# Patient Record
Sex: Female | Born: 1960 | Race: White | Hispanic: No | Smoking: Current every day smoker
Health system: Southern US, Community
[De-identification: ages and names within clinical notes are randomized; demographics above are authoritative.]

## PROBLEM LIST (undated history)

## (undated) DIAGNOSIS — I639 Cerebral infarction, unspecified: Secondary | ICD-10-CM

## (undated) DIAGNOSIS — E785 Hyperlipidemia, unspecified: Secondary | ICD-10-CM

## (undated) DIAGNOSIS — E119 Type 2 diabetes mellitus without complications: Secondary | ICD-10-CM

---

## 2010-04-25 ENCOUNTER — Encounter: Payer: Self-pay | Admitting: Family Medicine

## 2016-11-14 ENCOUNTER — Encounter (HOSPITAL_COMMUNITY): Payer: Self-pay | Admitting: Nurse Practitioner

## 2016-11-14 ENCOUNTER — Emergency Department (HOSPITAL_COMMUNITY): Payer: Self-pay

## 2016-11-14 ENCOUNTER — Other Ambulatory Visit: Payer: Self-pay

## 2016-11-14 ENCOUNTER — Emergency Department (HOSPITAL_COMMUNITY)
Admission: EM | Admit: 2016-11-14 | Discharge: 2016-11-14 | Discharge status: Against Medical Advice | Payer: Self-pay | Attending: Emergency Medicine | Admitting: Emergency Medicine

## 2016-11-14 DIAGNOSIS — R4781 Slurred speech: Secondary | ICD-10-CM

## 2016-11-14 DIAGNOSIS — I639 Cerebral infarction, unspecified: Secondary | ICD-10-CM | POA: Insufficient documentation

## 2016-11-14 DIAGNOSIS — F17210 Nicotine dependence, cigarettes, uncomplicated: Secondary | ICD-10-CM | POA: Insufficient documentation

## 2016-11-14 DIAGNOSIS — Z79899 Other long term (current) drug therapy: Secondary | ICD-10-CM | POA: Insufficient documentation

## 2016-11-14 DIAGNOSIS — I7774 Dissection of vertebral artery: Secondary | ICD-10-CM | POA: Insufficient documentation

## 2016-11-14 HISTORY — DX: Cerebral infarction, unspecified: I63.9

## 2016-11-14 LAB — PROTIME-INR
INR: 1.03
Prothrombin Time: 13.5 seconds (ref 11.4–15.2)

## 2016-11-14 LAB — COMPREHENSIVE METABOLIC PANEL
ALT: 16 U/L (ref 14–54)
AST: 18 U/L (ref 15–41)
Albumin: 4 g/dL (ref 3.5–5.0)
Alkaline Phosphatase: 79 U/L (ref 38–126)
Anion gap: 10 (ref 5–15)
BILIRUBIN TOTAL: 0.6 mg/dL (ref 0.3–1.2)
BUN: 13 mg/dL (ref 6–20)
CO2: 24 mmol/L (ref 22–32)
CREATININE: 0.63 mg/dL (ref 0.44–1.00)
Calcium: 9.1 mg/dL (ref 8.9–10.3)
Chloride: 106 mmol/L (ref 101–111)
GFR calc Af Amer: >60 mL/min (ref >60)
GLUCOSE: 115 mg/dL — AB (ref 65–99)
Potassium: 3.5 mmol/L (ref 3.5–5.1)
Sodium: 140 mmol/L (ref 135–145)
TOTAL PROTEIN: 7.6 g/dL (ref 6.5–8.1)

## 2016-11-14 LAB — DIFFERENTIAL
BASOS ABS: 0.1 10*3/uL (ref 0.0–0.1)
Basophils Relative: 0 %
Eosinophils Absolute: 0.1 10*3/uL (ref 0.0–0.7)
Eosinophils Relative: 0 %
LYMPHS ABS: 5 10*3/uL — AB (ref 0.7–4.0)
LYMPHS PCT: 28 %
MONOS PCT: 7 %
Monocytes Absolute: 1.2 10*3/uL — ABNORMAL HIGH (ref 0.1–1.0)
NEUTROS ABS: 11.7 10*3/uL — AB (ref 1.7–7.7)
Neutrophils Relative %: 65 %

## 2016-11-14 LAB — I-STAT CHEM 8, ED
BUN: 14 mg/dL (ref 6–20)
CREATININE: 0.6 mg/dL (ref 0.44–1.00)
Calcium, Ion: 1.02 mmol/L — ABNORMAL LOW (ref 1.15–1.40)
Chloride: 106 mmol/L (ref 101–111)
GLUCOSE: 115 mg/dL — AB (ref 65–99)
HCT: 44 % (ref 36.0–46.0)
HEMOGLOBIN: 15 g/dL (ref 12.0–15.0)
POTASSIUM: 3.6 mmol/L (ref 3.5–5.1)
Sodium: 142 mmol/L (ref 135–145)
TCO2: 25 mmol/L (ref 0–100)

## 2016-11-14 LAB — CBC
HEMATOCRIT: 43.6 % (ref 36.0–46.0)
HEMOGLOBIN: 15 g/dL (ref 12.0–15.0)
MCH: 30.4 pg (ref 26.0–34.0)
MCHC: 34.4 g/dL (ref 30.0–36.0)
MCV: 88.4 fL (ref 78.0–100.0)
Platelets: 242 10*3/uL (ref 150–400)
RBC: 4.93 MIL/uL (ref 3.87–5.11)
RDW: 13 % (ref 11.5–15.5)
WBC: 18 10*3/uL — ABNORMAL HIGH (ref 4.0–10.5)

## 2016-11-14 LAB — I-STAT TROPONIN, ED: TROPONIN I, POC: 0 ng/mL (ref 0.00–0.08)

## 2016-11-14 LAB — APTT: aPTT: 31 seconds (ref 24–36)

## 2016-11-14 MED ORDER — ENOXAPARIN SODIUM 150 MG/ML ~~LOC~~ SOLN: 1.0000 mg/kg | Freq: Two times a day (BID) | SUBCUTANEOUS | 0 refills | Status: DC

## 2016-11-14 MED ORDER — WARFARIN SODIUM 5 MG PO TABS: 5.0000 mg | ORAL_TABLET | Freq: Once | ORAL | 0 refills | Status: DC

## 2016-11-14 MED ORDER — IOPAMIDOL (ISOVUE-370) INJECTION 76%
INTRAVENOUS | Status: AC
  Administered 2016-11-14: 50 mL
  Filled 2016-11-14: qty 50

## 2016-11-14 MED ORDER — ENOXAPARIN SODIUM 100 MG/ML ~~LOC~~ SOLN
1.0000 mg/kg | Freq: Once | SUBCUTANEOUS | Status: AC
  Administered 2016-11-14: 85 mg via SUBCUTANEOUS
  Filled 2016-11-14: qty 1

## 2016-11-14 NOTE — ED Notes (Signed)
Dr. Criss AlvineGoldston ( EDP ) explained tests results , plan of care and admission plan to pt. and family . Pt. refused admission and requested to be discharged home during encounter.

## 2016-11-14 NOTE — ED Provider Notes (Signed)
[facility]-EMERGENCY DEPT Provider Note   CSN: [id number] Arrival date & time: 11/14/16  1107     History   Chief Complaint Chief Complaint  Patient presents with   Stroke Symptoms    HPI Alexandra Ward is a 56 y.o. female.  HPI Patient with history of tobacco use who presents with symptoms concerning for stroke. Patient was admitted to [facility] rocking him on Friday evening after being diagnosed with a stroke. She reports headache starting on Tuesday, and developed slurred speech on Friday at approximately 6 PM. She's had some intermittent tingling of the right side of her mouth. She recalls having a CT and an MRI that showed her stroke. However she became frustrated the following day that no further tests are being done, and she left AGAINST MEDICAL ADVICE. Her friends were concerned about her symptoms, and convinced her to come back to the emergency department this evening. She has had some intermittent slurred speech and right lip tingling, but denies any new symptoms. Past Medical History:  Diagnosis Date   Stroke [facility])     There are no active problems to display for this patient.   History reviewed. No pertinent surgical history.  OB History    No data available       Home Medications    Prior to Admission medications   Medication Sig Start Date End Date Taking? Authorizing Provider  FISH OIL-KRILL OIL PO Take 1 capsule by mouth daily.   Yes [provider]  ibuprofen (ADVIL,MOTRIN) 200 MG tablet Take 200 mg by mouth every 6 (six) hours as needed for moderate pain.   Yes [provider]  Multiple Vitamin (MULTIVITAMIN) tablet Take 1 tablet by mouth daily.   Yes [provider]  enoxaparin (LOVENOX) 150 MG/ML injection Inject 0.57 mLs (85 mg total) into the skin every 12 (twelve) hours. 11/14/16 11/21/16  Wynelle Cleveland, MD  warfarin (COUMADIN) 5 MG tablet Take 1 tablet (5 mg total) by mouth one time only at 6 PM. 11/14/16 11/21/16  Wynelle Cleveland, MD     Family History History reviewed. No pertinent family history.  Social History Social History  Substance Use Topics   Smoking status: Current Every Day Smoker    Types: Cigarettes   Smokeless tobacco: Never Used   Alcohol use No     Allergies   Hydrocodone; Other; and Tyloxapol   Review of Systems Review of Systems  Constitutional: Negative for chills and fever.  HENT: Negative for ear pain and sore throat.   Eyes: Negative for pain and visual disturbance.  Respiratory: Negative for cough and shortness of breath.   Cardiovascular: Negative for chest pain and palpitations.  Gastrointestinal: Negative for abdominal pain and vomiting.  Genitourinary: Negative for dysuria and hematuria.  Musculoskeletal: Negative for arthralgias and back pain.  Skin: Negative for color change and rash.  Neurological: Positive for speech difficulty, numbness and headaches. Negative for seizures and syncope.  All other systems reviewed and are negative.    Physical Exam Updated Vital Signs BP 108/61    Pulse 76    Temp 98.2 F (36.8 C)    Resp (!) 23    Ht 5\' 4"  (1.626 m)    Wt 86.2 kg (190 lb)    SpO2 95%    BMI 32.61 kg/m   Physical Exam  Constitutional: She is oriented to person, place, and time. She appears well-developed and well-nourished. No distress.  HENT:  Head: Normocephalic and atraumatic.  Eyes: Conjunctivae are normal.  Neck:  Neck supple.  Cardiovascular: Normal rate and regular rhythm.   No murmur heard. Pulmonary/Chest: Effort normal and breath sounds normal. No respiratory distress.  Abdominal: Soft. There is no tenderness.  Musculoskeletal: She exhibits no edema.  Neurological: She is alert and oriented to person, place, and time. She has normal strength. She displays a negative Romberg sign. Coordination and gait normal. GCS eye subscore is 4. GCS verbal subscore is 5. GCS motor subscore is 6.  ? Facial droop on right compared to left; patient reports history of  slanted smile and does not think there is a change  Skin: Skin is warm and dry.  Psychiatric: She has a normal mood and affect.  Nursing note and vitals reviewed.    ED Treatments / Results  Labs (all labs ordered are listed, but only abnormal results are displayed) Labs Reviewed  CBC - Abnormal; Notable for the following:       Result Value   WBC 18.0 (*)    All other components within normal limits  DIFFERENTIAL - Abnormal; Notable for the following:    Neutro Abs 11.7 (*)    Lymphs Abs 5.0 (*)    Monocytes Absolute 1.2 (*)    All other components within normal limits  COMPREHENSIVE METABOLIC PANEL - Abnormal; Notable for the following:    Glucose, Bld 115 (*)    All other components within normal limits  I-STAT CHEM 8, ED - Abnormal; Notable for the following:    Glucose, Bld 115 (*)    Calcium, Ion 1.02 (*)    All other components within normal limits  PROTIME-INR  APTT  I-STAT TROPONIN, ED    EKG  EKG Interpretation  Date/Time:  Monday November 14 2016 11:21:53 EDT Ventricular Rate:  81 PR Interval:  140 QRS Duration: 98 QT Interval:  404 QTC Calculation: 469 R Axis:   82 Text Interpretation:  Normal sinus rhythm nonspecific T wave changes No old tracing to compare Confirmed by Pricilla Loveless [phone]) on 11/14/2016 3:23:56 PM       Radiology Ct Angio Head W Or Wo Contrast  Result Date: 11/14/2016 CLINICAL DATA:  Recent stroke.  Right facial droop and lip numbness. EXAM: CT ANGIOGRAPHY HEAD AND NECK TECHNIQUE: Multidetector CT imaging of the head and neck was performed using the standard protocol during bolus administration of intravenous contrast. Multiplanar CT image reconstructions and MIPs were obtained to evaluate the vascular anatomy. Carotid stenosis measurements (when applicable) are obtained utilizing NASCET criteria, using the distal internal carotid diameter as the denominator. CONTRAST:  50 mL Isovue 370 COMPARISON:  None. FINDINGS: CT HEAD FINDINGS  Brain: There is focal hypoattenuation within the posterior left middle cerebral artery territory extending to the parietal cortex. No hemorrhage. No midline shift or other mass effect. There is nearby sulcal effacement. No hydrocephalus. No age advanced atrophy. Vascular: No hyperdense vessel or unexpected calcification. Skull: Normal visualized skull base, calvarium and extracranial soft tissues. Sinuses/Orbits: No sinus fluid levels or advanced mucosal thickening. No mastoid effusion. Normal orbits. CTA NECK FINDINGS Aortic arch: There is no aneurysm or dissection of the visualized ascending aorta or aortic arch. There is a normal variant aortic arch branching pattern with the brachiocephalic and left common carotid arteries sharing a common origin. The visualized proximal subclavian arteries are normal. Mild atherosclerotic calcification. Right carotid system: The right common carotid origin is widely patent. There is no common carotid or internal carotid artery dissection or aneurysm. Mild atherosclerotic calcification at the carotid bifurcation without hemodynamically significant  stenosis. Left carotid system: The left common carotid origin is widely patent. There is no common carotid or internal carotid artery dissection or aneurysm. Mild atherosclerotic calcification at the carotid bifurcation without hemodynamically significant stenosis. Vertebral arteries: The vertebral system is right dominant. The left vertebral artery is diminutive along its entire course and is non-opacified in its V4 segment proximal to the basilar confluence. The left transverse foramina are of normal caliber (arguing against congenital variation). The right vertebral artery is normal along its entire course. Skeleton: There is no bony spinal canal stenosis. No lytic or blastic lesions. Other neck: The nasopharynx is clear. The oropharynx and hypopharynx are normal. The epiglottis is normal. The supraglottic larynx, glottis and  subglottic larynx are normal. No retropharyngeal collection. The parapharyngeal spaces are preserved. The parotid and submandibular glands are normal. No sialolithiasis or salivary ductal dilatation. The thyroid gland is normal. There is no cervical lymphadenopathy. Upper chest: No pneumothorax or pleural effusion. No nodules or masses. Review of the MIP images confirms the above findings CTA HEAD FINDINGS Anterior circulation: --Intracranial internal carotid arteries: Approximately 50% narrowing of the distal cavernous segment of the right internal carotid artery. Minimal calcification the proximal left clinoid segment. Otherwise normal. --Anterior cerebral arteries: Normal. --Middle cerebral arteries: Moderate stenosis of the proximal left M2 segment superior division. Normal right MCA. --Posterior communicating arteries: Present bilaterally. Posterior circulation: --Posterior cerebral arteries: Bilateral fetal origins. Diminutive bilateral P1 segments. --Superior cerebellar arteries: Normal. --Basilar artery: Normal. --Anterior inferior cerebellar arteries: Normal. --Posterior inferior cerebellar arteries: Both posterior inferior cerebellar arteries arise from the perspective anterior inferior cerebellar arteries. Venous sinuses: As permitted by contrast timing, patent. Anatomic variants: Bilateral fetal origin posterior cerebral arteries. Bilateral shared AICA-PICA origins. Delayed phase: No parenchymal contrast enhancement. Review of the MIP images confirms the above findings IMPRESSION: 1. Focal hypoattenuation within the left parietal lobe, in the posterior left middle cerebral artery territory vs left MCA/PCA watershed, consistent with subacute infarct (5-10 days). No hemorrhage or mass effect. 2. Diffuse narrowing and attenuated enhancement of the left vertebral artery, suggesting acute vertebral artery dissection. The distal V4 segment is occluded. Of note, the the left PICA arises from a common origin  with the left AICA from the basilar artery and remains patent. 3. 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 Approximately 50% stenosis of the distal cavernous segment of the right internal carotid artery. Critical Value/emergent results were called by telephone at the time of interpretation on 11/14/2016 at 8:32 pm to Dr. Wynelle ClevelandKATHRYN Kaizlee Carlino , who verbally acknowledged these results. Electronically Signed   By: Deatra RobinsonKevin  Herman M.D.   On: 11/14/2016 20:37   Ct Angio Neck W And/or Wo Contrast  Result Date: 11/14/2016 CLINICAL DATA:  Recent stroke.  Right facial droop and lip numbness. EXAM: CT ANGIOGRAPHY HEAD AND NECK TECHNIQUE: Multidetector CT imaging of the head and neck was performed using the standard protocol during bolus administration of intravenous contrast. Multiplanar CT image reconstructions and MIPs were obtained to evaluate the vascular anatomy. Carotid stenosis measurements (when applicable) are obtained utilizing NASCET criteria, using the distal internal carotid diameter as the denominator. CONTRAST:  50 mL Isovue 370 COMPARISON:  None. FINDINGS: CT HEAD FINDINGS Brain: There is focal hypoattenuation within the posterior left middle cerebral artery territory extending to the parietal cortex. No hemorrhage. No midline shift or other mass effect. There is nearby sulcal effacement. No hydrocephalus. No age advanced atrophy. Vascular: No hyperdense vessel or unexpected calcification. Skull: Normal visualized skull  base, calvarium and extracranial soft tissues. Sinuses/Orbits: No sinus fluid levels or advanced mucosal thickening. No mastoid effusion. Normal orbits. CTA NECK FINDINGS Aortic arch: There is no aneurysm or dissection of the visualized ascending aorta or aortic arch. There is a normal variant aortic arch branching pattern with the brachiocephalic and left common carotid arteries sharing a common origin. The visualized proximal subclavian arteries are normal. Mild  atherosclerotic calcification. Right carotid system: The right common carotid origin is widely patent. There is no common carotid or internal carotid artery dissection or aneurysm. Mild atherosclerotic calcification at the carotid bifurcation without hemodynamically significant stenosis. Left carotid system: The left common carotid origin is widely patent. There is no common carotid or internal carotid artery dissection or aneurysm. Mild atherosclerotic calcification at the carotid bifurcation without hemodynamically significant stenosis. Vertebral arteries: The vertebral system is right dominant. The left vertebral artery is diminutive along its entire course and is non-opacified in its V4 segment proximal to the basilar confluence. The left transverse foramina are of normal caliber (arguing against congenital variation). The right vertebral artery is normal along its entire course. Skeleton: There is no bony spinal canal stenosis. No lytic or blastic lesions. Other neck: The nasopharynx is clear. The oropharynx and hypopharynx are normal. The epiglottis is normal. The supraglottic larynx, glottis and subglottic larynx are normal. No retropharyngeal collection. The parapharyngeal spaces are preserved. The parotid and submandibular glands are normal. No sialolithiasis or salivary ductal dilatation. The thyroid gland is normal. There is no cervical lymphadenopathy. Upper chest: No pneumothorax or pleural effusion. No nodules or masses. Review of the MIP images confirms the above findings CTA HEAD FINDINGS Anterior circulation: --Intracranial internal carotid arteries: Approximately 50% narrowing of the distal cavernous segment of the right internal carotid artery. Minimal calcification the proximal left clinoid segment. Otherwise normal. --Anterior cerebral arteries: Normal. --Middle cerebral arteries: Moderate stenosis of the proximal left M2 segment superior division. Normal right MCA. --Posterior communicating  arteries: Present bilaterally. Posterior circulation: --Posterior cerebral arteries: Bilateral fetal origins. Diminutive bilateral P1 segments. --Superior cerebellar arteries: Normal. --Basilar artery: Normal. --Anterior inferior cerebellar arteries: Normal. --Posterior inferior cerebellar arteries: Both posterior inferior cerebellar arteries arise from the perspective anterior inferior cerebellar arteries. Venous sinuses: As permitted by contrast timing, patent. Anatomic variants: Bilateral fetal origin posterior cerebral arteries. Bilateral shared AICA-PICA origins. Delayed phase: No parenchymal contrast enhancement. Review of the MIP images confirms the above findings IMPRESSION: 1. Focal hypoattenuation within the left parietal lobe, in the posterior left middle cerebral artery territory vs left MCA/PCA watershed, consistent with subacute infarct (5-10 days). No hemorrhage or mass effect. 2. Diffuse narrowing and attenuated enhancement of the left vertebral artery, suggesting acute vertebral artery dissection. The distal V4 segment is occluded. Of note, the the left PICA arises from a common origin with the left AICA from the basilar artery and remains patent. 3. 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 Approximately 50% stenosis of the distal cavernous segment of the right internal carotid artery. Critical Value/emergent results were called by telephone at the time of interpretation on 11/14/2016 at 8:32 pm to Dr. Wynelle Cleveland , who verbally acknowledged these results. Electronically Signed   By: Deatra Robinson M.D.   On: 11/14/2016 20:37    Procedures Procedures (including critical care time)  Medications Ordered in ED Medications  iopamidol (ISOVUE-370) 76 % injection (50 mLs  Contrast Given 11/14/16 1923)  enoxaparin (LOVENOX) injection 85 mg (85 mg Subcutaneous Given 11/14/16 2123)  Initial Impression / Assessment and Plan / ED Course  I have reviewed the  triage vital signs and the nursing notes.  Pertinent labs & imaging results that were available during my care of the patient were reviewed by me and considered in my medical decision making (see chart for details).    Patient is a 56 year old female who presents with recent stroke. Patient arrived hemodynamically stable, in no acute distress. Exam as above.  Medical records from [place] received, CT head significant for left recent nonhemorrhagic watershed infarct at the junction of the left posterior parietal and occipital lobes, likely representing stigmata of a hypotensive cerebral infarction. Per discharge summary, patient was admitted from the emergency Department, however left AMA prior to admission. It does not appear that MRI or any ultrasounds were performed.  Discussed with neurology, recommending Additional imaging with CTA of the head and neck, MRI, and admission for additional stroke workup. Patient very anxious on exam, from my initial interview expressed that she did not want to be admitted to the hospital. She had to be coaxed into getting IV for her head imaging. When discussed the results of her left vertebral artery dissection and acute stroke, patient became very upset, requesting to leave AMA. Myself, my attending, and the neurologist on-call spent prolonged time discussing these results with the patient, the risk of leaving without the rest of her workup being completed, including risk of worsening neurological symptoms and even death. Patient was able to verbalize understanding of this risk. She did agree to wait for Lovenox training. She received prescription for 1 week of Lovenox bridge and Coumadin for her vertebral artery dissection. I discussed strict return precautions, follow-up with her primary care doctor this week, and reminded her she could always come back to the emergency room to be admitted if she changed her mind.  Patient signed out AGAINST MEDICAL  ADVICE.  Patient and plan of care discussed with Attending physician, Dr. Criss Alvine.   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

## 2016-11-14 NOTE — Consult Note (Addendum)
Neurology Consultation Reason for Consult: Stroke Referring Physician: Marcina Millard  CC: Dysarthria   HPI: Alexandra Ward is a 56 y.o. female overwieght female, smoker with who likely has HTN comes to the ER after having developed slurred speech, facial droop on Friday. Went to OSH and left AMA as she was frustrated no further tests were being done. Apparently CT head at OSH showed subacute stroke.  She comes back as symptoms not improved. However patient is now refusing tests/admission.   LKW: 11/11/16 tpa given?: no, outside window Premorbid modified rankin scale: Baseline 0, now 1    ROS: A 14 point ROS was performed and is negative except as noted in the HPI.  Past Medical History:  Diagnosis Date   Stroke [facility])      History reviewed. No pertinent family history.   Social History:  reports that she has been smoking Cigarettes.  She has never used smokeless tobacco. She reports that she does not drink alcohol or use drugs.   Exam: Current vital signs: BP 108/61    Pulse 76    Temp 98.2 F (36.8 C)    Resp (!) 23    Ht 5\' 4"  (1.626 m)    Wt 86.2 kg (190 lb)    SpO2 95%    BMI 32.61 kg/m  Vital signs in last 24 hours: Temp:  [98.2 F (36.8 C)-98.4 F (36.9 C)] 98.2 F (36.8 C) (08/13 2007) Pulse Rate:  [64-88] 76 (08/13 2015) Resp:  [12-23] 23 (08/13 2015) BP: (97-165)/(58-85) 108/61 (08/13 2015) SpO2:  [92 %-99 %] 95 % (08/13 2015) Weight:  [86.2 kg (190 lb)] 86.2 kg (190 lb) (08/13 1117)   Physical Exam  Constitutional: Appears well-developed and well-nourished.  Psych: Affect appropriate to situation Eyes: No scleral injection HENT: No OP obstrucion Head: Normocephalic.  Cardiovascular: Normal rate and regular rhythm.  Respiratory: Effort normal and breath sounds normal to anterior ascultation GI: Soft.  No distension. There is no tenderness.  Skin: WDI  Neuro: Mental Status: Patient is awake, alert, oriented to person, place, month, year, and  situation. Patient is able to give a clear and coherent history No signs of aphasia or neglect, however patient is dysarthric  Cranial Nerves: II: Visual Fields are full. Pupils are equal, round, and reactive to light.   III,IV, VI: EOMI without ptosis or diploplia.  V: Facial sensation is symmetric to temperature VII: R NF flattening VIII: hearing is intact to voice X: Uvula elevates symmetrically XI: Shoulder shrug is symmetric. XII: tongue is midline without atrophy or fasciculations.  Motor: Tone is normal. Bulk is normal. 5/5 strength was present in all four extremities.  Sensory: Sensation is symmetric to light touch and temperature in the arms and legs. Deep Tendon Reflexes: 2+ and symmetric in the biceps and patellae.  Plantars: Toes are downgoing bilaterally.  Cerebellar: FNF and HKS are intact bilaterally  ASSESSMENT AND PLAN  Subacute Ischemic Stroke   58 y F overwieght female, smoker with who likely has HTN comes to the ER after having developed slurred speech, facial droop on Friday. Went to OSH and left AMA.  Recommend admission for stroke workup. However patient is now refusing tests/admission. Spent over 20 min counseling on need to diagnose mechanism of stroke for targeted stroke prevention therapy. Suspect underlying family issues.   I will sign out care to Dr Otelia Limes.    ##################################################################  This is addendum from yesterday's note.  I was very sad to see that patient left AMA after obtaining  CTA which was showing possible left vertebral dissection and left M2 stenosis. Patient is at a very high risk for recurrent stroke. Hopefully she comes to her senses and returns to hospital.  My colleague started patient on warfarin and lovenox for vertebral dissection, if she is taking these medications, she absolutely needs to follow up with PCP. We will be very happy to take care of her if she returns.

## 2016-11-14 NOTE — ED Notes (Signed)
Patient refused discharge vital signs to be taken at discharge .

## 2016-11-14 NOTE — ED Triage Notes (Signed)
Pt presents with c/o stroke symptoms. [facility] for a [facility]. She left against medical advice on saturday because she felt that she was not receiving any care for her stroke. She decided to return to the hospital for care today because she continues to have slurred speech and numbness in her lips. She feels that her symptoms are improving but slowly and would like more help in managing her symptoms. She denies any weakness, pain, confusion.

## 2016-11-14 NOTE — ED Provider Notes (Signed)
I saw and evaluated the patient, reviewed the resident's note and I agree with the findings and plan.   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 She is overall well appearing. However she is refusing to be admitted. She does understand risks of not being admitted with close monitoring and treatment such as death, permanent neurologic disability. She seems to understand the severity of these tests but refuses to come in. She also refused to explain why she wants to leave. However she is able to get up and walk and is aiming toward. She appears awake, alert, not intoxicated, and capable of medical decision-making. I discussed that she can return at any time to be reevaluated and admitted. Discussed with neurology, Dr. Otelia LimesLindzen, who originally recommended heparin and warfarin and now Lovenox and warfarin since she is leaving. I discussed side effects of these medicines, especially bleeding. She has a PCP and I discussed the strict importance of taking both medicines and following up with her PCP in the next 1-2 days. Discussed that if this is not probably monitored a can cause severe bleeding or poor effect. She understands this. Family at the bedside understands this. Multiple times she was offered admission and further treatment and she declined. Discussed return precautions.   Pricilla LovelessGoldston, Iyahna Obriant, MD 11/15/16 Marlyne Beards0002

## 2016-11-14 NOTE — ED Notes (Signed)
Pt states she will alolow me to place IV,

## 2016-11-14 NOTE — ED Notes (Signed)
Pt refuses IV. Dr. Criss AlvineGoldston aware.

## 2016-11-14 NOTE — ED Notes (Signed)
Patient transported to CT scan .

## 2016-11-14 NOTE — ED Notes (Signed)
Neuro at bedsdie

## 2016-11-14 NOTE — ED Notes (Signed)
IV held after discujss with Dr. Criss AlvineGoldston.

## 2016-11-14 NOTE — Discharge Instructions (Signed)
Your work up was significant for left vertebral artery dissection and acute stroke. Please follow up with PCM as instructed for follow up on your coumadin and INR dosing. You have only been given one week of treatment because of the importance of close follow up. Please return to ED with any new or worsening symptoms.   Understand by leaving AGAINST MEDICAL ADVICE, you accept the risk of worsening symptoms or even death.

## 2016-11-14 NOTE — ED Notes (Signed)
Pt. signed AMA form , waiting for her Lovenox from pharmacy .

## 2016-11-23 ENCOUNTER — Inpatient Hospital Stay (HOSPITAL_COMMUNITY)
Admission: EM | Admit: 2016-11-23 | Discharge: 2016-11-24 | DRG: 064 | Discharge status: Against Medical Advice | Payer: Self-pay | Attending: Internal Medicine | Admitting: Internal Medicine

## 2016-11-23 ENCOUNTER — Encounter (HOSPITAL_COMMUNITY): Payer: Self-pay | Admitting: *Deleted

## 2016-11-23 ENCOUNTER — Emergency Department (HOSPITAL_COMMUNITY): Payer: Self-pay

## 2016-11-23 DIAGNOSIS — R739 Hyperglycemia, unspecified: Secondary | ICD-10-CM | POA: Diagnosis present

## 2016-11-23 DIAGNOSIS — I639 Cerebral infarction, unspecified: Secondary | ICD-10-CM

## 2016-11-23 DIAGNOSIS — F17210 Nicotine dependence, cigarettes, uncomplicated: Secondary | ICD-10-CM | POA: Diagnosis present

## 2016-11-23 DIAGNOSIS — R4701 Aphasia: Secondary | ICD-10-CM | POA: Diagnosis present

## 2016-11-23 DIAGNOSIS — Z720 Tobacco use: Secondary | ICD-10-CM | POA: Diagnosis present

## 2016-11-23 DIAGNOSIS — I7774 Dissection of vertebral artery: Secondary | ICD-10-CM

## 2016-11-23 DIAGNOSIS — Z8249 Family history of ischemic heart disease and other diseases of the circulatory system: Secondary | ICD-10-CM

## 2016-11-23 DIAGNOSIS — I63512 Cerebral infarction due to unspecified occlusion or stenosis of left middle cerebral artery: Secondary | ICD-10-CM

## 2016-11-23 DIAGNOSIS — R29700 NIHSS score 0: Secondary | ICD-10-CM | POA: Diagnosis present

## 2016-11-23 DIAGNOSIS — I6521 Occlusion and stenosis of right carotid artery: Secondary | ICD-10-CM | POA: Diagnosis present

## 2016-11-23 LAB — CBC WITH DIFFERENTIAL/PLATELET
BASOS PCT: 0 %
Basophils Absolute: 0.1 10*3/uL (ref 0.0–0.1)
EOS ABS: 0.1 10*3/uL (ref 0.0–0.7)
EOS PCT: 1 %
HCT: 40 % (ref 36.0–46.0)
HEMOGLOBIN: 13.6 g/dL (ref 12.0–15.0)
LYMPHS ABS: 4.1 10*3/uL — AB (ref 0.7–4.0)
Lymphocytes Relative: 28 %
MCH: 30 pg (ref 26.0–34.0)
MCHC: 34 g/dL (ref 30.0–36.0)
MCV: 88.3 fL (ref 78.0–100.0)
MONO ABS: 1.1 10*3/uL — AB (ref 0.1–1.0)
MONOS PCT: 8 %
NEUTROS PCT: 63 %
Neutro Abs: 9.3 10*3/uL — ABNORMAL HIGH (ref 1.7–7.7)
Platelets: 252 10*3/uL (ref 150–400)
RBC: 4.53 MIL/uL (ref 3.87–5.11)
RDW: 13.2 % (ref 11.5–15.5)
WBC: 14.7 10*3/uL — ABNORMAL HIGH (ref 4.0–10.5)

## 2016-11-23 LAB — BASIC METABOLIC PANEL
Anion gap: 6 (ref 5–15)
BUN: 11 mg/dL (ref 6–20)
CALCIUM: 9 mg/dL (ref 8.9–10.3)
CHLORIDE: 107 mmol/L (ref 101–111)
CO2: 26 mmol/L (ref 22–32)
CREATININE: 0.56 mg/dL (ref 0.44–1.00)
GFR calc non Af Amer: >60 mL/min (ref >60)
GLUCOSE: 164 mg/dL — AB (ref 65–99)
Potassium: 3.5 mmol/L (ref 3.5–5.1)
Sodium: 139 mmol/L (ref 135–145)

## 2016-11-23 LAB — PROTIME-INR
INR: 1.05
PROTHROMBIN TIME: 13.7 s (ref 11.4–15.2)

## 2016-11-23 LAB — TSH: TSH: 3.437 u[IU]/mL (ref 0.350–4.500)

## 2016-11-23 MED ORDER — SENNOSIDES-DOCUSATE SODIUM 8.6-50 MG PO TABS
1.0000 | ORAL_TABLET | Freq: Every evening | ORAL | Status: DC | PRN
  Filled 2016-11-23: qty 1

## 2016-11-23 MED ORDER — ACETAMINOPHEN 650 MG RE SUPP: 650.0000 mg | RECTAL | Status: DC | PRN

## 2016-11-23 MED ORDER — SODIUM CHLORIDE 0.9 % IV SOLN
INTRAVENOUS | Status: DC
  Administered 2016-11-23: 50 mL/h via INTRAVENOUS

## 2016-11-23 MED ORDER — ACETAMINOPHEN 325 MG PO TABS: 650.0000 mg | ORAL_TABLET | ORAL | Status: DC | PRN

## 2016-11-23 MED ORDER — GADOBENATE DIMEGLUMINE 529 MG/ML IV SOLN
18.0000 mL | Freq: Once | INTRAVENOUS | Status: AC | PRN
  Administered 2016-11-23: 18 mL via INTRAVENOUS

## 2016-11-23 MED ORDER — ACETAMINOPHEN 160 MG/5ML PO SOLN: 650.0000 mg | ORAL | Status: DC | PRN

## 2016-11-23 MED ORDER — HEPARIN (PORCINE) IN NACL 100-0.45 UNIT/ML-% IJ SOLN
900.0000 [IU]/h | INTRAMUSCULAR | Status: DC
  Administered 2016-11-23: 900 [IU]/h via INTRAVENOUS
  Filled 2016-11-23: qty 250

## 2016-11-23 MED ORDER — STROKE: EARLY STAGES OF RECOVERY BOOK
Freq: Once | Status: AC
  Administered 2016-11-23: 19:00:00
  Filled 2016-11-23: qty 1

## 2016-11-23 MED ORDER — NICOTINE 21 MG/24HR TD PT24: 21.0000 mg | MEDICATED_PATCH | Freq: Every day | TRANSDERMAL | Status: DC

## 2016-11-23 NOTE — ED Notes (Signed)
Patient transported to MRI on stretcher.

## 2016-11-23 NOTE — ED Notes (Signed)
Attempted to call report.

## 2016-11-23 NOTE — Consult Note (Signed)
Neurology Consultation  Reason for Consult: Vertebral artery dissection Referring Physician: Dr. Judd Lien  CC: Slurred speech, facial droop, confusion  History is obtained from: Patient, chart  HPI: Alexandra Ward is a 56 y.o. female who has a past medical history of a recent stroke in the left parietal lobe, and CT angiogram done at that point suggestive of left vertebral artery dissection, who was seen here at [facility] last week and left the hospital without getting a complete workup done, on Lovenox as a bridge to Coumadin for her dissection, who now really presents to the emergency room with complaints of slurred speech, facial droop and some confusion. She reports that she had symptoms of slurred speech and some facial droop that started sometime around Friday, 11/11/2016. She went to an outside hospital a week after the symptom onset, where a CT scan of the head was done and she was told that she possibly has a stroke and she needs workup. She left from that hospital with some frustration of nothing being done at the time. She comes to [facility] emergency room last week, and was seen by Dr. Laurence Slate, who got a CTA of the head and neck done which revealed a left vertebral artery dissection and as well as moderate left M2 superior division stenosis, and 50% stenosis of the distal cavernous segment of the right ICA. He recommended that she stay for a completed stroke workup as well as start anticoagulation in the hospital. She decided not to stay in the hospital. She was discharged from the ER with Lovenox as bridge to Coumadin. She was asked to come back after 7 days for an INR check, which she never made 2. Today she comes in because she has been feeling worse in terms of her slurred speech and also has been feeling a little confused at times.   LKW: 11/11/2016 tpa given?: no, outside time window Premorbid modified Rankin scale (mRS): 0  ROS: A 14 point ROS was performed and is negative except as  noted in the HPI.    Past Medical History:  Diagnosis Date   Stroke [facility])      No family history on file.    Social History:   reports that she has been smoking Cigarettes.  She has never used smokeless tobacco. She reports that she does not drink alcohol or use drugs. Denies alcohol use. Denies illicit drug use. Smokes 1 pack of cigarette a day.  Medications  Current Facility-Administered Medications:    heparin ADULT infusion 100 units/mL (25000 units/274mL sodium chloride 0.45%), 900 Units/hr, Intravenous, Continuous, Rumbarger, Faye Ramsay, RPH  Current Outpatient Prescriptions:    enoxaparin (LOVENOX) 150 MG/ML injection, Inject 0.57 mLs (85 mg total) into the skin every 12 (twelve) hours. (Patient not taking: Reported on 11/23/2016), Disp: 8 mL, Rfl: 0   warfarin (COUMADIN) 5 MG tablet, Take 1 tablet (5 mg total) by mouth one time only at 6 PM. (Patient not taking: Reported on 11/23/2016), Disp: 7 tablet, Rfl: 0  Exam: Current vital signs: BP 131/71 (BP Location: Right Arm)    Pulse 66    Temp 98.5 F (36.9 C) (Oral)    Resp 20    Ht 5\' 4"  (1.626 m)    Wt 86.2 kg (190 lb 0.6 oz)    SpO2 95%    BMI 32.62 kg/m  Vital signs in last 24 hours: Temp:  [98.5 F (36.9 C)] 98.5 F (36.9 C) (08/22 1056) Pulse Rate:  [66-80] 66 (08/22 1323) Resp:  [  18-20] 20 (08/22 1323) BP: (117-131)/(71) 131/71 (08/22 1323) SpO2:  [95 %] 95 % (08/22 1323) Weight:  [86.2 kg (190 lb 0.6 oz)] 86.2 kg (190 lb 0.6 oz) (08/22 1300)  GENERAL: Awake, alert in NAD HEENT: - Normocephalic and atraumatic, dry mm, no LN++, no Thyromegally LUNGS - Clear to auscultation bilaterally with no wheezes CV - S1S2 RRR, no m/r/g, equal pulses bilaterally. ABDOMEN - Soft, nontender, nondistended with normoactive BS Ext: warm, well perfused, intact peripheral pulses, no edema  NEURO:  Mental Status: AA&Ox3  Language: speech is clear.  Naming, repetition, fluency, and comprehension intact. Cranial Nerves:  PERRL__3_mm/brisk. EOMI, visual fields full, no facial asymmetry, facial sensation intact, hearing intact, tongue/uvula/soft palate midline, normal sternocleidomastoid and trapezius muscle strength. No evidence of tongue atrophy or fibrillations Motor: 5/5 all over Tone: is normal and bulk is normal Sensation- Intact to light touch bilaterally Coordination: FTN intact bilaterally, no ataxia in BLE. Gait- deferred  NIHSS  0   Labs I have reviewed labs in epic and the results pertinent to this consultation are:   CBC    Component Value Date/Time   WBC 18.0 (H) 11/14/2016 1121   RBC 4.93 11/14/2016 1121   HGB 15.0 11/14/2016 1206   HCT 44.0 11/14/2016 1206   PLT 242 11/14/2016 1121   MCV 88.4 11/14/2016 1121   MCH 30.4 11/14/2016 1121   MCHC 34.4 11/14/2016 1121   RDW 13.0 11/14/2016 1121   LYMPHSABS 5.0 (H) 11/14/2016 1121   MONOABS 1.2 (H) 11/14/2016 1121   EOSABS 0.1 11/14/2016 1121   BASOSABS 0.1 11/14/2016 1121    CMP     Component Value Date/Time   NA 142 11/14/2016 1206   K 3.6 11/14/2016 1206   CL 106 11/14/2016 1206   CO2 24 11/14/2016 1121   GLUCOSE 115 (H) 11/14/2016 1206   BUN 14 11/14/2016 1206   CREATININE 0.60 11/14/2016 1206   CALCIUM 9.1 11/14/2016 1121   PROT 7.6 11/14/2016 1121   ALBUMIN 4.0 11/14/2016 1121   AST 18 11/14/2016 1121   ALT 16 11/14/2016 1121   ALKPHOS 79 11/14/2016 1121   BILITOT 0.6 11/14/2016 1121   GFRNONAA >60 11/14/2016 1121   GFRAA >60 11/14/2016 1121    Lipid Panel  No results found for: CHOL, TRIG, HDL, CHOLHDL, VLDL, LDLCALC, LDLDIRECT   Imaging I have reviewed the images obtained:  CT-scan of the brain - Performed on 11/14/2016 shows hypodensity in the left parietal lobe CTA of the head and neck shows diffuse narrowing and decreased enhancement of the left vertebral artery, suggestive of acute dissection. This was before segment is occluded. Also seen is moderate stenosis of the superior division of the left MCA M2  branch. Moderate stenosis 50% of distal cavernous segment of the right internal carotid artery is well.  MRI examination of the brain-pending  Assessment:  56 year old woman with a past medical history of a recent stroke 2 weeks ago in the left parietal lobe and a left vertebral artery dissection seen on CTA, who signed out AGAINST MEDICAL ADVICE last week, returns with similar symptoms of dysarthria and facial droop, that have now essentially resolved. The new symptoms that brought her in more of most concern was some confusion. On my exam, her NIH stroke scale is 0. Next line her CTA is suggestive of left vertebral artery dissection. Her CT shows a established left parietal stroke. She was recommended to go on anti-coag ration with warfarin, with Lovenox as bridge. She took the lowinjections  and the warfarin for 7 days and then discontinued it. Currently she has normal PT and INR   Impression: Acute ischemic stroke Left vertebral artery dissection  Recommendations: -Recommend anti-coagulation with a heparin drip, no bolus PTT 55-70 because of fluctuating symptoms. -Discharge on Lovenox with Coumadin bridge. -Echo -telemetric -MRA head and neck -He would've an A1c and fasting lipid panel -Frequent neuro checks -MRI brain without contrast.  Stroke team will follow with you in the morning.  Milon Dikes, MD [facility] [phone]  If 7pm to 7am, please call on call as listed on AMION.

## 2016-11-23 NOTE — Progress Notes (Signed)
ANTICOAGULATION CONSULT NOTE - Initial Consult  Pharmacy Consult for heparin Indication: stroke  Allergies  Allergen Reactions   Hydrocodone Nausea And Vomiting   Other Other (See Comments)    States all pain medications make her very ill   Tyloxapol Nausea And Vomiting    Patient Measurements: Height: 5\' 4"  (162.6 cm) Weight: 190 lb 0.6 oz (86.2 kg) IBW/kg (Calculated) : 54.7 Heparin Dosing Weight: 73.7kg  Vital Signs: Temp: 98.5 F (36.9 C) (08/22 1056) Temp Source: Oral (08/22 1056) BP: 131/71 (08/22 1323) Pulse Rate: 66 (08/22 1323)  Labs: No results for input(s): HGB, HCT, PLT, APTT, LABPROT, INR, HEPARINUNFRC, HEPRLOWMOCWT, CREATININE, CKTOTAL, CKMB, TROPONINI in the last 72 hours.  Estimated Creatinine Clearance: 83.4 mL/min (by C-G formula based on SCr of 0.6 mg/dL).   Medical History: Past Medical History:  Diagnosis Date   Stroke [facility])     Medications:  Infusions:   heparin      Assessment: 69 yof presented to the ED with facial droop and slurred speech. She was recently diagnosed with L vertebral artery dissection with L sided parietl occipital stroke. Baseline CBC is pending but recently WNL. She was prescribed coumadin and warfarin the time of her stroke diagnosis but has not taken.   Goal of Therapy:  Heparin level 0.3-0.5 units/ml Monitor platelets by anticoagulation protocol: Yes   Plan:  Heparin gtt 900 units/hr - NO BOLUS Check an 8 hr heparin level Daily heparin level and CBC  Alexandra Ward, Alexandra Ward 11/23/2016,1:30 PM

## 2016-11-23 NOTE — Progress Notes (Signed)
Pt on telemetry monitoring. No noted distress.

## 2016-11-23 NOTE — H&P (Addendum)
History and physical                                                      Alexandra Ward  BTY:[id number]  DOB: 10-19-1960  DOA: 11/23/2016  PCP: Patient, No Pcp Per      Requesting physician: Dr Judd Lien  Reason for consultation: Acute stroke   History of Present llness   Alexandra Ward is an 56 y.o. female who was admitted to [facility] about 2 weeks back (11/11/2016) for slurred speech and some confusion along with mild unsteady gait. She had a head CT done there which was negative. She was not happy with the care provided there so she decided to leave the hospital the next day. She then came to [facility] ED 3 days later since she had persistent slurred speech and some confusion.  CTA of the head and neck was done showing  a left vertebral artery dissection and as well as moderate left M2 superior division stenosis, and 50% stenosis of the distal cavernous segment of the right ICA. She was recommended to stay in the hospital to complete stroke workup and start anticoagulation but signed out AMA. She was discharged on Lovenox bridging with Coumadin and instructed to return to the ED for an INR check but she never returned. Patient came to the ED today that she has persistent slurred speech and feeling somewhat confused occasionally. She denies any headache, blurred vision, dizziness, nausea, vomiting, weakness or numbness of her limbs or gait instability. No prior symptoms. No new medications. (Except for Lovenox and Coumadin).  In the ED vitals were stable. Blood work showed mild leukocytosis, normal chemistry and mildly elevated blood glucose.  Neuro hospitalist was consulted and recommended MRI of the brain, MRA head and neck. She was started on IV heparin and plan to be admitted to complete stroke workup.   Review Of Systems     As outlined in history of present  illness. 12 point review of systems otherwise negative.  No known past medical history  No known past surgical history   Social History   Social History  Substance Use Topics   Smoking status: Current Every Day Smoker    Types: Cigarettes   Smokeless tobacco: Never Used   Alcohol use No     Family History   Reports family history of heart disease on mother's side.  Medications   Prior to Admission medications   Medication Sig Start Date End Date Taking? Authorizing Provider  enoxaparin (LOVENOX) 150 MG/ML injection Inject 0.57 mLs (85 mg total) into the skin every 12 (twelve) hours. Patient not taking: Reported on 11/23/2016 11/14/16 11/21/16  Marcina Millard,  Samara Deist, MD  warfarin (COUMADIN) 5 MG tablet Take 1 tablet (5 mg total) by mouth one time only at 6 PM. Patient not taking: Reported on 11/23/2016 11/14/16 11/21/16  Wynelle Cleveland, MD    Anti-infectives    None      Scheduled Meds: Continuous Infusions:  heparin 900 Units/hr (11/23/16 1411)   PRN Meds:.  Allergies  Allergen Reactions   Hydrocodone Nausea And Vomiting   Other Other (See Comments)    States all pain medications make her very ill   Tyloxapol Nausea And Vomiting    Objective   Physical Exam  Vitals  Blood pressure (!) 151/68, pulse 79, temperature 98.5 F (36.9 C), temperature source Oral, resp. rate 16, height 5\' 4"  (1.626 m), weight 86.2 kg (190 lb 0.6 oz), SpO2 95 %.   General: Middle aged female lying in bed, appears irritable HEENT: No pallor, moist mucosa, supple neck,  Chest: Clear to auscultation bilaterally   CVS: Normal S1 and S2, no murmurs GI: Soft, nondistended, nontender Musculoskeletal :Warm, no edema CNS: Alert and oriented, normal strength and tone in all extremities, cranial nerves 2-12 intact, no facial droop or slurred speech  Data   CBC  Recent Labs Lab 11/23/16 1148  WBC 14.7*  HGB 13.6  HCT 40.0  PLT 252  MCV 88.3  MCH 30.0  MCHC 34.0  RDW 13.2   LYMPHSABS 4.1*  MONOABS 1.1*  EOSABS 0.1  BASOSABS 0.1   ------------------------------------------------------------------------------------------------------------------  Chemistries   Recent Labs Lab 11/23/16 1148  NA 139  K 3.5  CL 107  CO2 26  GLUCOSE 164*  BUN 11  CREATININE 0.56  CALCIUM 9.0   ------------------------------------------------------------------------------------------------------------------ estimated creatinine clearance is 83.4 mL/min (by C-G formula based on SCr of 0.56 mg/dL). ------------------------------------------------------------------------------------------------------------------ No results for input(s): TSH, T4TOTAL, T3FREE, THYROIDAB in the last 72 hours.  Invalid input(s): FREET3   Coagulation profile  Recent Labs Lab 11/23/16 1148  INR 1.05   ------------------------------------------------------------------------------------------------------------------- No results for input(s): DDIMER in the last 72 hours. -------------------------------------------------------------------------------------------------------------------  Cardiac Enzymes No results for input(s): CKMB, TROPONINI, MYOGLOBIN in the last 168 hours.  Invalid input(s): CK ------------------------------------------------------------------------------------------------------------------ Invalid input(s): POCBNP   ---------------------------------------------------------------------------------------------------------------  Urinalysis No results found for: COLORURINE, APPEARANCEUR, LABSPEC, PHURINE, GLUCOSEU, HGBUR, BILIRUBINUR, KETONESUR, PROTEINUR, UROBILINOGEN, NITRITE, LEUKOCYTESUR   Imaging    No results found.  EKG: Pending  Assessment & Paln   Acute/subacute ischemic stroke Also findings of left vertebral artery dissection on recent CT Admit to telemetry. Follow MRI brain/MRA head and neck. Started on IV heparin. 2-D echo, PT/OT eval, SLP  eval. Check lipid panel, hemoglobin A 1C. Add Lipitor 80 mg daily. Check TSH. Allow permissive blood pressure. Continue neuro checks.  Stroke team to evaluate in the morning  Tobacco abuse Counseled on cessation. Order nicotine patch.  Patient had signed out from the hospital twice in the past 2 weeks and threatened to sign out AMA again this afternoon. Finally decided to stay back. She is at high risk for leaving AMA.   Family Communication: friend at bedside  Consults: Neurology   Eddie North M.D on 11/23/2016 at 5:46 PM  Between 7am to 7pm - Pager - [phone]. After 7pm go to [email or web address] - password [email or web address]  [facility]  - Office  [phone]   Thank you for the consult, we will follow the patient with you in the Hospital.

## 2016-11-23 NOTE — ED Triage Notes (Signed)
Pt states she had a diagnosed stroke on 8/9 at [facility] and was seen here as well for slurred speech and facial, droop on 8/14 and left AMA per pt. Pt arrives here today for same symptoms of slurred speech and facial droop.

## 2016-11-23 NOTE — Progress Notes (Signed)
Patient refused to have dinamap blood pressure cuff for vital signs. Ilean Skill LPN

## 2016-11-23 NOTE — ED Provider Notes (Signed)
[facility]-EMERGENCY DEPT Provider Note   CSN: [id number] Arrival date & time: 11/23/16  1014     History   Chief Complaint Chief Complaint  Patient presents with   Aphasia    HPI Ariela Mochizuki is a 56 y.o. female.  Patient is a 28 rolled female who presents for evaluation of dizziness and difficulty speaking. She was seen here one week ago with similar symptoms and was found to have had a stroke. There was also the possibility of a vertebral artery dissection. She was to be admitted, however left AGAINST MEDICAL ADVICE. She returns today desiring completion of her workup. Her symptoms persist, however to somewhat a lesser degree. She still feels dizzy and feels as though she is having difficulty speaking. She denies any weakness of her arm or leg. She denies any visual disturbances.   The history is provided by the patient.    Past Medical History:  Diagnosis Date   Stroke [facility])     Patient Active Problem List   Diagnosis Date Noted   Acute ischemic stroke (HCC) 11/23/2016    No past surgical history on file.  OB History    No data available       Home Medications    Prior to Admission medications   Medication Sig Start Date End Date Taking? Authorizing Provider  enoxaparin (LOVENOX) 150 MG/ML injection Inject 0.57 mLs (85 mg total) into the skin every 12 (twelve) hours. Patient not taking: Reported on 11/23/2016 11/14/16 11/21/16  Wynelle Cleveland, MD  warfarin (COUMADIN) 5 MG tablet Take 1 tablet (5 mg total) by mouth one time only at 6 PM. Patient not taking: Reported on 11/23/2016 11/14/16 11/21/16  Wynelle Cleveland, MD    Family History No family history on file.  Social History Social History  Substance Use Topics   Smoking status: Current Every Day Smoker    Types: Cigarettes   Smokeless tobacco: Never Used   Alcohol use No     Allergies   Hydrocodone; Other; and Tyloxapol   Review of Systems Review of Systems  All other systems reviewed and are  negative.    Physical Exam Updated Vital Signs BP (!) 151/68    Pulse 79    Temp 98.5 F (36.9 C) (Oral)    Resp 18    Ht 5\' 4"  (1.626 m)    Wt 86.2 kg (190 lb 0.6 oz)    SpO2 95%    BMI 32.62 kg/m   Physical Exam  Constitutional: She is oriented to person, place, and time. She appears well-developed and well-nourished. No distress.  HENT:  Head: Normocephalic and atraumatic.  Eyes: Pupils are equal, round, and reactive to light. EOM are normal.  Neck: Normal range of motion. Neck supple.  Cardiovascular: Normal rate and regular rhythm.  Exam reveals no gallop and no friction rub.   No murmur heard. Pulmonary/Chest: Effort normal and breath sounds normal. No respiratory distress. She has no wheezes.  Abdominal: Soft. Bowel sounds are normal. She exhibits no distension. There is no tenderness.  Musculoskeletal: Normal range of motion.  Neurological: She is alert and oriented to person, place, and time. No cranial nerve deficit. She exhibits normal muscle tone. Coordination normal.  Skin: Skin is warm and dry. She is not diaphoretic.  Nursing note and vitals reviewed.    ED Treatments / Results  Labs (all labs ordered are listed, but only abnormal results are displayed) Labs Reviewed  BASIC METABOLIC PANEL - Abnormal; Notable for the following:  Result Value   Glucose, Bld 164 (*)    All other components within normal limits  CBC WITH DIFFERENTIAL/PLATELET - Abnormal; Notable for the following:    WBC 14.7 (*)    Neutro Abs 9.3 (*)    Lymphs Abs 4.1 (*)    Monocytes Absolute 1.1 (*)    All other components within normal limits  PROTIME-INR  HEPARIN LEVEL (UNFRACTIONATED)  HEPARIN LEVEL (UNFRACTIONATED)  CBC    EKG  EKG Interpretation None       Radiology No results found.  Procedures Procedures (including critical care time)  Medications Ordered in ED Medications  heparin ADULT infusion 100 units/mL (25000 units/280mL sodium chloride 0.45%) (900  Units/hr Intravenous New Bag/Given 11/23/16 1411)  gadobenate dimeglumine (MULTIHANCE) injection 18 mL (18 mLs Intravenous Contrast Given 11/23/16 1627)     Initial Impression / Assessment and Plan / ED Course  I have reviewed the triage vital signs and the nursing notes.  Pertinent labs & imaging results that were available during my care of the patient were reviewed by me and considered in my medical decision making (see chart for details).  Patient's care was initially discussed with Dr. Laurence Slate who is recommending an MRI of the brain and MRA MRI of the neck. This will be obtained and the patient will be admitted to the hospitalist service.  Final Clinical Impressions(s) / ED Diagnoses   Final diagnoses:  None    New Prescriptions New Prescriptions   No medications on file     Geoffery Lyons, MD 11/23/16 [phone]

## 2016-11-23 NOTE — Progress Notes (Signed)
Patient refused 2200 vital signs and assessment. She said she would not allow IV team to start IV I informed that she has heparin gtt and that needs to be restarted she said that she is not allowing nothing else to be done to her and that she should have not come and she may go home. I apologized that the needle hurt and that I will can call the IV team to start IV  She said No. Ilean Skill LPN

## 2016-11-23 NOTE — ED Notes (Signed)
Pt refusing to keep BP cuff on at this time. Pt being verbally aggressive towards RN stating " that BP cuff is just squeezing too tight and why can't you just come in here and take my BP. You haven't done anything for [place] and you can just unplug this and we can get on out of here; don't you say another word." Pt requesting another nurse. Pt prompted that we haven't gotten any BP's on her since MRI and that they will be monitoring her vitals upstairs. Pt aware that she will receive another RN once upstairs.

## 2016-11-23 NOTE — ED Notes (Signed)
Admitting provider let this RN know that pt will be signing out AMA. Pt now requesting to stay, admitting provider to be notified.

## 2016-11-23 NOTE — Progress Notes (Signed)
Pt expressed frustration about not being able to smoke a cigarette. Pt stated that she wants to go home tomorrow even if they will not allow her to go. Pt educated.

## 2016-11-23 NOTE — ED Notes (Addendum)
Jessica(RN) instructed this NT to place pt on heart monitor, pt placed on heart monitor. This NT tried putting a new pulse ox and b/p cuff on pt and pt's visitor began to get verbally aggressive, started calling Jessica(RN) foul names pt visitor stated "only reason for the b/p cuff being automated is because Jessica(RN) doesn't want to come into the pt's room." Jessica(RN) notified.

## 2016-11-23 NOTE — ED Notes (Signed)
Neurology at bedside for admission consult.

## 2016-11-23 NOTE — Progress Notes (Signed)
Pt and husband arrived to unit very upset with the care they said pt received prior to arriving to unit. Pt husband making statements about the staff in ED. Pt stated immediately that she was hungry and was told that unit had her meal tray waiting for her. She was informed that this nurse would find her something to eat. Pt oriented to room. Safety measures in place. Call bell within reach. Neuro intact. Pt denies pain or discomfort. Will continue to monitor.

## 2016-11-23 NOTE — Progress Notes (Signed)
Patient refused to have chest xray. Ilean Skill LPN

## 2016-11-23 NOTE — Progress Notes (Addendum)
Patient refused labs and cardiac monitoring. [facility] notified that patient is refusing labs, IV site Heparin gtt and chest xray.Patient was educated about the labs are needed for blood work and to check if heparin is at the level it is needed to be, and that the chest xray is to look at the lungs and heart she stated that she did not know the transporter and because he did not know reason for the xray. I explained that he is a transporter to get her to x-ray. She stated she didn't need that test and wanted to be left alone Pharmacy notified that Heparin is on hold. Ilean Skill LPN

## 2016-11-24 LAB — HIV ANTIBODY (ROUTINE TESTING W REFLEX): HIV SCREEN 4TH GENERATION: NONREACTIVE

## 2016-11-24 NOTE — Discharge Summary (Addendum)
Physician Discharge Summary  Kista Robb JWJ:[id number] DOB: 10-Jan-1961 DOA: 11/23/2016  PCP: Patient, No Pcp Per  Admit date: 11/23/2016 Discharge date: 11/24/2016  Admitted From: Home Disposition:  Home  Patient signed out AGAINST MEDICAL ADVICE few hours after being admitted to the hospital.  Discharge Condition: Guarded CODE STATUS: Full code Diet recommendation: Regular  History of present illness Please refer to admission H&P for details, in brief, 56 year old female with recently diagnosed stroke with left vertebral artery dissection who signed out AMA (first from [facility] hospital and then from [facility] ED in the past 2 weeks), active smoker who returned back to the ED with persistent slurred speech and some confusion. During her last ED visit she signed out AMA and was given prescription for Lovenox and Coumadin and instructed to return to the ED in 1 week for INR check which she didn't. Neuro hospitalist consulted and recommended admission for stroke workup. Patient reluctantly agreed to get admitted (threatened to leave AMA twice while in the ED this time again).   Discharge Diagnoses:  Principal Problem:   Acute ischemic stroke [facility]) MRI brain/MRA head showed 5 mm acute left parietal lobe infarct superimposed on subacute to chronic left frontoparietal/MCA territory infarct with petechial hemorrhage. MRA head shows severe stenosis of left M2 segment and left A2 segment with moderate stenosis of the right internal carotid artery. 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 Patient was started on IV heparin and stroke workup initiated. However in the floor patient refused labs, cardiac monitoring and get her vitals checked. Patient wanted to go out to smoke as well.  Around midnight patient notified the nursing staff that she wanted to go home. hospitalist APP was notified and before they could discuss with the patient and  explain her medical condition and the risk to her health of signing out AMA s. Patient left the hospital without signing AMA paperwork and without any prescriptions.  Patient had threatened to me to leave AMA when I examined her in the ED. I discussed in great length about her current symptoms and why she needed to stay in the hospital for her stroke workup and get the right treatment. Had also discussed the risk she had a health on signing out AMA.     Active Problems:   Tobacco abuse  Family communication: Significant other at bedside  Procedures: MRI brain  Consults: Neuro hospitalist    Discharge Instructions   Allergies as of 11/24/2016      Reactions   Hydrocodone Nausea And Vomiting   Other Other (See Comments)   States all pain medications make her very ill   Tyloxapol Nausea And Vomiting      Medication List    ASK your doctor about these medications   enoxaparin 150 MG/ML injection Commonly known as:  LOVENOX Inject 0.57 mLs (85 mg total) into the skin every 12 (twelve) hours.   warfarin 5 MG tablet Commonly known as:  COUMADIN Take 1 tablet (5 mg total) by mouth one time only at 6 PM.       Allergies  Allergen Reactions   Hydrocodone Nausea And Vomiting   Other Other (See Comments)    States all pain medications make her very ill   Tyloxapol Nausea And Vomiting      Procedures/Studies: Ct Angio Head W Or Wo Contrast  Result Date: 11/14/2016 CLINICAL DATA:  Recent stroke.  Right facial droop and lip numbness. EXAM: CT ANGIOGRAPHY HEAD AND NECK  TECHNIQUE: Multidetector CT imaging of the head and neck was performed using the standard protocol during bolus administration of intravenous contrast. Multiplanar CT image reconstructions and MIPs were obtained to evaluate the vascular anatomy. Carotid stenosis measurements (when applicable) are obtained utilizing NASCET criteria, using the distal internal carotid diameter as the denominator. CONTRAST:  50  mL Isovue 370 COMPARISON:  None. FINDINGS: CT HEAD FINDINGS Brain: There is focal hypoattenuation within the posterior left middle cerebral artery territory extending to the parietal cortex. No hemorrhage. No midline shift or other mass effect. There is nearby sulcal effacement. No hydrocephalus. No age advanced atrophy. Vascular: No hyperdense vessel or unexpected calcification. Skull: Normal visualized skull base, calvarium and extracranial soft tissues. Sinuses/Orbits: No sinus fluid levels or advanced mucosal thickening. No mastoid effusion. Normal orbits. CTA NECK FINDINGS Aortic arch: There is no aneurysm or dissection of the visualized ascending aorta or aortic arch. There is a normal variant aortic arch branching pattern with the brachiocephalic and left common carotid arteries sharing a common origin. The visualized proximal subclavian arteries are normal. Mild atherosclerotic calcification. Right carotid system: The right common carotid origin is widely patent. There is no common carotid or internal carotid artery dissection or aneurysm. Mild atherosclerotic calcification at the carotid bifurcation without hemodynamically significant stenosis. Left carotid system: The left common carotid origin is widely patent. There is no common carotid or internal carotid artery dissection or aneurysm. Mild atherosclerotic calcification at the carotid bifurcation without hemodynamically significant stenosis. Vertebral arteries: The vertebral system is right dominant. The left vertebral artery is diminutive along its entire course and is non-opacified in its V4 segment proximal to the basilar confluence. The left transverse foramina are of normal caliber (arguing against congenital variation). The right vertebral artery is normal along its entire course. Skeleton: There is no bony spinal canal stenosis. No lytic or blastic lesions. Other neck: The nasopharynx is clear. The oropharynx and hypopharynx are normal. The  epiglottis is normal. The supraglottic larynx, glottis and subglottic larynx are normal. No retropharyngeal collection. The parapharyngeal spaces are preserved. The parotid and submandibular glands are normal. No sialolithiasis or salivary ductal dilatation. The thyroid gland is normal. There is no cervical lymphadenopathy. Upper chest: No pneumothorax or pleural effusion. No nodules or masses. Review of the MIP images confirms the above findings CTA HEAD FINDINGS Anterior circulation: --Intracranial internal carotid arteries: Approximately 50% narrowing of the distal cavernous segment of the right internal carotid artery. Minimal calcification the proximal left clinoid segment. Otherwise normal. --Anterior cerebral arteries: Normal. --Middle cerebral arteries: Moderate stenosis of the proximal left M2 segment superior division. Normal right MCA. --Posterior communicating arteries: Present bilaterally. Posterior circulation: --Posterior cerebral arteries: Bilateral fetal origins. Diminutive bilateral P1 segments. --Superior cerebellar arteries: Normal. --Basilar artery: Normal. --Anterior inferior cerebellar arteries: Normal. --Posterior inferior cerebellar arteries: Both posterior inferior cerebellar arteries arise from the perspective anterior inferior cerebellar arteries. Venous sinuses: As permitted by contrast timing, patent. Anatomic variants: Bilateral fetal origin posterior cerebral arteries. Bilateral shared AICA-PICA origins. Delayed phase: No parenchymal contrast enhancement. Review of the MIP images confirms the above findings IMPRESSION: 1. Focal hypoattenuation within the left parietal lobe, in the posterior left middle cerebral artery territory vs left MCA/PCA watershed, consistent with subacute infarct (5-10 days). No hemorrhage or mass effect. 2. Diffuse narrowing and attenuated enhancement of the left vertebral artery, suggesting acute vertebral artery dissection. The distal V4 segment is occluded.  Of note, the the left PICA arises from a common origin with the left AICA from  the basilar artery and remains patent. 3. 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 Approximately 50% stenosis of the distal cavernous segment of the right internal carotid artery. Critical Value/emergent results were called by telephone at the time of interpretation on 11/14/2016 at 8:32 pm to Dr. Wynelle Cleveland , who verbally acknowledged these results. Electronically Signed   By: Deatra Robinson M.D.   On: 11/14/2016 20:37   Ct Angio Neck W And/or Wo Contrast  Result Date: 11/14/2016 CLINICAL DATA:  Recent stroke.  Right facial droop and lip numbness. EXAM: CT ANGIOGRAPHY HEAD AND NECK TECHNIQUE: Multidetector CT imaging of the head and neck was performed using the standard protocol during bolus administration of intravenous contrast. Multiplanar CT image reconstructions and MIPs were obtained to evaluate the vascular anatomy. Carotid stenosis measurements (when applicable) are obtained utilizing NASCET criteria, using the distal internal carotid diameter as the denominator. CONTRAST:  50 mL Isovue 370 COMPARISON:  None. FINDINGS: CT HEAD FINDINGS Brain: There is focal hypoattenuation within the posterior left middle cerebral artery territory extending to the parietal cortex. No hemorrhage. No midline shift or other mass effect. There is nearby sulcal effacement. No hydrocephalus. No age advanced atrophy. Vascular: No hyperdense vessel or unexpected calcification. Skull: Normal visualized skull base, calvarium and extracranial soft tissues. Sinuses/Orbits: No sinus fluid levels or advanced mucosal thickening. No mastoid effusion. Normal orbits. CTA NECK FINDINGS Aortic arch: There is no aneurysm or dissection of the visualized ascending aorta or aortic arch. There is a normal variant aortic arch branching pattern with the brachiocephalic and left common carotid arteries sharing a common origin. The  visualized proximal subclavian arteries are normal. Mild atherosclerotic calcification. Right carotid system: The right common carotid origin is widely patent. There is no common carotid or internal carotid artery dissection or aneurysm. Mild atherosclerotic calcification at the carotid bifurcation without hemodynamically significant stenosis. Left carotid system: The left common carotid origin is widely patent. There is no common carotid or internal carotid artery dissection or aneurysm. Mild atherosclerotic calcification at the carotid bifurcation without hemodynamically significant stenosis. Vertebral arteries: The vertebral system is right dominant. The left vertebral artery is diminutive along its entire course and is non-opacified in its V4 segment proximal to the basilar confluence. The left transverse foramina are of normal caliber (arguing against congenital variation). The right vertebral artery is normal along its entire course. Skeleton: There is no bony spinal canal stenosis. No lytic or blastic lesions. Other neck: The nasopharynx is clear. The oropharynx and hypopharynx are normal. The epiglottis is normal. The supraglottic larynx, glottis and subglottic larynx are normal. No retropharyngeal collection. The parapharyngeal spaces are preserved. The parotid and submandibular glands are normal. No sialolithiasis or salivary ductal dilatation. The thyroid gland is normal. There is no cervical lymphadenopathy. Upper chest: No pneumothorax or pleural effusion. No nodules or masses. Review of the MIP images confirms the above findings CTA HEAD FINDINGS Anterior circulation: --Intracranial internal carotid arteries: Approximately 50% narrowing of the distal cavernous segment of the right internal carotid artery. Minimal calcification the proximal left clinoid segment. Otherwise normal. --Anterior cerebral arteries: Normal. --Middle cerebral arteries: Moderate stenosis of the proximal left M2 segment superior  division. Normal right MCA. --Posterior communicating arteries: Present bilaterally. Posterior circulation: --Posterior cerebral arteries: Bilateral fetal origins. Diminutive bilateral P1 segments. --Superior cerebellar arteries: Normal. --Basilar artery: Normal. --Anterior inferior cerebellar arteries: Normal. --Posterior inferior cerebellar arteries: Both posterior inferior cerebellar arteries arise from the perspective anterior inferior cerebellar arteries. Venous sinuses:  As permitted by contrast timing, patent. Anatomic variants: Bilateral fetal origin posterior cerebral arteries. Bilateral shared AICA-PICA origins. Delayed phase: No parenchymal contrast enhancement. Review of the MIP images confirms the above findings IMPRESSION: 1. Focal hypoattenuation within the left parietal lobe, in the posterior left middle cerebral artery territory vs left MCA/PCA watershed, consistent with subacute infarct (5-10 days). No hemorrhage or mass effect. 2. Diffuse narrowing and attenuated enhancement of the left vertebral artery, suggesting acute vertebral artery dissection. The distal V4 segment is occluded. Of note, the the left PICA arises from a common origin with the left AICA from the basilar artery and remains patent. 3. 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 Approximately 50% stenosis of the distal cavernous segment of the right internal carotid artery. Critical Value/emergent results were called by telephone at the time of interpretation on 11/14/2016 at 8:32 pm to Dr. Wynelle Cleveland , who verbally acknowledged these results. Electronically Signed   By: Deatra Robinson M.D.   On: 11/14/2016 20:37   Mr Angiogram Head Wo Contrast  Result Date: 11/23/2016 CLINICAL DATA:  Acute onset headache. Recent diagnosis of subacute infarct and LEFT vertebral artery dissection. EXAM: MRI HEAD WITHOUT CONTRAST MRA HEAD WITHOUT CONTRAST MRA NECK WITHOUT AND WITH CONTRAST TECHNIQUE: Multiplanar,  multiecho pulse sequences of the brain and surrounding structures were obtained without intravenous contrast. Angiographic images of the Circle of Willis were obtained using MRA technique without intravenous contrast. Angiographic images of the neck were obtained using MRA technique without and with intravenous contrast. Carotid stenosis measurements (when applicable) are obtained utilizing NASCET criteria, using the distal internal carotid diameter as the denominator. CONTRAST:  18mL MULTIHANCE GADOBENATE DIMEGLUMINE 529 MG/ML IV SOLN COMPARISON:  CT HEAD and CT angiogram of the head and neck November 14, 2016 FINDINGS: MRI HEAD FINDINGS BRAIN: 5 mm focus reduced diffusion LEFT parietal cortex in areas of faint reduced diffusion and T2 shine through. Patchy LEFT frontal parietal susceptibility artifact associated expansile FLAIR T2 hyperintense signal. Regional mass effect and sulcal effacement without midline shift. Ventricles and sulci are overall normal for patient's age. No mass. No abnormal extra-axial fluid collections. VASCULAR: Normal major intracranial vascular flow voids present at skull base. SKULL AND UPPER CERVICAL SPINE: No abnormal sellar expansion. No suspicious calvarial bone marrow signal. Craniocervical junction maintained. SINUSES/ORBITS: The mastoid air-cells and included paranasal sinuses are well-aerated. The included ocular globes and orbital contents are non-suspicious. Susceptibility artifact about the anterior orbits most compatible with makeup artifact. OTHER: Patient is edentulous. MRA HEAD FINDINGS- moderately motion degraded examination. ANTERIOR CIRCULATION: Flow related enhancement of the included cervical, petrous, cavernous and supraclinoid internal carotid arteries. Mildly stenotic RIGHT cavernous internal carotid artery. 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 Patent anterior and middle cerebral arteries, including distal segments. Severe  stenosis distal LEFT A2 segment. Severe stenosis LEFT M2 segment, superior division. No large vessel occlusion. POSTERIOR CIRCULATION: No flow related enhancement LEFT vertebral artery. LEFT posterior-inferior cerebellar artery not well seen. Basilar artery is patent, with normal flow related enhancement of the main branch vessels. Patent posterior cerebral arteries. Robust bilateral posterior communicating artery is present. Robust bilateral posterior communicating artery's. Mild luminal irregularity bilateral posterior cerebral arteries. No large vessel occlusion, aneurysm. ANATOMIC VARIANTS: None. Source images and MIP images were reviewed. MRA NECK FINDINGS- mildly motion degraded examination ANTERIOR CIRCULATION: The common carotid arteries are widely patent bilaterally. The carotid bifurcations are patent bilaterally without hemodynamically significant stenosis by NASCET criteria. No flow limiting stenosis  or luminal irregularity. POSTERIOR CIRCULATION: Origin of vertebral artery's not well characterized due to motion. Bilateral thready LEFT vertebral artery contrast opacification throughout the neck with intermittent apparent occlusion and reconstitution. Occluded distal LEFT V4 segment. Source images and MIP images were reviewed. IMPRESSION: MRI HEAD: 1. 5 mm acute LEFT parietal lobe infarct superimposed on subacute to chronic LEFT frontoparietal/MCA territory infarct with petechial hemorrhage. MRA HEAD: 1. No emergent large vessel occlusion on this moderately motion degraded examination. 2. Severe stenosis LEFT M2 segment, LEFT A2 segments. Moderate stenosis RIGHT internal carotid artery. 3. Mild luminal irregularity compatible with atherosclerosis, possible contributory motion artifact. 4. 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 MRA NECK: 1. 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 Included intracranial segment. 2. No hemodynamically significant  stenosis of the carotid arteries. Electronically Signed   By: Awilda Metro M.D.   On: 11/23/2016 18:25   Mr Angiogram Neck W Or Wo Contrast  Result Date: 11/23/2016 CLINICAL DATA:  Acute onset headache. Recent diagnosis of subacute infarct and LEFT vertebral artery dissection. EXAM: MRI HEAD WITHOUT CONTRAST MRA HEAD WITHOUT CONTRAST MRA NECK WITHOUT AND WITH CONTRAST TECHNIQUE: Multiplanar, multiecho pulse sequences of the brain and surrounding structures were obtained without intravenous contrast. Angiographic images of the Circle of Willis were obtained using MRA technique without intravenous contrast. Angiographic images of the neck were obtained using MRA technique without and with intravenous contrast. Carotid stenosis measurements (when applicable) are obtained utilizing NASCET criteria, using the distal internal carotid diameter as the denominator. CONTRAST:  18mL MULTIHANCE GADOBENATE DIMEGLUMINE 529 MG/ML IV SOLN COMPARISON:  CT HEAD and CT angiogram of the head and neck November 14, 2016 FINDINGS: MRI HEAD FINDINGS BRAIN: 5 mm focus reduced diffusion LEFT parietal cortex in areas of faint reduced diffusion and T2 shine through. Patchy LEFT frontal parietal susceptibility artifact associated expansile FLAIR T2 hyperintense signal. Regional mass effect and sulcal effacement without midline shift. Ventricles and sulci are overall normal for patient's age. No mass. No abnormal extra-axial fluid collections. VASCULAR: Normal major intracranial vascular flow voids present at skull base. SKULL AND UPPER CERVICAL SPINE: No abnormal sellar expansion. No suspicious calvarial bone marrow signal. Craniocervical junction maintained. SINUSES/ORBITS: The mastoid air-cells and included paranasal sinuses are well-aerated. The included ocular globes and orbital contents are non-suspicious. Susceptibility artifact about the anterior orbits most compatible with makeup artifact. OTHER: Patient is edentulous. MRA HEAD  FINDINGS- moderately motion degraded examination. ANTERIOR CIRCULATION: Flow related enhancement of the included cervical, petrous, cavernous and supraclinoid internal carotid arteries. Mildly stenotic RIGHT cavernous internal carotid artery. 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 Patent anterior and middle cerebral arteries, including distal segments. Severe stenosis distal LEFT A2 segment. Severe stenosis LEFT M2 segment, superior division. No large vessel occlusion. POSTERIOR CIRCULATION: No flow related enhancement LEFT vertebral artery. LEFT posterior-inferior cerebellar artery not well seen. Basilar artery is patent, with normal flow related enhancement of the main branch vessels. Patent posterior cerebral arteries. Robust bilateral posterior communicating artery is present. Robust bilateral posterior communicating artery's. Mild luminal irregularity bilateral posterior cerebral arteries. No large vessel occlusion, aneurysm. ANATOMIC VARIANTS: None. Source images and MIP images were reviewed. MRA NECK FINDINGS- mildly motion degraded examination ANTERIOR CIRCULATION: The common carotid arteries are widely patent bilaterally. The carotid bifurcations are patent bilaterally without hemodynamically significant stenosis by NASCET criteria. No flow limiting stenosis or luminal irregularity. POSTERIOR CIRCULATION: Origin of vertebral artery's not well characterized due to motion. Bilateral thready LEFT vertebral artery contrast  opacification throughout the neck with intermittent apparent occlusion and reconstitution. Occluded distal LEFT V4 segment. Source images and MIP images were reviewed. IMPRESSION: MRI HEAD: 1. 5 mm acute LEFT parietal lobe infarct superimposed on subacute to chronic LEFT frontoparietal/MCA territory infarct with petechial hemorrhage. MRA HEAD: 1. No emergent large vessel occlusion on this moderately motion degraded examination. 2. Severe stenosis LEFT M2  segment, LEFT A2 segments. Moderate stenosis RIGHT internal carotid artery. 3. Mild luminal irregularity compatible with atherosclerosis, possible contributory motion artifact. 4. 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 MRA NECK: 1. 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 Included intracranial segment. 2. No hemodynamically significant stenosis of the carotid arteries. Electronically Signed   By: Awilda Metro M.D.   On: 11/23/2016 18:25   Mr Brain Wo Contrast  Result Date: 11/23/2016 CLINICAL DATA:  Acute onset headache. Recent diagnosis of subacute infarct and LEFT vertebral artery dissection. EXAM: MRI HEAD WITHOUT CONTRAST MRA HEAD WITHOUT CONTRAST MRA NECK WITHOUT AND WITH CONTRAST TECHNIQUE: Multiplanar, multiecho pulse sequences of the brain and surrounding structures were obtained without intravenous contrast. Angiographic images of the Circle of Willis were obtained using MRA technique without intravenous contrast. Angiographic images of the neck were obtained using MRA technique without and with intravenous contrast. Carotid stenosis measurements (when applicable) are obtained utilizing NASCET criteria, using the distal internal carotid diameter as the denominator. CONTRAST:  18mL MULTIHANCE GADOBENATE DIMEGLUMINE 529 MG/ML IV SOLN COMPARISON:  CT HEAD and CT angiogram of the head and neck November 14, 2016 FINDINGS: MRI HEAD FINDINGS BRAIN: 5 mm focus reduced diffusion LEFT parietal cortex in areas of faint reduced diffusion and T2 shine through. Patchy LEFT frontal parietal susceptibility artifact associated expansile FLAIR T2 hyperintense signal. Regional mass effect and sulcal effacement without midline shift. Ventricles and sulci are overall normal for patient's age. No mass. No abnormal extra-axial fluid collections. VASCULAR: Normal major intracranial vascular flow voids present at skull base. SKULL AND UPPER CERVICAL SPINE: No  abnormal sellar expansion. No suspicious calvarial bone marrow signal. Craniocervical junction maintained. SINUSES/ORBITS: The mastoid air-cells and included paranasal sinuses are well-aerated. The included ocular globes and orbital contents are non-suspicious. Susceptibility artifact about the anterior orbits most compatible with makeup artifact. OTHER: Patient is edentulous. MRA HEAD FINDINGS- moderately motion degraded examination. ANTERIOR CIRCULATION: Flow related enhancement of the included cervical, petrous, cavernous and supraclinoid internal carotid arteries. Mildly stenotic RIGHT cavernous internal carotid artery. 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 Patent anterior and middle cerebral arteries, including distal segments. Severe stenosis distal LEFT A2 segment. Severe stenosis LEFT M2 segment, superior division. No large vessel occlusion. POSTERIOR CIRCULATION: No flow related enhancement LEFT vertebral artery. LEFT posterior-inferior cerebellar artery not well seen. Basilar artery is patent, with normal flow related enhancement of the main branch vessels. Patent posterior cerebral arteries. Robust bilateral posterior communicating artery is present. Robust bilateral posterior communicating artery's. Mild luminal irregularity bilateral posterior cerebral arteries. No large vessel occlusion, aneurysm. ANATOMIC VARIANTS: None. Source images and MIP images were reviewed. MRA NECK FINDINGS- mildly motion degraded examination ANTERIOR CIRCULATION: The common carotid arteries are widely patent bilaterally. The carotid bifurcations are patent bilaterally without hemodynamically significant stenosis by NASCET criteria. No flow limiting stenosis or luminal irregularity. POSTERIOR CIRCULATION: Origin of vertebral artery's not well characterized due to motion. Bilateral thready LEFT vertebral artery contrast opacification throughout the neck with intermittent apparent occlusion  and reconstitution. Occluded distal LEFT V4 segment. Source images and MIP images were reviewed. IMPRESSION:  MRI HEAD: 1. 5 mm acute LEFT parietal lobe infarct superimposed on subacute to chronic LEFT frontoparietal/MCA territory infarct with petechial hemorrhage. MRA HEAD: 1. No emergent large vessel occlusion on this moderately motion degraded examination. 2. Severe stenosis LEFT M2 segment, LEFT A2 segments. Moderate stenosis RIGHT internal carotid artery. 3. Mild luminal irregularity compatible with atherosclerosis, possible contributory motion artifact. 4. 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 MRA NECK: 1. 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 Included intracranial segment. 2. No hemodynamically significant stenosis of the carotid arteries. Electronically Signed   By: Awilda Metro M.D.   On: 11/23/2016 18:25        Discharge Exam: Vitals:   11/23/16 1844 11/23/16 2009  BP: (!) 142/78 131/68  Pulse: 72 74  Resp: 18   Temp: 98.7 F (37.1 C) 98.2 F (36.8 C)  SpO2: 94% 94%   Vitals:   11/23/16 1405 11/23/16 1651 11/23/16 1844 11/23/16 2009  BP: (!) 149/89 (!) 151/68 (!) 142/78 131/68  Pulse: 75 79 72 74  Resp: 18 16 18    Temp:   98.7 F (37.1 C) 98.2 F (36.8 C)  TempSrc:   Oral Oral  SpO2:  95% 94% 94%  Weight:   94.7 kg (208 lb 12.8 oz)   Height:   5\' 4"  (1.626 m)      General: Middle aged female lying in bed, appears irritable HEENT: No pallor, moist mucosa, supple neck,  Chest: Clear to auscultation bilaterally   CVS: Normal S1 and S2, no murmurs GI: Soft, nondistended, nontender Musculoskeletal :Warm, no edema CNS: Alert and oriented, normal strength and tone in all extremities, cranial nerves 2-12 intact, no facial droop or slurred speech  The results of significant diagnostics from this hospitalization (including imaging, microbiology, ancillary and laboratory) are listed below for reference.      Microbiology: No results found for this or any previous visit (from the past 240 hour(s)).   Labs: BNP (last 3 results) No results for input(s): BNP in the last 8760 hours. Basic Metabolic Panel:  Recent Labs Lab 11/23/16 1148  NA 139  K 3.5  CL 107  CO2 26  GLUCOSE 164*  BUN 11  CREATININE 0.56  CALCIUM 9.0   Liver Function Tests: No results for input(s): AST, ALT, ALKPHOS, BILITOT, PROT, ALBUMIN in the last 168 hours. No results for input(s): LIPASE, AMYLASE in the last 168 hours. No results for input(s): AMMONIA in the last 168 hours. CBC:  Recent Labs Lab 11/23/16 1148  WBC 14.7*  NEUTROABS 9.3*  HGB 13.6  HCT 40.0  MCV 88.3  PLT 252   Cardiac Enzymes: No results for input(s): CKTOTAL, CKMB, CKMBINDEX, TROPONINI in the last 168 hours. BNP: Invalid input(s): POCBNP CBG: No results for input(s): GLUCAP in the last 168 hours. D-Dimer No results for input(s): DDIMER in the last 72 hours. Hgb A1c No results for input(s): HGBA1C in the last 72 hours. Lipid Profile No results for input(s): CHOL, HDL, LDLCALC, TRIG, CHOLHDL, LDLDIRECT in the last 72 hours. Thyroid function studies  Recent Labs  11/23/16 1839  TSH 3.437   Anemia work up No results for input(s): VITAMINB12, FOLATE, FERRITIN, TIBC, IRON, RETICCTPCT in the last 72 hours. Urinalysis No results found for: COLORURINE, APPEARANCEUR, LABSPEC, PHURINE, GLUCOSEU, HGBUR, BILIRUBINUR, KETONESUR, PROTEINUR, UROBILINOGEN, NITRITE, LEUKOCYTESUR Sepsis Labs Invalid input(s): PROCALCITONIN,  WBC,  LACTICIDVEN Microbiology No results found for this or any previous visit (from the past 240 hour(s)).   Time coordinating discharge: <  30 minutes  SIGNED:   Eddie North, MD  [facility] 11/24/2016, 4:51 PM Pager   If 7PM-7AM, please contact night-coverage [email or web address] Password [email or web address]

## 2016-11-24 NOTE — Progress Notes (Signed)
[facility] notified that patient left before signing he AMA papers she was instructed to wait to sign but she left anyway. Charge RN was also informed that patient left before signing AMA.Ilean Skill LPN

## 2020-09-21 ENCOUNTER — Inpatient Hospital Stay (HOSPITAL_COMMUNITY): Payer: Self-pay

## 2020-09-21 ENCOUNTER — Emergency Department (HOSPITAL_COMMUNITY): Admit: 2020-09-21 | Payer: Self-pay | Admitting: Cardiology

## 2020-09-21 ENCOUNTER — Other Ambulatory Visit (HOSPITAL_COMMUNITY): Payer: Self-pay

## 2020-09-21 ENCOUNTER — Encounter (HOSPITAL_COMMUNITY)
Admission: EM | Discharge status: Against Medical Advice | Payer: Self-pay | Source: Other Acute Inpatient Hospital | Attending: Cardiology

## 2020-09-21 ENCOUNTER — Other Ambulatory Visit: Payer: Self-pay

## 2020-09-21 ENCOUNTER — Inpatient Hospital Stay (HOSPITAL_COMMUNITY)
Admission: EM | Admit: 2020-09-21 | Discharge: 2020-09-22 | DRG: 247 | Discharge status: Against Medical Advice | Payer: Self-pay | Source: Other Acute Inpatient Hospital | Attending: Cardiology | Admitting: Cardiology

## 2020-09-21 ENCOUNTER — Encounter (HOSPITAL_COMMUNITY): Payer: Self-pay | Admitting: Cardiology

## 2020-09-21 DIAGNOSIS — I2109 ST elevation (STEMI) myocardial infarction involving other coronary artery of anterior wall: Principal | ICD-10-CM | POA: Diagnosis present

## 2020-09-21 DIAGNOSIS — E119 Type 2 diabetes mellitus without complications: Secondary | ICD-10-CM

## 2020-09-21 DIAGNOSIS — E7800 Pure hypercholesterolemia, unspecified: Secondary | ICD-10-CM | POA: Diagnosis present

## 2020-09-21 DIAGNOSIS — I2510 Atherosclerotic heart disease of native coronary artery without angina pectoris: Secondary | ICD-10-CM | POA: Diagnosis present

## 2020-09-21 DIAGNOSIS — Z20822 Contact with and (suspected) exposure to covid-19: Secondary | ICD-10-CM | POA: Diagnosis present

## 2020-09-21 DIAGNOSIS — I252 Old myocardial infarction: Secondary | ICD-10-CM

## 2020-09-21 DIAGNOSIS — E785 Hyperlipidemia, unspecified: Secondary | ICD-10-CM | POA: Diagnosis present

## 2020-09-21 DIAGNOSIS — R062 Wheezing: Secondary | ICD-10-CM | POA: Diagnosis present

## 2020-09-21 DIAGNOSIS — E118 Type 2 diabetes mellitus with unspecified complications: Secondary | ICD-10-CM

## 2020-09-21 DIAGNOSIS — I2119 ST elevation (STEMI) myocardial infarction involving other coronary artery of inferior wall: Secondary | ICD-10-CM | POA: Diagnosis present

## 2020-09-21 DIAGNOSIS — I25118 Atherosclerotic heart disease of native coronary artery with other forms of angina pectoris: Secondary | ICD-10-CM | POA: Diagnosis present

## 2020-09-21 DIAGNOSIS — Z6832 Body mass index (BMI) 32.0-32.9, adult: Secondary | ICD-10-CM

## 2020-09-21 DIAGNOSIS — R042 Hemoptysis: Secondary | ICD-10-CM | POA: Diagnosis present

## 2020-09-21 DIAGNOSIS — Z5329 Procedure and treatment not carried out because of patient's decision for other reasons: Secondary | ICD-10-CM | POA: Diagnosis not present

## 2020-09-21 DIAGNOSIS — Z8673 Personal history of transient ischemic attack (TIA), and cerebral infarction without residual deficits: Secondary | ICD-10-CM

## 2020-09-21 DIAGNOSIS — I213 ST elevation (STEMI) myocardial infarction of unspecified site: Secondary | ICD-10-CM | POA: Diagnosis present

## 2020-09-21 DIAGNOSIS — E669 Obesity, unspecified: Secondary | ICD-10-CM | POA: Diagnosis present

## 2020-09-21 DIAGNOSIS — F17210 Nicotine dependence, cigarettes, uncomplicated: Secondary | ICD-10-CM | POA: Diagnosis present

## 2020-09-21 DIAGNOSIS — I2102 ST elevation (STEMI) myocardial infarction involving left anterior descending coronary artery: Principal | ICD-10-CM | POA: Diagnosis present

## 2020-09-21 HISTORY — PX: CORONARY/GRAFT ACUTE MI REVASCULARIZATION: CATH118305

## 2020-09-21 HISTORY — PX: CORONARY THROMBECTOMY: CATH118304

## 2020-09-21 HISTORY — DX: Type 2 diabetes mellitus without complications: E11.9

## 2020-09-21 HISTORY — PX: LEFT HEART CATH AND CORONARY ANGIOGRAPHY: CATH118249

## 2020-09-21 HISTORY — DX: Hyperlipidemia, unspecified: E78.5

## 2020-09-21 LAB — ECHOCARDIOGRAM COMPLETE
Area-P 1/2: 3.72 cm2
Calc EF: 21.9 %
Height: 64 in
S' Lateral: 4.4 cm
Single Plane A2C EF: 29 %
Single Plane A4C EF: 15.6 %
Weight: 2991.2 oz

## 2020-09-21 LAB — COMPREHENSIVE METABOLIC PANEL
ALT: 70 U/L — ABNORMAL HIGH (ref 0–44)
AST: 58 U/L — ABNORMAL HIGH (ref 15–41)
Albumin: 3 g/dL — ABNORMAL LOW (ref 3.5–5.0)
Alkaline Phosphatase: 70 U/L (ref 38–126)
Anion gap: 9 (ref 5–15)
BUN: 10 mg/dL (ref 6–20)
CO2: 26 mmol/L (ref 22–32)
Calcium: 8.3 mg/dL — ABNORMAL LOW (ref 8.9–10.3)
Chloride: 101 mmol/L (ref 98–111)
Creatinine, Ser: 0.63 mg/dL (ref 0.44–1.00)
GFR, Estimated: >60 mL/min (ref >60)
Glucose, Bld: 220 mg/dL — ABNORMAL HIGH (ref 70–99)
Potassium: 2.8 mmol/L — ABNORMAL LOW (ref 3.5–5.1)
Sodium: 136 mmol/L (ref 135–145)
Total Bilirubin: 0.8 mg/dL (ref 0.3–1.2)
Total Protein: 6.6 g/dL (ref 6.5–8.1)

## 2020-09-21 LAB — CBC
HCT: 33.2 % — ABNORMAL LOW (ref 36.0–46.0)
Hemoglobin: 11.2 g/dL — ABNORMAL LOW (ref 12.0–15.0)
MCH: 29.7 pg (ref 26.0–34.0)
MCHC: 33.7 g/dL (ref 30.0–36.0)
MCV: 88.1 fL (ref 80.0–100.0)
Platelets: 292 10*3/uL (ref 150–400)
RBC: 3.77 MIL/uL — ABNORMAL LOW (ref 3.87–5.11)
RDW: 12.9 % (ref 11.5–15.5)
WBC: 17.6 10*3/uL — ABNORMAL HIGH (ref 4.0–10.5)
nRBC: 0 % (ref 0.0–0.2)

## 2020-09-21 LAB — PROTIME-INR
INR: 1.1 (ref 0.8–1.2)
Prothrombin Time: 14.6 seconds (ref 11.4–15.2)

## 2020-09-21 LAB — GLUCOSE, CAPILLARY
Glucose-Capillary: 197 mg/dL — ABNORMAL HIGH (ref 70–99)
Glucose-Capillary: 141 mg/dL — ABNORMAL HIGH (ref 70–99)

## 2020-09-21 LAB — MRSA NEXT GEN BY PCR, NASAL: MRSA by PCR Next Gen: NOT DETECTED

## 2020-09-21 LAB — SARS CORONAVIRUS 2 BY RT PCR (HOSPITAL ORDER, PERFORMED IN ~~LOC~~ HOSPITAL LAB): SARS Coronavirus 2: POSITIVE — AB

## 2020-09-21 LAB — HEMOGLOBIN A1C
Hgb A1c MFr Bld: 7.4 % — ABNORMAL HIGH (ref 4.8–5.6)
Mean Plasma Glucose: 166 mg/dL

## 2020-09-21 LAB — LIPID PANEL
Cholesterol: 146 mg/dL (ref 0–200)
HDL: 24 mg/dL — ABNORMAL LOW (ref >40)
LDL Cholesterol: 101 mg/dL — ABNORMAL HIGH (ref 0–99)
Total CHOL/HDL Ratio: 6.1 RATIO
Triglycerides: 104 mg/dL (ref <150)
VLDL: 21 mg/dL (ref 0–40)

## 2020-09-21 LAB — POCT I-STAT, CHEM 8
BUN: 11 mg/dL (ref 6–20)
Calcium, Ion: 1.14 mmol/L — ABNORMAL LOW (ref 1.15–1.40)
Chloride: 100 mmol/L (ref 98–111)
Creatinine, Ser: 0.5 mg/dL (ref 0.44–1.00)
Glucose, Bld: 221 mg/dL — ABNORMAL HIGH (ref 70–99)
HCT: 32 % — ABNORMAL LOW (ref 36.0–46.0)
Hemoglobin: 10.9 g/dL — ABNORMAL LOW (ref 12.0–15.0)
Potassium: 2.9 mmol/L — ABNORMAL LOW (ref 3.5–5.1)
Sodium: 140 mmol/L (ref 135–145)
TCO2: 26 mmol/L (ref 22–32)

## 2020-09-21 LAB — TROPONIN I (HIGH SENSITIVITY): Troponin I (High Sensitivity): 1976 ng/L (ref <18)

## 2020-09-21 LAB — MAGNESIUM: Magnesium: 1.9 mg/dL (ref 1.7–2.4)

## 2020-09-21 LAB — APTT: aPTT: 28 seconds (ref 24–36)

## 2020-09-21 LAB — POCT ACTIVATED CLOTTING TIME
Activated Clotting Time: 265 seconds
Activated Clotting Time: 265 seconds

## 2020-09-21 SURGERY — CORONARY/GRAFT ACUTE MI REVASCULARIZATION
Anesthesia: LOCAL

## 2020-09-21 MED ORDER — INSULIN ASPART 100 UNIT/ML IJ SOLN: 0.0000 [IU] | Freq: Every day | INTRAMUSCULAR | Status: DC

## 2020-09-21 MED ORDER — SODIUM CHLORIDE 0.9 % IV SOLN: 250.0000 mL | INTRAVENOUS | Status: DC | PRN

## 2020-09-21 MED ORDER — FENTANYL CITRATE (PF) 100 MCG/2ML IJ SOLN
INTRAMUSCULAR | Status: AC
  Filled 2020-09-21: qty 2

## 2020-09-21 MED ORDER — ATORVASTATIN CALCIUM 80 MG PO TABS
80.0000 mg | ORAL_TABLET | Freq: Every day | ORAL | Status: DC
  Administered 2020-09-21: 80 mg via ORAL
  Filled 2020-09-21: qty 1

## 2020-09-21 MED ORDER — SODIUM CHLORIDE 0.9% FLUSH: 3.0000 mL | INTRAVENOUS | Status: DC | PRN

## 2020-09-21 MED ORDER — TICAGRELOR 90 MG PO TABS
90.0000 mg | ORAL_TABLET | Freq: Two times a day (BID) | ORAL | Status: DC
  Administered 2020-09-21 – 2020-09-22 (×2): 90 mg via ORAL
  Filled 2020-09-21 (×2): qty 1

## 2020-09-21 MED ORDER — MIDAZOLAM HCL 2 MG/2ML IJ SOLN: INTRAMUSCULAR | Status: DC | PRN

## 2020-09-21 MED ORDER — SODIUM CHLORIDE 0.9 % WEIGHT BASED INFUSION: 1.0000 mL/kg/h | INTRAVENOUS | Status: AC

## 2020-09-21 MED ORDER — ENSURE ENLIVE PO LIQD: 237.0000 mL | Freq: Two times a day (BID) | ORAL | Status: DC

## 2020-09-21 MED ORDER — TICAGRELOR 90 MG PO TABS
ORAL_TABLET | ORAL | Status: DC | PRN
  Administered 2020-09-21: 180 mg via ORAL

## 2020-09-21 MED ORDER — HEPARIN (PORCINE) IN NACL 1000-0.9 UT/500ML-% IV SOLN
INTRAVENOUS | Status: AC
  Filled 2020-09-21: qty 1000

## 2020-09-21 MED ORDER — INSULIN ASPART 100 UNIT/ML IJ SOLN: 0.0000 [IU] | Freq: Three times a day (TID) | INTRAMUSCULAR | Status: DC

## 2020-09-21 MED ORDER — LIDOCAINE HCL (PF) 1 % IJ SOLN
INTRAMUSCULAR | Status: DC | PRN
  Administered 2020-09-21: 2 mL via INTRADERMAL

## 2020-09-21 MED ORDER — FENTANYL CITRATE (PF) 100 MCG/2ML IJ SOLN: INTRAMUSCULAR | Status: DC | PRN

## 2020-09-21 MED ORDER — ACETAMINOPHEN 325 MG PO TABS
650.0000 mg | ORAL_TABLET | ORAL | Status: DC | PRN
  Administered 2020-09-21: 650 mg via ORAL
  Filled 2020-09-21: qty 2

## 2020-09-21 MED ORDER — HEPARIN (PORCINE) IN NACL 1000-0.9 UT/500ML-% IV SOLN
INTRAVENOUS | Status: DC | PRN
  Administered 2020-09-21 (×2): 500 mL

## 2020-09-21 MED ORDER — TICAGRELOR 90 MG PO TABS
ORAL_TABLET | ORAL | Status: AC
  Filled 2020-09-21: qty 1

## 2020-09-21 MED ORDER — PERFLUTREN LIPID MICROSPHERE
1.0000 mL | INTRAVENOUS | Status: AC | PRN
  Administered 2020-09-21: 2 mL via INTRAVENOUS
  Filled 2020-09-21: qty 10

## 2020-09-21 MED ORDER — LIDOCAINE HCL (PF) 1 % IJ SOLN
INTRAMUSCULAR | Status: AC
  Filled 2020-09-21: qty 30

## 2020-09-21 MED ORDER — VERAPAMIL HCL 2.5 MG/ML IV SOLN
INTRAVENOUS | Status: DC | PRN
  Administered 2020-09-21: 07:00:00 10 mL via INTRA_ARTERIAL

## 2020-09-21 MED ORDER — ONDANSETRON HCL 4 MG/2ML IJ SOLN: 4.0000 mg | Freq: Four times a day (QID) | INTRAMUSCULAR | Status: DC | PRN

## 2020-09-21 MED ORDER — NITROGLYCERIN 1 MG/10 ML FOR IR/CATH LAB
INTRA_ARTERIAL | Status: DC | PRN
  Administered 2020-09-21 (×3): 200 ug via INTRACORONARY

## 2020-09-21 MED ORDER — LOSARTAN POTASSIUM 25 MG PO TABS
25.0000 mg | ORAL_TABLET | Freq: Every day | ORAL | Status: DC
  Administered 2020-09-21 – 2020-09-22 (×2): 25 mg via ORAL
  Filled 2020-09-21 (×2): qty 1

## 2020-09-21 MED ORDER — CHLORHEXIDINE GLUCONATE CLOTH 2 % EX PADS
6.0000 | MEDICATED_PAD | Freq: Every day | CUTANEOUS | Status: DC
  Administered 2020-09-21: 6 via TOPICAL

## 2020-09-21 MED ORDER — METOPROLOL SUCCINATE ER 25 MG PO TB24
25.0000 mg | ORAL_TABLET | Freq: Every day | ORAL | Status: DC
  Administered 2020-09-21 – 2020-09-22 (×2): 25 mg via ORAL
  Filled 2020-09-21 (×2): qty 1

## 2020-09-21 MED ORDER — HEPARIN SODIUM (PORCINE) 1000 UNIT/ML IJ SOLN
INTRAMUSCULAR | Status: AC
  Filled 2020-09-21: qty 1

## 2020-09-21 MED ORDER — VERAPAMIL HCL 2.5 MG/ML IV SOLN
INTRAVENOUS | Status: DC | PRN
  Administered 2020-09-21: 2 mg via INTRA_ARTERIAL

## 2020-09-21 MED ORDER — INSULIN ASPART 100 UNIT/ML IJ SOLN
0.0000 [IU] | Freq: Three times a day (TID) | INTRAMUSCULAR | Status: DC
  Administered 2020-09-21: 2 [IU] via SUBCUTANEOUS
  Administered 2020-09-21 – 2020-09-22 (×2): 3 [IU] via SUBCUTANEOUS

## 2020-09-21 MED ORDER — HEPARIN SODIUM (PORCINE) 1000 UNIT/ML IJ SOLN
INTRAMUSCULAR | Status: DC | PRN
  Administered 2020-09-21: 8000 [IU] via INTRAVENOUS
  Administered 2020-09-21 (×3): 3000 [IU] via INTRAVENOUS

## 2020-09-21 MED ORDER — IOHEXOL 350 MG/ML SOLN
INTRAVENOUS | Status: DC | PRN
  Administered 2020-09-21: 135 mL via INTRA_ARTERIAL

## 2020-09-21 MED ORDER — ASPIRIN 81 MG PO CHEW
81.0000 mg | CHEWABLE_TABLET | Freq: Every day | ORAL | Status: DC
  Administered 2020-09-22: 81 mg via ORAL
  Filled 2020-09-21: qty 1

## 2020-09-21 MED ORDER — SODIUM CHLORIDE 0.9% FLUSH
3.0000 mL | Freq: Two times a day (BID) | INTRAVENOUS | Status: DC
  Administered 2020-09-21: 3 mL via INTRAVENOUS

## 2020-09-21 MED ORDER — EMPAGLIFLOZIN 10 MG PO TABS
10.0000 mg | ORAL_TABLET | Freq: Every day | ORAL | Status: DC
  Administered 2020-09-21: 10 mg via ORAL
  Filled 2020-09-21: qty 1

## 2020-09-21 MED ORDER — POTASSIUM CHLORIDE CRYS ER 20 MEQ PO TBCR
40.0000 meq | EXTENDED_RELEASE_TABLET | ORAL | Status: AC
  Administered 2020-09-21 (×3): 40 meq via ORAL
  Filled 2020-09-21 (×3): qty 2

## 2020-09-21 MED ORDER — HEPARIN SODIUM (PORCINE) 5000 UNIT/ML IJ SOLN
5000.0000 [IU] | Freq: Three times a day (TID) | INTRAMUSCULAR | Status: DC
  Administered 2020-09-21 – 2020-09-22 (×2): 5000 [IU] via SUBCUTANEOUS
  Filled 2020-09-21 (×2): qty 1

## 2020-09-21 MED ORDER — MIDAZOLAM HCL 2 MG/2ML IJ SOLN
INTRAMUSCULAR | Status: AC
  Filled 2020-09-21: qty 2

## 2020-09-21 SURGICAL SUPPLY — 21 items
BALLN SAPPHIRE 2.0X12 (BALLOONS) ×2
BALLN SAPPHIRE 3.0X12 (BALLOONS) ×2
BALLOON SAPPHIRE 2.0X12 (BALLOONS) IMPLANT
BALLOON SAPPHIRE 3.0X12 (BALLOONS) IMPLANT
CATH EXTRAC PRONTO 5.5F 138CM (CATHETERS) ×1 IMPLANT
CATH OPTITORQUE TIG 4.0 5F (CATHETERS) ×1 IMPLANT
CATH VISTA GUIDE 6FR AL1 (CATHETERS) ×1 IMPLANT
CATH VISTA GUIDE 6FR JR4 (CATHETERS) ×1 IMPLANT
DEVICE RAD COMP TR BAND LRG (VASCULAR PRODUCTS) ×2 IMPLANT
GLIDESHEATH SLEND A-KIT 6F 22G (SHEATH) ×1 IMPLANT
GUIDEWIRE INQWIRE 1.5J.035X260 (WIRE) IMPLANT
INQWIRE 1.5J .035X260CM (WIRE) ×2
KIT ENCORE 26 ADVANTAGE (KITS) ×1 IMPLANT
KIT HEART LEFT (KITS) ×2 IMPLANT
PACK CARDIAC CATHETERIZATION (CUSTOM PROCEDURE TRAY) ×2 IMPLANT
SHEATH PROBE COVER 6X72 (BAG) ×1 IMPLANT
STENT SYNERGY XD 3.0X24 (Permanent Stent) IMPLANT
SYNERGY XD 3.0X24 (Permanent Stent) ×2 IMPLANT
TRANSDUCER W/STOPCOCK (MISCELLANEOUS) ×2 IMPLANT
TUBING CIL FLEX 10 FLL-RA (TUBING) ×2 IMPLANT
WIRE COUGAR XT STRL 190CM (WIRE) ×3 IMPLANT

## 2020-09-21 NOTE — H&P (Addendum)
CARDIOLOGY ADMIT NOTE   Patient ID: Alexandra Ward MRN: [id number] DOB/AGE: October 13, 1960 60 y.o.  Admit date: 09/21/2020 Primary Physician:  Kirstie Peri, MD  Patient ID: Alexandra Ward, female    DOB: 09-19-1960, 60 y.o.   MRN: [id number]  No chief complaint on file.  HPI:    Alexandra Ward  is a 60 y.o. Caucasian female patient with hyperlipidemia, diabetes mellitus, history of left vertebral artery dissection and stroke in 2018, with no residual neurologic deficits, tobacco use disorder transferred from [facility] with activation of STEMI.  Patient is a poor historian.  States that she had spoken to her PCP on Wednesday about chest pain.  However she was prescribed antibiotics and recommended a chest x-ray and also steroids.  Patient states that with steroids her chest pain got worse and hence did not use it, however the chest pain got worse and eventually presented to the emergency room early this morning.  EKG revealed ST elevation in the anterolateral leads and also inferior leads, STEMI was activated and transferred to the hospital here at [facility].  On route to EMS, there was no report of any chest pain.  Patient arrived with chest pain-free.  Still had ST elevations globally.  Has occasional cough but no fever or chills.  Past Medical History:  Diagnosis Date   Diabetes mellitus without complication (HCC)    Hyperlipidemia    Stroke [facility])    Vertebral artery dissection 2018   No past surgical history on file. Social History   Socioeconomic History   Marital status: Divorced    Spouse name: Not on file   Number of children: Not on file   Years of education: Not on file   Highest education level: Not on file  Occupational History   Not on file  Tobacco Use   Smoking status: Every Day    Pack years: 0.00    Types: Cigarettes   Smokeless tobacco: Never  Substance and Sexual Activity   Alcohol use: No   Drug use: No   Sexual activity: Not on file  Other  Topics Concern   Not on file  Social History Narrative   Not on file   Social Determinants of Health   Financial Resource Strain: Not on file  Food Insecurity: Not on file  Transportation Needs: Not on file  Physical Activity: Not on file  Stress: Not on file  Social Connections: Not on file  Intimate Partner Violence: Not on file   No family history on file.  ROS  Review of Systems  Cardiovascular:  Positive for chest pain. Negative for dyspnea on exertion and leg swelling.  Respiratory:  Positive for cough.   Gastrointestinal:  Negative for melena.  All other systems reviewed and are negative. Objective   Vitals with BMI 09/21/2020 09/21/2020 09/21/2020  Height - - -  Weight - - -  BMI - - -  Systolic 134 126 299  Diastolic 89 84 82  Pulse 79 77 77      Physical Exam Constitutional:      General: She is not in acute distress.    Appearance: She is obese.  HENT:     Mouth/Throat:     Mouth: Mucous membranes are moist.  Eyes:     Extraocular Movements: Extraocular movements intact.  Neck:     Vascular: No carotid bruit or JVD.  Cardiovascular:     Rate and Rhythm: Normal rate and regular rhythm.     Pulses: Intact distal  pulses.          Radial pulses are 2+ on the right side and 2+ on the left side.       Femoral pulses are 2+ on the right side and 2+ on the left side.      Dorsalis pedis pulses are 0 on the right side and 0 on the left side.       Posterior tibial pulses are 0 on the right side and 0 on the left side.     Heart sounds: Normal heart sounds. No murmur heard.   No gallop.  Pulmonary:     Effort: Pulmonary effort is normal.     Breath sounds: Normal breath sounds.  Abdominal:     General: Bowel sounds are normal.     Palpations: Abdomen is soft.  Musculoskeletal:     Cervical back: Normal range of motion.  Skin:    General: Skin is warm.     Capillary Refill: Capillary refill takes less than 2 seconds.  Neurological:     General: No focal  deficit present.     Mental Status: She is oriented to person, place, and time.  Psychiatric:        Mood and Affect: Mood normal.   Laboratory examination:   Recent Labs    09/21/20 0659  NA 136  K 2.8*  CL 101  CO2 26  GLUCOSE 220*  BUN 10  CREATININE 0.63  CALCIUM 8.3*  GFRNONAA >60   CrCl cannot be calculated (Unknown ideal weight.).  CMP Latest Ref Rng & Units 09/21/2020 11/23/2016 11/14/2016  Glucose 70 - 99 mg/dL 154(M) 086(P) 619(J)  BUN 6 - 20 mg/dL 10 11 14   Creatinine 0.44 - 1.00 mg/dL 0.93 2.67  Sodium 135 - 145 mmol/L 136 139 142  Potassium 3.5 - 5.1 mmol/L 2.8(L) 3.5 3.6  Chloride 98 - 111 mmol/L 101 107 106  CO2 22 - 32 mmol/L 26 26 -  Calcium 8.9 - 10.3 mg/dL 8.3(L) 9.0 -  Total Protein 6.5 - 8.1 g/dL 6.6 - -  Total Bilirubin 0.3 - 1.2 mg/dL 0.8 - -  Alkaline Phos 38 - 126 U/L 70 - -  AST 15 - 41 U/L 58(H) - -  ALT 0 - 44 U/L 70(H) - -   CBC Latest Ref Rng & Units 09/21/2020 11/23/2016 11/14/2016  WBC 4.0 - 10.5 K/uL 17.6(H) 14.7(H) -  Hemoglobin 12.0 - 15.0 g/dL 11.2(L) 13.6 15.0  Hematocrit 36.0 - 46.0 % 33.2(L) 40.0 44.0  Platelets 150 - 400 K/uL 292 252 -   Lipid Panel     Component Value Date/Time   CHOL 146 09/21/2020 0659   TRIG 104 09/21/2020 0659   HDL 24 (L) 09/21/2020 0659   CHOLHDL 6.1 09/21/2020 0659   VLDL 21 09/21/2020 0659   LDLCALC 101 (H) 09/21/2020 0659   HEMOGLOBIN A1C No results found for: HGBA1C, MPG TSH No results for input(s): TSH in the last 8760 hours. BNP (last 3 results) No results for input(s): BNP in the last 8760 hours.  Medications and allergies   Allergies  Allergen Reactions   Hydrocodone Nausea And Vomiting   Other Other (See Comments)    States all pain medications make her very ill   Tyloxapol Nausea And Vomiting     sodium chloride     sodium chloride     No current outpatient medications  Radiology:   Cardiac Studies:   EKG 09/21/2020: Normal sinus rhythm, normal axis, diffuse  anterolateral and inferior STEMI.  Low-voltage complexes.  Assessment   1.  Acute anterolateral and inferior STEMI. 2.  Hypercholesterolemia 3.  Diabetes mellitus type 2 controlled without complications 4.  Tobacco use disorder  Recommendations:   Patient is a very poor historian.  Although symptoms ongoing for the past 3 days, presented to the emergency room early this morning with chest pain, EKG revealing STEMI.  Presently chest pain-free however clearly has significant EKG abnormality, will proceed with emergent cardiac catheterization.  She has poor peripheral pulses, suspect she probably has PAD.  But remains asymptomatic.  No critical limb ischemia.  Will resume control of diabetes and discuss cessation of tobacco use and intensify her statin dose.  Further recommendations to follow.  Addendum: Discussed with her friend Minerva Areola, patient had called about hemoptysis and wheezing and was recommended antibiotics and also steroid therapy by her PCP, there was significant improvement in wheezing and also cough and resolution of hemoptysis.  However patient started having chest pain sometime Thursday and had chest pain all over the weekend finally presenting to the emergency room.   Yates Decamp, MD, [facility] 09/21/2020, 9:17 AM Office: [phone] Fax: [phone] Pager: [phone]

## 2020-09-21 NOTE — Plan of Care (Signed)
  Problem: Clinical Measurements: Goal: Cardiovascular complication will be avoided Outcome: Progressing   Problem: Activity: Goal: Risk for activity intolerance will decrease Outcome: Progressing   Problem: Nutrition: Goal: Adequate nutrition will be maintained Outcome: Progressing   Problem: Pain Managment: Goal: General experience of comfort will improve Outcome: Progressing   Problem: Safety: Goal: Ability to remain free from injury will improve Outcome: Progressing   Problem: Skin Integrity: Goal: Risk for impaired skin integrity will decrease Outcome: Progressing   

## 2020-09-21 NOTE — TOC Benefit Eligibility Note (Signed)
Patient Advocate Encounter  Insurance verification completed.    The patient is uninsured  Elwood Bazinet, CPhT Pharmacy Patient Advocate Specialist [facility] Team Direct Number: [phone]  Fax: [phone]        

## 2020-09-22 ENCOUNTER — Other Ambulatory Visit (HOSPITAL_COMMUNITY): Payer: Self-pay

## 2020-09-22 LAB — GLUCOSE, CAPILLARY
Glucose-Capillary: 152 mg/dL — ABNORMAL HIGH (ref 70–99)
Glucose-Capillary: 160 mg/dL — ABNORMAL HIGH (ref 70–99)
Glucose-Capillary: 199 mg/dL — ABNORMAL HIGH (ref 70–99)

## 2020-09-22 LAB — CBC
HCT: 33.7 % — ABNORMAL LOW (ref 36.0–46.0)
Hemoglobin: 11.1 g/dL — ABNORMAL LOW (ref 12.0–15.0)
MCH: 30.5 pg (ref 26.0–34.0)
MCHC: 32.9 g/dL (ref 30.0–36.0)
MCV: 92.6 fL (ref 80.0–100.0)
Platelets: 286 10*3/uL (ref 150–400)
RBC: 3.64 MIL/uL — ABNORMAL LOW (ref 3.87–5.11)
RDW: 12.9 % (ref 11.5–15.5)
WBC: 15.2 10*3/uL — ABNORMAL HIGH (ref 4.0–10.5)
nRBC: 0 % (ref 0.0–0.2)

## 2020-09-22 LAB — BASIC METABOLIC PANEL
Anion gap: 8 (ref 5–15)
BUN: 11 mg/dL (ref 6–20)
CO2: 26 mmol/L (ref 22–32)
Calcium: 8.9 mg/dL (ref 8.9–10.3)
Chloride: 105 mmol/L (ref 98–111)
Creatinine, Ser: 0.69 mg/dL (ref 0.44–1.00)
GFR, Estimated: >60 mL/min (ref >60)
Glucose, Bld: 154 mg/dL — ABNORMAL HIGH (ref 70–99)
Potassium: 3.8 mmol/L (ref 3.5–5.1)
Sodium: 139 mmol/L (ref 135–145)

## 2020-09-22 LAB — MAGNESIUM: Magnesium: 2.1 mg/dL (ref 1.7–2.4)

## 2020-09-22 LAB — TROPONIN I (HIGH SENSITIVITY): Troponin I (High Sensitivity): 4342 ng/L (ref <18)

## 2020-09-22 MED ORDER — METOPROLOL SUCCINATE ER 25 MG PO TB24
25.0000 mg | ORAL_TABLET | Freq: Every day | ORAL | 0 refills | Status: DC
  Filled 2020-09-22: qty 30, 30d supply, fill #0

## 2020-09-22 MED ORDER — ATORVASTATIN CALCIUM 80 MG PO TABS
80.0000 mg | ORAL_TABLET | Freq: Every day | ORAL | 0 refills | Status: DC
  Filled 2020-09-22: qty 30, 30d supply, fill #0

## 2020-09-22 MED ORDER — LOSARTAN POTASSIUM 25 MG PO TABS
25.0000 mg | ORAL_TABLET | Freq: Every day | ORAL | 0 refills | Status: DC
  Filled 2020-09-22: qty 30, 30d supply, fill #0

## 2020-09-22 MED ORDER — ASPIRIN 81 MG PO CHEW
81.0000 mg | CHEWABLE_TABLET | Freq: Every day | ORAL | 0 refills | Status: DC
  Filled 2020-09-22: qty 30, 30d supply, fill #0

## 2020-09-22 MED ORDER — TICAGRELOR 90 MG PO TABS
90.0000 mg | ORAL_TABLET | Freq: Two times a day (BID) | ORAL | 0 refills | Status: DC
  Filled 2020-09-22: qty 60, 30d supply, fill #0

## 2020-09-22 MED FILL — Fentanyl Citrate Preservative Free (PF) Inj 100 MCG/2ML: INTRAMUSCULAR | Qty: 2 | Status: AC

## 2020-09-22 MED FILL — Midazolam HCl Inj 2 MG/2ML (Base Equivalent): INTRAMUSCULAR | Qty: 2 | Status: AC

## 2020-09-22 NOTE — Progress Notes (Signed)
TOC received consult for medication assistance for patient. TOC provided medication assistance application and 30 day free trial card to patients RN.

## 2020-09-22 NOTE — Discharge Summary (Signed)
Physician Discharge Summary  Patient ID: Alexandra Ward MRN: [id number] DOB/AGE: 1960/04/16 60 y.o. Alexandra Peri, MD   Admit date: 09/21/2020 Discharge date: 09/22/2020  Primary Discharge Diagnosis 1.  Acute anterolateral and inferior STEMI. 2.  Hypercholesterolemia 3.  Diabetes mellitus type 2 controlled without complications 4.  Tobacco use disorder  Significant Diagnostic Studies:  Echocardiogram 09/21/2020: 1. Left ventricular ejection fraction, by estimation, is 30 to 35%. The  left ventricle has moderately decreased function. The left ventricle  demonstrates regional wall motion abnormalities (see scoring  diagram/findings for description). Left ventricular   diastolic parameters are consistent with Grade II diastolic dysfunction  (pseudonormalization). Elevated left ventricular end-diastolic pressure.  The E/e' is 22. There is akinesis of the left ventricular, mid-apical  anterior wall, apical segment,  anteroseptal wall, inferoseptal wall and anterolateral wall. There is  akinesis of the left ventricular, apical inferior wall and apical segment.   2. Right ventricular systolic function is normal. The right ventricular  size is normal.   3. The mitral valve is normal in structure. No evidence of mitral valve  regurgitation. No evidence of mitral stenosis.   4. The aortic valve is tricuspid. Aortic valve regurgitation is not  visualized. Mild aortic valve sclerosis is present, with no evidence of  aortic valve stenosis.   5. The inferior vena cava is dilated in size with <50% respiratory  variability, suggesting right atrial pressure of 15 mmHg.   Coronary arteriogram 09/21/2020: LV hemodynamics: 121/18, EDP 21 mmHg.  Ao 126/74, mean 97 mmHg.  No pressure gradient across the aortic valve. LV: EF 30 to 35% with mid to distal anterior, anteroapical, apical and inferior apical akinesis to dyskinesis. LM: Large-caliber vessel.  Smooth and normal. LAD: Large vessel, just  immediately after the first septal perforator, the LAD is flush occluded.  Small diagonal arises at this segment.  The LAD otherwise is a large wraparound LAD and supplies large part of the apical and inferoapical region.  There are collaterals to the right coronary artery. CX: Moderate to large vessel, appears smooth and normal. RCA: Severely diseased diffusely, mid segment appears subtotally to totally occluded with TIMI I flow.  Distal bed of the RCA could not be seen due to minimal flow.  There are ipsilateral and contralateral collaterals to the distal right.   Intervention: Successful stenting of the proximal and mid segment of the LAD with a long 3.0 x 24 mm Synergy XD DES following aspiration thrombectomy, stent deployed at a peak pressure of 18 atmospheric pressure, stenosis reduced from 100% to 0% with stepup and stepdown and TIMI 0 to TIMI-3 flow at the end of the procedure. I did attempt to cross the RCA however extremely difficult and appeared to behave like a CTO, lesion left alone.  Patient was completely asymptomatic throughout the procedure.  EKG 09/21/2020: Normal sinus rhythm, normal axis, diffuse anterolateral and inferior STEMI.  Low-voltage complexes.  Radiology Reviewed   Hospital Course: Alexandra Ward is a 60 y.o. female  patient with history of hyperlipidemia, diabetes, tobacco use disorder, and history of left vertebral artery dissection and stroke in 2018.  Patient presented from [facility] as a code STEMI on 09/21/2020.  Patient was emergently taken for cardiac catheterization.  Coronary angiography revealed occluded LAD as well as severely diffusely diseased RCA.  Patient underwent aspiration thrombectomy of LAD and subsequent stenting.  She was then started on dual antiplatelet therapy with aspirin and Brilinta, recommend minimum of 12 months.  Patient was initiated on guideline  directed medical therapy for CAD including aspirin, atorvastatin, Brilinta,  losartan, metoprolol succinate.  Upon examination this morning patient is frustrated that she is still in the hospital and is insisting on leaving AGAINST MEDICAL ADVICE.  Patient refused to provide much history to me, however she has had no recurrence of chest pain since cardiac catheterization.  Patient also refused to allow me to complete physical examination this morning.  However patient's vitals are stable and she is tolerating guideline directed therapy without issue.  Advised patient regarding the importance of medication compliance.  Also recommended patient stay in the hospital for further observation and management given high risk CAD.  However she verbalized understanding of this risk and still wishes to leave the hospital AGAINST MEDICAL ADVICE.  Recommendations on discharge:  Continue guideline directed medical therapy for CAD including aspirin losartan, metoprolol, and atorvastatin.  Recommend continuation of dual antiplatelet therapy with aspirin and Brilinta for minimum of 12 months if tolerated.  Patient wishes to follow-up with cardiologist Dr. Windell Moulding as it is closer to her home.   Discharge Exam: Vitals with BMI 09/22/2020 09/22/2020 09/22/2020  Height - - -  Weight - - 187 lbs 6 oz  BMI - - 32.15  Systolic 123 93 -  Diastolic 77 62 -  Pulse - - -     Physical Exam Patient refused full physical exam this morning:  Patient is resting comfortably and in no acute distress.  Labs:   Lab Results  Component Value Date   WBC 15.2 (H) 09/22/2020   HGB 11.1 (L) 09/22/2020   HCT 33.7 (L) 09/22/2020   MCV 92.6 09/22/2020   PLT 286 09/22/2020    Recent Labs  Lab 09/21/20 0659 09/22/20 0039  NA 140  136 139  K 2.9*  2.8* 3.8  CL 100  101 105  CO2 26 26  BUN 11  10 11   CREATININE 0.50  0.63 0.69  CALCIUM 8.3* 8.9  PROT 6.6  --   BILITOT 0.8  --   ALKPHOS 70  --   ALT 70*  --   AST 58*  --   GLUCOSE 221*  220* 154*    Lipid Panel     Component Value  Date/Time   CHOL 146 09/21/2020 0659   TRIG 104 09/21/2020 0659   HDL 24 (L) 09/21/2020 0659   CHOLHDL 6.1 09/21/2020 0659   VLDL 21 09/21/2020 0659   LDLCALC 101 (H) 09/21/2020 0659    BNP (last 3 results) No results for input(s): BNP in the last 8760 hours.  HEMOGLOBIN A1C Lab Results  Component Value Date   HGBA1C 7.4 (H) 09/21/2020   MPG 166 09/21/2020    Cardiac Panel  BNP No results found for: BNP  ProBNP No results found for: PROBNP   TSH No results for input(s): TSH in the last 8760 hours.  FOLLOW UP PLANS AND APPOINTMENTS Discharge Instructions     AMB Referral to Cardiac Rehabilitation - Phase II   Complete by: As directed    Diagnosis: STEMI   After initial evaluation and assessments completed: Virtual Based Care may be provided alone or in conjunction with Phase 2 Cardiac Rehab based on patient barriers.: Yes      Allergies as of 09/22/2020       Reactions   Hydrocodone Nausea And Vomiting   Other Nausea And Vomiting, Other (See Comments)   States all pain medications make her very ill- vomits   Tyloxapol Nausea And Vomiting  Prednisone Rash, Other (See Comments)   Chest pain and a short-lived rash on the neck        Medication List     STOP taking these medications    aspirin 325 MG tablet Replaced by: Aspirin Low Dose 81 MG chewable tablet   NICODERM CQ TD   pravastatin 10 MG tablet Commonly known as: PRAVACHOL       TAKE these medications    Aspirin Low Dose 81 MG chewable tablet Generic drug: aspirin Chew 1 tablet (81 mg total) by mouth daily. Start taking on: September 23, 2020 Replaces: aspirin 325 MG tablet   atorvastatin 80 MG tablet Commonly known as: LIPITOR Take 1 tablet (80 mg total) by mouth daily at 8 pm.   Brilinta 90 MG Tabs tablet Generic drug: ticagrelor Take 1 tablet (90 mg total) by mouth 2 (two) times daily.   levofloxacin 750 MG tablet Commonly known as: LEVAQUIN Take 750 mg by mouth daily.    losartan 25 MG tablet Commonly known as: COZAAR Take 1 tablet (25 mg total) by mouth daily. Start taking on: September 23, 2020   Lumify 0.025 % Soln Generic drug: Brimonidine Tartrate Place 1 drop into both eyes daily as needed (for irritation).   metFORMIN 500 MG tablet Commonly known as: GLUCOPHAGE Take 500-1,000 mg by mouth See admin instructions. Take 1,000 mg by mouth in the morning, 500 mg in the evening, and 500 mg at bedtime   metoprolol succinate 25 MG 24 hr tablet Commonly known as: TOPROL-XL Take 1 tablet (25 mg total) by mouth daily. Start taking on: September 23, 2020        Follow-up Information     Alexandra Peri, MD. Go on 10/02/2020.   Specialty: Internal Medicine Why: at 12:45pm for hospital follow up Contact information: [address] [place] [address] [phone]                Patient was seen in collaboration with Dr. Jacinto Halim. He also reviewed patient's chart and examined the patient. Dr. Jacinto Halim is in agreement of the plan.    Rayford Halsted, MD, [facility] 09/22/2020, 12:09 PM Office: [phone]

## 2020-09-22 NOTE — Plan of Care (Signed)

## 2020-09-22 NOTE — Progress Notes (Signed)
Mrs. Sowder has removed the monitor leads and refuses to allow them to be replaced.    She is also requesting that her peripheral IVs be removed.  She agreed to allow those to remain in place until her son arrives.  Actively collaborating with cardiac rehab, care team, [facility] and pharmacy to obtain Brilinta Rx as soon as possible.

## 2020-09-22 NOTE — Progress Notes (Signed)
EKG CRITICAL VALUE     12 lead EKG performed.  Critical value noted.  Nickola Major, RN notified.   Edmonia Caprio, CCT 09/22/2020 7:50 AM

## 2020-09-22 NOTE — Progress Notes (Addendum)
Pt eager to d/c this am. Declined ambulation, not on telemetry per her request. Discussed MI, stent, importance of Brilinta, restrictions. She was receptive. Gave her materials for smoking cessation, diet, and exercise, all of which she declined discussing in depth. Discussed CRPII, pt is not interested. Referral already placed for [facility] CRPII to meet requirements. Contacted TOC for assist with meds, who is aware that pt is eager for d/c.  [phone] Ethelda Chick CES, ACSM 9:47 AM 09/22/2020

## 2020-09-22 NOTE — Progress Notes (Signed)
Upon initial meet and attempted assessment of patient, she stated, "Ain't no need to be doing a bunch of stuff in here because I'm leaving today, as soon as my son gets here.  I don't have insurance and I'm not paying another $30,000 when I can do the same thing at home."  She would only allow me to assess vital signs, which were stable, and 12 lead EKG obtained with expected findings.  I contacted Dr. Jacinto Halim to request that he try and see the patient ASAP, and I contacted [facility] and left voicemail requesting them to expedite Brilinta prescription.  I administered 10:00 meds early, including Brilinta.  I attempted to explain the importance of maintaining compliance with this medication in particular, following DES placement, and that [facility] would assist her in obtaining 30 days of it at no cost to her.  The patient did not allow me to finish speaking, stating, "Those tactics don't work on me.  Don't tell me I might have another heart attack.  I'm 60 years old.  I know my rights.  I'm not staying here another day."  Dr. Jacinto Halim and Elvin So, PA aware of situation.

## 2020-09-22 NOTE — Progress Notes (Signed)
Contacted by Infection Prevention to clarify pt's isolation status.  Per pt, she initially tested positive for COVID on 08/31/20 at an outside facility.  For the current hospitalization, she was asymptomatic but still tested positive.  Admitting RN contacted IP, and were told pt no longer requires isolation precautions.

## 2020-09-22 NOTE — Progress Notes (Signed)
Peripheral IV lines removed per pt's request.  Medications expeditiously delivered by [facility] pharmacy personnel, their assistance much appreciated.  Paperwork for Brilinta assistance explained to pt and her son, with emphasis placed on timeliness of completion.  AMA paperwork explained to Mrs. Zinni and signed by her in the presence of her son and myself.  Witnessed by me.

## 2020-09-23 ENCOUNTER — Telehealth: Payer: Self-pay

## 2020-09-23 NOTE — Telephone Encounter (Signed)
Location of hospitalization: [facility] Reason for hospitalization: chest pains Date of discharge: 09/23/20 Date of first communication with patient: today Person contacting patient: Erby Pian, CMA Current symptoms: fatigue, soreness Do you understand why you were in the Hospital: Yes Questions regarding discharge instructions: None Where were you discharged to: Home Medications reviewed: Yes Allergies reviewed: Yes Dietary changes reviewed: Yes. Discussed low fat and low salt diet.  Referals reviewed: NA Activities of Daily Living: Able to with mild limitations Any transportation issues/concerns: None Any patient concerns: None Confirmed importance & date/time of Follow up appt: Yes Confirmed with patient if condition begins to worsen call. Pt was given the office number and encouraged to call back with questions or concerns: Yes

## 2020-09-28 ENCOUNTER — Inpatient Hospital Stay (HOSPITAL_COMMUNITY): Payer: Self-pay

## 2020-09-28 ENCOUNTER — Encounter (HOSPITAL_COMMUNITY): Payer: Self-pay | Admitting: Cardiology

## 2020-09-28 ENCOUNTER — Inpatient Hospital Stay (HOSPITAL_COMMUNITY)
Admission: EM | Admit: 2020-09-28 | Discharge: 2020-09-29 | DRG: 282 | Disposition: A | Discharge status: Home / Self Care | Payer: Self-pay | Attending: Cardiology | Admitting: Cardiology

## 2020-09-28 ENCOUNTER — Other Ambulatory Visit (HOSPITAL_COMMUNITY): Payer: Self-pay

## 2020-09-28 DIAGNOSIS — Z955 Presence of coronary angioplasty implant and graft: Secondary | ICD-10-CM

## 2020-09-28 DIAGNOSIS — Z79899 Other long term (current) drug therapy: Secondary | ICD-10-CM

## 2020-09-28 DIAGNOSIS — Z7984 Long term (current) use of oral hypoglycemic drugs: Secondary | ICD-10-CM

## 2020-09-28 DIAGNOSIS — I2109 ST elevation (STEMI) myocardial infarction involving other coronary artery of anterior wall: Secondary | ICD-10-CM | POA: Diagnosis present

## 2020-09-28 DIAGNOSIS — F17210 Nicotine dependence, cigarettes, uncomplicated: Secondary | ICD-10-CM | POA: Diagnosis present

## 2020-09-28 DIAGNOSIS — I2510 Atherosclerotic heart disease of native coronary artery without angina pectoris: Secondary | ICD-10-CM | POA: Diagnosis present

## 2020-09-28 DIAGNOSIS — Z7901 Long term (current) use of anticoagulants: Secondary | ICD-10-CM

## 2020-09-28 DIAGNOSIS — D72829 Elevated white blood cell count, unspecified: Secondary | ICD-10-CM

## 2020-09-28 DIAGNOSIS — Z28310 Unvaccinated for covid-19: Secondary | ICD-10-CM

## 2020-09-28 DIAGNOSIS — Z8673 Personal history of transient ischemic attack (TIA), and cerebral infarction without residual deficits: Secondary | ICD-10-CM

## 2020-09-28 DIAGNOSIS — Z7982 Long term (current) use of aspirin: Secondary | ICD-10-CM

## 2020-09-28 DIAGNOSIS — D72828 Other elevated white blood cell count: Secondary | ICD-10-CM | POA: Diagnosis present

## 2020-09-28 DIAGNOSIS — Z8616 Personal history of COVID-19: Secondary | ICD-10-CM

## 2020-09-28 DIAGNOSIS — E119 Type 2 diabetes mellitus without complications: Secondary | ICD-10-CM | POA: Diagnosis present

## 2020-09-28 DIAGNOSIS — Z7902 Long term (current) use of antithrombotics/antiplatelets: Secondary | ICD-10-CM

## 2020-09-28 DIAGNOSIS — I4891 Unspecified atrial fibrillation: Principal | ICD-10-CM

## 2020-09-28 DIAGNOSIS — E782 Mixed hyperlipidemia: Secondary | ICD-10-CM | POA: Diagnosis present

## 2020-09-28 LAB — HIV ANTIBODY (ROUTINE TESTING W REFLEX): HIV Screen 4th Generation wRfx: NONREACTIVE

## 2020-09-28 LAB — GLUCOSE, CAPILLARY
Glucose-Capillary: 186 mg/dL — ABNORMAL HIGH (ref 70–99)
Glucose-Capillary: 157 mg/dL — ABNORMAL HIGH (ref 70–99)
Glucose-Capillary: 172 mg/dL — ABNORMAL HIGH (ref 70–99)
Glucose-Capillary: 135 mg/dL — ABNORMAL HIGH (ref 70–99)

## 2020-09-28 LAB — BASIC METABOLIC PANEL
Anion gap: 7 (ref 5–15)
BUN: 10 mg/dL (ref 6–20)
CO2: 24 mmol/L (ref 22–32)
Calcium: 8.5 mg/dL — ABNORMAL LOW (ref 8.9–10.3)
Chloride: 109 mmol/L (ref 98–111)
Creatinine, Ser: 0.72 mg/dL (ref 0.44–1.00)
GFR, Estimated: >60 mL/min (ref >60)
Glucose, Bld: 177 mg/dL — ABNORMAL HIGH (ref 70–99)
Potassium: 3.6 mmol/L (ref 3.5–5.1)
Sodium: 140 mmol/L (ref 135–145)

## 2020-09-28 LAB — CBC
HCT: 36.7 % (ref 36.0–46.0)
Hemoglobin: 11.9 g/dL — ABNORMAL LOW (ref 12.0–15.0)
MCH: 29.1 pg (ref 26.0–34.0)
MCHC: 32.4 g/dL (ref 30.0–36.0)
MCV: 89.7 fL (ref 80.0–100.0)
Platelets: 401 10*3/uL — ABNORMAL HIGH (ref 150–400)
RBC: 4.09 MIL/uL (ref 3.87–5.11)
RDW: 13.1 % (ref 11.5–15.5)
WBC: 19.3 10*3/uL — ABNORMAL HIGH (ref 4.0–10.5)
nRBC: 0 % (ref 0.0–0.2)

## 2020-09-28 MED ORDER — APIXABAN 5 MG PO TABS
5.0000 mg | ORAL_TABLET | Freq: Two times a day (BID) | ORAL | Status: DC
  Administered 2020-09-28 (×2): 5 mg via ORAL
  Filled 2020-09-28 (×2): qty 1

## 2020-09-28 MED ORDER — AMIODARONE HCL IN DEXTROSE 360-4.14 MG/200ML-% IV SOLN
30.0000 mg/h | INTRAVENOUS | Status: AC
  Administered 2020-09-28: 30 mg/h via INTRAVENOUS
  Filled 2020-09-28 (×2): qty 200

## 2020-09-28 MED ORDER — CLOPIDOGREL BISULFATE 75 MG PO TABS: 75.0000 mg | ORAL_TABLET | Freq: Every day | ORAL | Status: DC

## 2020-09-28 MED ORDER — ACETAMINOPHEN 325 MG PO TABS: 650.0000 mg | ORAL_TABLET | ORAL | Status: DC | PRN

## 2020-09-28 MED ORDER — ASPIRIN 81 MG PO CHEW
81.0000 mg | CHEWABLE_TABLET | Freq: Every day | ORAL | Status: DC
  Administered 2020-09-28: 81 mg via ORAL
  Filled 2020-09-28: qty 1

## 2020-09-28 MED ORDER — AMIODARONE HCL IN DEXTROSE 360-4.14 MG/200ML-% IV SOLN
60.0000 mg/h | INTRAVENOUS | Status: AC
  Administered 2020-09-28 (×2): 60 mg/h via INTRAVENOUS
  Filled 2020-09-28: qty 200

## 2020-09-28 MED ORDER — HEPARIN BOLUS VIA INFUSION
4000.0000 [IU] | Freq: Once | INTRAVENOUS | Status: AC
  Administered 2020-09-28: 4000 [IU] via INTRAVENOUS
  Filled 2020-09-28: qty 4000

## 2020-09-28 MED ORDER — ATORVASTATIN CALCIUM 80 MG PO TABS
80.0000 mg | ORAL_TABLET | Freq: Every day | ORAL | Status: DC
  Administered 2020-09-28: 80 mg via ORAL
  Filled 2020-09-28: qty 1

## 2020-09-28 MED ORDER — AMIODARONE HCL 200 MG PO TABS
200.0000 mg | ORAL_TABLET | Freq: Two times a day (BID) | ORAL | 1 refills | Status: DC
  Filled 2020-09-28: qty 60, 30d supply, fill #0

## 2020-09-28 MED ORDER — ONDANSETRON HCL 4 MG/2ML IJ SOLN: 4.0000 mg | Freq: Four times a day (QID) | INTRAMUSCULAR | Status: DC | PRN

## 2020-09-28 MED ORDER — CLOPIDOGREL BISULFATE 75 MG PO TABS
75.0000 mg | ORAL_TABLET | Freq: Every day | ORAL | 1 refills | Status: DC
  Filled 2020-09-28: qty 30, 30d supply, fill #0

## 2020-09-28 MED ORDER — AMIODARONE HCL 200 MG PO TABS
200.0000 mg | ORAL_TABLET | Freq: Two times a day (BID) | ORAL | Status: DC
  Administered 2020-09-28: 200 mg via ORAL
  Filled 2020-09-28 (×2): qty 1

## 2020-09-28 MED ORDER — AMIODARONE LOAD VIA INFUSION
150.0000 mg | Freq: Once | INTRAVENOUS | Status: AC
  Administered 2020-09-28: 150 mg via INTRAVENOUS
  Filled 2020-09-28: qty 83.34

## 2020-09-28 MED ORDER — CLOPIDOGREL BISULFATE 300 MG PO TABS: 600.0000 mg | ORAL_TABLET | Freq: Once | ORAL | Status: DC

## 2020-09-28 MED ORDER — HEPARIN (PORCINE) 25000 UT/250ML-% IV SOLN
1050.0000 [IU]/h | INTRAVENOUS | Status: DC
  Administered 2020-09-28: 1050 [IU]/h via INTRAVENOUS
  Filled 2020-09-28: qty 250

## 2020-09-28 MED ORDER — INSULIN ASPART 100 UNIT/ML IJ SOLN
3.0000 [IU] | Freq: Three times a day (TID) | INTRAMUSCULAR | Status: DC
  Administered 2020-09-28 – 2020-09-29 (×3): 3 [IU] via SUBCUTANEOUS

## 2020-09-28 MED ORDER — APIXABAN 5 MG PO TABS
5.0000 mg | ORAL_TABLET | Freq: Two times a day (BID) | ORAL | 1 refills | Status: DC
  Filled 2020-09-28: qty 60, 30d supply, fill #0

## 2020-09-28 MED ORDER — INSULIN ASPART 100 UNIT/ML IJ SOLN: 0.0000 [IU] | Freq: Every day | INTRAMUSCULAR | Status: DC

## 2020-09-28 MED ORDER — INSULIN ASPART 100 UNIT/ML IJ SOLN
0.0000 [IU] | Freq: Three times a day (TID) | INTRAMUSCULAR | Status: DC
  Administered 2020-09-28 – 2020-09-29 (×3): 2 [IU] via SUBCUTANEOUS

## 2020-09-28 NOTE — ED Triage Notes (Signed)
Pt brought to ED via EMS from home with c/o CP, lightheadedness that began around 2:15AM. Pt reports chest pain was central with no radiation, rated 4/10, resolved PTA. Pt alert and oriented on arrival to ED, afib RVR rate 170-190s. Cardiac stent placed on 6/20, bruising to right arm from radial entry site. Pt reports she took 6 81mg  aspirin PTA.  EMS v/s: 114/40 66 pulse HR 160s-190s 97% on room air

## 2020-09-28 NOTE — Plan of Care (Signed)
  Problem: Coping: Goal: Level of anxiety will decrease Outcome: Not Progressing   

## 2020-09-28 NOTE — ED Provider Notes (Signed)
[facility] EMERGENCY DEPARTMENT Provider Note   CSN: [id number] Arrival date & time: 09/28/20  0505     History Chief Complaint  Patient presents with   Chest Pain    Alexandra Ward is a 60 y.o. female.  Patient is a 60 year old female with past medical history of coronary artery disease with STEMI 1 week ago.  Patient had a stent placed in her LAD, then left AGAINST MEDICAL ADVICE before her hospitalization was complete.  Patient presents today with complaints of palpitations.  This started approximately 2 AM and woke her from sleep.  EMS was called and patient was found to be in A. fib with RVR.  Due to the appearance of her EKG, code STEMI was initiated prehospital, but canceled upon arrival.  She denies to me she is experiencing any chest pain, but does feel short of breath and weak.  The history is provided by the patient.      Past Medical History:  Diagnosis Date   Diabetes mellitus without complication (HCC)    Hyperlipidemia    Stroke [facility])    Vertebral artery dissection 2018    Patient Active Problem List   Diagnosis Date Noted   A-fib [facility]) 09/28/2020   CAD (coronary artery disease), native coronary artery 09/21/2020   Hypercholesteremia 09/21/2020   Type 2 diabetes mellitus without complication, without long-term current use of insulin (HCC) 09/21/2020   Acute ST elevation myocardial infarction (STEMI) of anterolateral wall (HCC) 09/21/2020   Acute ischemic stroke (HCC) 11/23/2016   Tobacco abuse 11/23/2016    Past Surgical History:  Procedure Laterality Date   CORONARY THROMBECTOMY N/A 09/21/2020   Procedure: Coronary Thrombectomy;  Surgeon: Yates Decamp, MD;  Location: [facility] INVASIVE CV LAB;  Service: Cardiovascular;  Laterality: N/A;   CORONARY/GRAFT ACUTE MI REVASCULARIZATION N/A 09/21/2020   Procedure: Coronary/Graft Acute MI Revascularization;  Surgeon: Yates Decamp, MD;  Location: [facility] INVASIVE CV LAB;  Service: Cardiovascular;  Laterality: N/A;    LEFT HEART CATH AND CORONARY ANGIOGRAPHY N/A 09/21/2020   Procedure: LEFT HEART CATH AND CORONARY ANGIOGRAPHY;  Surgeon: Yates Decamp, MD;  Location: [facility] INVASIVE CV LAB;  Service: Cardiovascular;  Laterality: N/A;     OB History   No obstetric history on file.     No family history on file.  Social History   Tobacco Use   Smoking status: Every Day    Pack years: 0.00    Types: Cigarettes   Smokeless tobacco: Never  Substance Use Topics   Alcohol use: No   Drug use: No    Home Medications Prior to Admission medications   Medication Sig Start Date End Date Taking? Authorizing Provider  aspirin 81 MG chewable tablet Chew 1 tablet (81 mg total) by mouth daily. 09/23/20   Cantwell, Celeste C, PA-C  atorvastatin (LIPITOR) 80 MG tablet Take 1 tablet (80 mg total) by mouth daily at 8 pm. 09/22/20   Cantwell, Celeste C, PA-C  losartan (COZAAR) 25 MG tablet Take 1 tablet (25 mg total) by mouth daily. 09/23/20   Cantwell, Celeste C, PA-C  LUMIFY 0.025 % SOLN Place 1 drop into both eyes daily as needed (for irritation).    [provider]  metFORMIN (GLUCOPHAGE) 500 MG tablet Take 500-1,000 mg by mouth See admin instructions. Take 1,000 mg by mouth in the morning, 500 mg in the evening, and 500 mg at bedtime    [provider]  metoprolol succinate (TOPROL-XL) 25 MG 24 hr tablet Take 1 tablet (25  mg total) by mouth daily. 09/23/20   Cantwell, Celeste C, PA-C  ticagrelor (BRILINTA) 90 MG TABS tablet Take 1 tablet (90 mg total) by mouth 2 (two) times daily. 09/22/20   Cantwell, Celeste C, PA-C    Allergies    Hydrocodone, Other, Tyloxapol, and Prednisone  Review of Systems   Review of Systems  All other systems reviewed and are negative.  Physical Exam Updated Vital Signs BP 101/67   Pulse (!) 42   Temp 98.5 F (36.9 C) (Oral)   Resp (!) 23   Ht 5\' 4"  (1.626 m)   Wt 85 kg   SpO2 97%   BMI 32.17 kg/m   Physical Exam Vitals and nursing note reviewed.   Constitutional:      General: She is not in acute distress.    Appearance: She is well-developed. She is not diaphoretic.  HENT:     Head: Normocephalic and atraumatic.  Cardiovascular:     Rate and Rhythm: Tachycardia present. Rhythm irregular.     Heart sounds: No murmur heard.   No friction rub. No gallop.  Pulmonary:     Effort: Pulmonary effort is normal. No respiratory distress.     Breath sounds: Normal breath sounds. No wheezing.  Abdominal:     General: Bowel sounds are normal. There is no distension.     Palpations: Abdomen is soft.     Tenderness: There is no abdominal tenderness.  Musculoskeletal:        General: Normal range of motion.     Cervical back: Normal range of motion and neck supple.     Right lower leg: No tenderness. No edema.     Left lower leg: No tenderness. No edema.  Skin:    General: Skin is warm and dry.  Neurological:     General: No focal deficit present.     Mental Status: She is alert and oriented to person, place, and time.    ED Results / Procedures / Treatments   Labs (all labs ordered are listed, but only abnormal results are displayed) Labs Reviewed  HIV ANTIBODY (ROUTINE TESTING W REFLEX)    EKG EKG Interpretation  Date/Time:  Monday September 28 2020 05:08:50 EDT Ventricular Rate:  199 PR Interval:    QRS Duration: 98 QT Interval:  247 QTC Calculation: 450 R Axis:   140 Text Interpretation: Atrial fibrillation with rapid ventricular response Probable lateral infarct, age indeterminate Confirmed by [id number] (Geoffery Lyons) on 09/28/2020 5:21:44 AM  Radiology No results found.  Procedures Procedures   Medications Ordered in ED Medications  amiodarone (NEXTERONE) 1.8 mg/mL load via infusion 150 mg (150 mg Intravenous Bolus from Bag 09/28/20 0528)    Followed by  amiodarone (NEXTERONE PREMIX) 360-4.14 MG/200ML-% (1.8 mg/mL) IV infusion (has no administration in time range)    Followed by  amiodarone (NEXTERONE PREMIX) 360-4.14  MG/200ML-% (1.8 mg/mL) IV infusion (has no administration in time range)  aspirin chewable tablet 81 mg (has no administration in time range)  atorvastatin (LIPITOR) tablet 80 mg (has no administration in time range)  clopidogrel (PLAVIX) tablet 600 mg (has no administration in time range)    Followed by  clopidogrel (PLAVIX) tablet 75 mg (has no administration in time range)  acetaminophen (TYLENOL) tablet 650 mg (has no administration in time range)  ondansetron (ZOFRAN) injection 4 mg (has no administration in time range)    ED Course  I have reviewed the triage vital signs and the nursing notes.  Pertinent labs &  imaging results that were available during my care of the patient were reviewed by me and considered in my medical decision making (see chart for details).    MDM Rules/Calculators/A&P  Patient brought by EMS complaining of palpitations in the setting of recent stenting.  EKG shows A. fib with RVR.  Patient seen immediately on arrival by Dr. Riccardo Dubin from cardiology.  He is recommending an amiodarone drip which has been initiated.  Patient to be admitted to the cardiology service for further care.  CRITICAL CARE Performed by: Geoffery Lyons Total critical care time: 35 minutes Critical care time was exclusive of separately billable procedures and treating other patients. Critical care was necessary to treat or prevent imminent or life-threatening deterioration. Critical care was time spent personally by me on the following activities: development of treatment plan with patient and/or surrogate as well as nursing, discussions with consultants, evaluation of patient's response to treatment, examination of patient, obtaining history from patient or surrogate, ordering and performing treatments and interventions, ordering and review of laboratory studies, ordering and review of radiographic studies, pulse oximetry and re-evaluation of patient's condition.   Final Clinical  Impression(s) / ED Diagnoses Final diagnoses:  None    Rx / DC Orders ED Discharge Orders     None        Geoffery Lyons, MD 09/28/20 [phone]

## 2020-09-28 NOTE — H&P (Addendum)
Alexandra Ward is an 60 y.o. female.   Chief Complaint: Chest pain  HPI:   60 y.o. Caucasian female  with hyperlipidemia, type 2 DM, h/o stroke (vertebral artery dissection 2018), STEMI (09/21/2020) treated with PPCI to prox LAD, now admitted with chest pain, lightheadedness.  Patient had been doing fairly well since her discharge on 09/22/2020, has been compliant with DAPT. Earlier this morning, patient woke up feeling lightheaded and with central chest pain. She took 4 Aspirin, with which pain reportedly resolved. Patient has been chest pain free since prior to arrival to ER. She was found to be in Afib w/RVR with ventricular rate upwards of 170s. She denies any chest pain, shortness of breath,   Past Medical History:  Diagnosis Date   Diabetes mellitus without complication (HCC)    Hyperlipidemia    Stroke [facility])    Vertebral artery dissection 2018    Past Surgical History:  Procedure Laterality Date   CORONARY THROMBECTOMY N/A 09/21/2020   Procedure: Coronary Thrombectomy;  Surgeon: Yates Decamp, MD;  Location: [facility] INVASIVE CV LAB;  Service: Cardiovascular;  Laterality: N/A;   CORONARY/GRAFT ACUTE MI REVASCULARIZATION N/A 09/21/2020   Procedure: Coronary/Graft Acute MI Revascularization;  Surgeon: Yates Decamp, MD;  Location: [facility] INVASIVE CV LAB;  Service: Cardiovascular;  Laterality: N/A;   LEFT HEART CATH AND CORONARY ANGIOGRAPHY N/A 09/21/2020   Procedure: LEFT HEART CATH AND CORONARY ANGIOGRAPHY;  Surgeon: Yates Decamp, MD;  Location: [facility] INVASIVE CV LAB;  Service: Cardiovascular;  Laterality: N/A;    History reviewed. No pertinent family history.  Social History:  reports that she has been smoking cigarettes. She has never used smokeless tobacco. She reports that she does not drink alcohol and does not use drugs.  Allergies:  Allergies  Allergen Reactions   Hydrocodone Nausea And Vomiting   Other Nausea And Vomiting and Other (See Comments)    States all pain medications make her very  ill- vomits   Tyloxapol Nausea And Vomiting   Prednisone Rash and Other (See Comments)    Chest pain and a short-lived rash on the neck    Review of Systems  Constitutional: Negative for decreased appetite, malaise/fatigue, weight gain and weight loss.  HENT:  Negative for congestion.   Eyes:  Negative for visual disturbance.  Cardiovascular:  Negative for chest pain, dyspnea on exertion, leg swelling, palpitations and syncope.  Respiratory:  Negative for cough.   Endocrine: Negative for cold intolerance.  Hematologic/Lymphatic: Does not bruise/bleed easily.  Skin:  Negative for itching and rash.  Musculoskeletal:  Negative for myalgias.  Gastrointestinal:  Negative for abdominal pain, nausea and vomiting.  Genitourinary:  Negative for dysuria.  Neurological:  Positive for light-headedness. Negative for dizziness and weakness.  Psychiatric/Behavioral:  The patient is not nervous/anxious.   All other systems reviewed and are negative.   Blood pressure 101/67, pulse (!) 42, temperature 98.5 F (36.9 C), temperature source Oral, resp. rate (!) 23, height 5\' 4"  (1.626 m), weight 85 kg, SpO2 97 %. Body mass index is 32.17 kg/m.  Physical Exam Vitals and nursing note reviewed.  Constitutional:      General: She is not in acute distress.    Appearance: She is well-developed.  HENT:     Head: Normocephalic and atraumatic.  Eyes:     Conjunctiva/sclera: Conjunctivae normal.     Pupils: Pupils are equal, round, and reactive to light.  Neck:     Vascular: No JVD.  Cardiovascular:     Rate and Rhythm: Tachycardia present.  Rhythm irregular.     Pulses: Intact distal pulses.          Dorsalis pedis pulses are 0 on the right side and 0 on the left side.       Posterior tibial pulses are 0 on the right side and 0 on the left side.     Heart sounds: No murmur heard. Pulmonary:     Effort: Pulmonary effort is normal.     Breath sounds: Normal breath sounds. No wheezing or rales.   Abdominal:     General: Bowel sounds are normal.     Palpations: Abdomen is soft.     Tenderness: There is no rebound.  Musculoskeletal:        General: No tenderness. Normal range of motion.     Right lower leg: No edema.     Left lower leg: No edema.  Lymphadenopathy:     Cervical: No cervical adenopathy.  Skin:    General: Skin is warm and dry.     Findings: Bruising (Right forearm) present.  Neurological:     Mental Status: She is alert and oriented to person, place, and time.     Cranial Nerves: No cranial nerve deficit.    Labs:   Lab Results  Component Value Date   WBC 15.2 (H) 09/22/2020   HGB 11.1 (L) 09/22/2020   HCT 33.7 (L) 09/22/2020   MCV 92.6 09/22/2020   PLT 286 09/22/2020    Recent Labs  Lab 09/21/20 0659 09/22/20 0039  NA 140  136 139  K 2.9*  2.8* 3.8  CL 100  101 105  CO2 26 26  BUN 11  10 11   CREATININE 0.50  0.63 0.69  CALCIUM 8.3* 8.9  PROT 6.6  --   BILITOT 0.8  --   ALKPHOS 70  --   ALT 70*  --   AST 58*  --   GLUCOSE 221*  220* 154*    Lipid Panel     Component Value Date/Time   CHOL 146 09/21/2020 0659   TRIG 104 09/21/2020 0659   HDL 24 (L) 09/21/2020 0659   CHOLHDL 6.1 09/21/2020 0659   VLDL 21 09/21/2020 0659   LDLCALC 101 (H) 09/21/2020 0659    BNP (last 3 results) No results for input(s): BNP in the last 8760 hours.  HEMOGLOBIN A1C Lab Results  Component Value Date   HGBA1C 7.4 (H) 09/21/2020   MPG 166 09/21/2020    Cardiac Panel (last 3 results) Pending  TSH Pending    Current Facility-Administered Medications:    acetaminophen (TYLENOL) tablet 650 mg, 650 mg, Oral, Q4H PRN, Tanja Gift J, MD   amiodarone (NEXTERONE) 1.8 mg/mL load via infusion 150 mg, 150 mg, Intravenous, Once **FOLLOWED BY** amiodarone (NEXTERONE PREMIX) 360-4.14 MG/200ML-% (1.8 mg/mL) IV infusion, 60 mg/hr, Intravenous, Continuous **FOLLOWED BY** amiodarone (NEXTERONE PREMIX) 360-4.14 MG/200ML-% (1.8 mg/mL) IV infusion, 30  mg/hr, Intravenous, Continuous, Natelie Ostrosky J, MD   aspirin chewable tablet 81 mg, 81 mg, Oral, Daily, Dasia Guerrier J, MD   atorvastatin (LIPITOR) tablet 80 mg, 80 mg, Oral, Q2000, Lechelle Wrigley J, MD   clopidogrel (PLAVIX) tablet 600 mg, 600 mg, Oral, Once **FOLLOWED BY** [START ON 09/29/2020] clopidogrel (PLAVIX) tablet 75 mg, 75 mg, Oral, Daily, Desirae Mancusi J, MD   ondansetron (ZOFRAN) injection 4 mg, 4 mg, Intravenous, Q6H PRN, Valgene Deloatch J, MD  Current Outpatient Medications:    aspirin 81 MG chewable tablet, Chew 1 tablet (81 mg total) by mouth  daily., Disp: 30 tablet, Rfl: 0   atorvastatin (LIPITOR) 80 MG tablet, Take 1 tablet (80 mg total) by mouth daily at 8 pm., Disp: 30 tablet, Rfl: 0   losartan (COZAAR) 25 MG tablet, Take 1 tablet (25 mg total) by mouth daily., Disp: 30 tablet, Rfl: 0   LUMIFY 0.025 % SOLN, Place 1 drop into both eyes daily as needed (for irritation)., Disp: , Rfl:    metFORMIN (GLUCOPHAGE) 500 MG tablet, Take 500-1,000 mg by mouth See admin instructions. Take 1,000 mg by mouth in the morning, 500 mg in the evening, and 500 mg at bedtime, Disp: , Rfl:    metoprolol succinate (TOPROL-XL) 25 MG 24 hr tablet, Take 1 tablet (25 mg total) by mouth daily., Disp: 30 tablet, Rfl: 0   ticagrelor (BRILINTA) 90 MG TABS tablet, Take 1 tablet (90 mg total) by mouth 2 (two) times daily., Disp: 60 tablet, Rfl: 0  Facility-Administered Medications Ordered in Other Encounters:    fentaNYL (SUBLIMAZE) injection, , , PRN, Yates Decamp, MD   Heparin (Porcine) in NaCl 1000-0.9 UT/500ML-% SOLN, , , PRN, Yates Decamp, MD, 500 mL at 09/21/20 0649   heparin sodium (porcine) injection, , , PRN, Yates Decamp, MD, 3,000 Units at 09/21/20 0745   iohexol (OMNIPAQUE) 350 MG/ML injection, , , PRN, Yates Decamp, MD, 135 mL at 09/21/20 0805   lidocaine (PF) (XYLOCAINE) 1 % injection, , , PRN, Yates Decamp, MD, 2 mL at 09/21/20 0653   midazolam (VERSED) injection, , , PRN, Yates Decamp, MD   nitroGLYCERIN 1 mg/10 mL (100 mcg/mL) - IR/CATH LAB, , , PRN, Yates Decamp, MD, 200 mcg at 09/21/20 0973   Radial Cocktail/Verapamil only, , , PRN, Yates Decamp, MD, 10 mL at 09/21/20 0654   ticagrelor (BRILINTA) tablet, , , PRN, Yates Decamp, MD, 180 mg at 09/21/20 0726   verapamil (ISOPTIN) injection, , , PRN, Yates Decamp, MD, 2 mg at 09/21/20 0738   Today's Vitals   09/28/20 0512 09/28/20 0513  BP: 101/67   Pulse: (!) 42   Resp: (!) 23   Temp: 98.5 F (36.9 C)   TempSrc: Oral   SpO2: 97%   Weight:  85 kg  Height:  5\' 4"  (1.626 m)  PainSc:  0-No pain   Body mass index is 32.17 kg/m.     CARDIAC STUDIES:  Coronary arteriogram 09/21/2020: LV hemodynamics: 121/18, EDP 21 mmHg.  Ao 126/74, mean 97 mmHg.  No pressure gradient across the aortic valve. LV: EF 30 to 35% with mid to distal anterior, anteroapical, apical and inferior apical akinesis to dyskinesis. LM: Large-caliber vessel.  Smooth and normal. LAD: Large vessel, just immediately after the first septal perforator, the LAD is flush occluded.  Small diagonal arises at this segment.  The LAD otherwise is a large wraparound LAD and supplies large part of the apical and inferoapical region.  There are collaterals to the right coronary artery. CX: Moderate to large vessel, appears smooth and normal. RCA: Severely diseased diffusely, mid segment appears subtotally to totally occluded with TIMI I flow.  Distal bed of the RCA could not be seen due to minimal flow.  There are ipsilateral and contralateral collaterals to the distal right.   Intervention: Successful stenting of the proximal and mid segment of the LAD with a long 3.0 x 24 mm Synergy XD DES following aspiration thrombectomy, stent deployed at a peak pressure of 18 atmospheric pressure, stenosis reduced from 100% to 0% with stepup and stepdown and TIMI  0 to TIMI-3 flow at the end of the procedure. I did attempt to cross the RCA however extremely difficult and appeared  to behave like a CTO, lesion left alone.  Patient was completely asymptomatic throughout the procedure. 135 mill contrast utilized.  EKG 09/28/2020: Afib w/RVR 199 bpm Anterolateral infarct, age indeterminate  Echocardiogram 09/21/2020:  1. Left ventricular ejection fraction, by estimation, is 30 to 35%. The  left ventricle has moderately decreased function. The left ventricle  demonstrates regional wall motion abnormalities (see scoring  diagram/findings for description). Left ventricular   diastolic parameters are consistent with Grade II diastolic dysfunction  (pseudonormalization). Elevated left ventricular end-diastolic pressure.  The E/e' is 22. There is akinesis of the left ventricular, mid-apical  anterior wall, apical segment,  anteroseptal wall, inferoseptal wall and anterolateral wall. There is  akinesis of the left ventricular, apical inferior wall and apical segment.   2. Right ventricular systolic function is normal. The right ventricular  size is normal.   3. The mitral valve is normal in structure. No evidence of mitral valve  regurgitation. No evidence of mitral stenosis.   4. The aortic valve is tricuspid. Aortic valve regurgitation is not  visualized. Mild aortic valve sclerosis is present, with no evidence of  aortic valve stenosis.   5. The inferior vena cava is dilated in size with <50% respiratory  variability, suggesting right atrial pressure of 15 mmHg.    Assessment/Plan   60 y.o. Caucasian female  with hyperlipidemia, type 2 DM, h/o stroke (vertebral artery dissection 2018), STEMI (09/21/2020) treated with PPCI to prox LAD, now admitted with Afib w/RVR  Afib w/RVR: Causer for her lightheadedness  and chest pain. Chest pain now resolved. Remains in RVR 170-190 bpm/ BP 101/67 mmHg. Start Amio load and infusion. If fails amiodarone or if gets hypotensive, will consider cardioversion. I briefly discussed this with  the patient. She is very averse wi\th the  idea of propofol anesthesia. Will follow up. CHA2DS2VASc score 5, annual stroke risk 7.2%. Pending stabilization, will use heparin. Switch to eliquis later.   CAD: PPCI for anterolateral STEMI 09/22/2020. No chest pain. Compliant with DAPT. Given the need for anticoagulation, will switch Brilinta to plavix.   Mixed hyperlipidemia: Continue lipitor 80 mg  Type 2 DM: SSI insulin  H/o COVID: No active symptoms at this time. Positive test likely residual from prior infection (Tested positive May 30th) Patient is unvaccinated. Still, she is out of infectious window at this point. No isolation needed.  Leukocytosis: Persistent since last admission. No fever, other active infectious s/s. Likely residual from recent COVID and MI. If persists, could consider outpatient PCP/hematology f/u  Updated son Ludger NuttingMitchel Gaster over the phone.  CRITICAL CARE Performed by: Truett MainlandManish Mazell Aylesworth   Total critical care time: 35 minutes   Critical care time was exclusive of separately billable procedures and treating other patients.   Critical care was necessary to treat or prevent imminent or life-threatening deterioration.   Critical care was time spent personally by me on the following activities: development of treatment plan with patient and/or surrogate as well as nursing, discussions with consultants, evaluation of patient's response to treatment, examination of patient, obtaining history from patient or surrogate, ordering and performing treatments and interventions, ordering and review of laboratory studies, ordering and review of radiographic studies, pulse oximetry and re-evaluation of patient's condition.     Elder NegusManish J Enoc Getter, MD Pager: [phone] Office: [phone]

## 2020-09-28 NOTE — Progress Notes (Signed)
Patient converted to sinus rhythm on IV amiodarone around 7 AM. Will transition to PO amiodarone alter today. If stays in sinus over next 24 hrs, discharge tomorrow morning.   Elder Negus, MD Pager: [phone] Office: [phone]

## 2020-09-28 NOTE — Progress Notes (Signed)
Patwardhan MD paged with following pt concerns at 1022 09/28/2020: 1) NPO diet 2) wanting to leave hospital today 3) pt CBG 186 4) concern for damage to elbow during cardiac cath.  5) Discontinuation of Covid precautions.   Orders placed:  1) Heart healthy/carb mod diet orders 2) Discontinuation of Covid precautions   MD at bedside 1028 09/28/2020

## 2020-09-28 NOTE — Progress Notes (Signed)
Alexandra Ward called from [facility]. Patient had positive covid test on 09/21/2020. Need provider perspective on if patient should be on precautions or not. [facility]IP advises if no symptoms, please document as such so precautions can be removed.

## 2020-09-28 NOTE — Discharge Instructions (Signed)

## 2020-09-28 NOTE — Progress Notes (Addendum)
ANTICOAGULATION CONSULT NOTE - Initial Consult  Pharmacy Consult for Heparin -> apixaban Indication: atrial fibrillation  Allergies  Allergen Reactions   Hydrocodone Nausea And Vomiting   Other Nausea And Vomiting and Other (See Comments)    States all pain medications make her very ill- vomits   Tyloxapol Nausea And Vomiting   Prednisone Rash and Other (See Comments)    Chest pain and a short-lived rash on the neck    Patient Measurements: Height: 5\' 4"  (162.6 cm) Weight: 85 kg (187 lb 6.3 oz) IBW/kg (Calculated) : 54.7 Heparin Dosing Weight: 73 kg  Vital Signs: Temp: 98.5 F (36.9 C) (06/27 0512) Temp Source: Oral (06/27 0512) BP: 101/67 (06/27 0512) Pulse Rate: 42 (06/27 0512)  Labs: No results for input(s): HGB, HCT, PLT, APTT, LABPROT, INR, HEPARINUNFRC, HEPRLOWMOCWT, CREATININE, CKTOTAL, CKMB, TROPONINIHS in the last 72 hours.  Estimated Creatinine Clearance: 78.9 mL/min (by C-G formula based on SCr of 0.69 mg/dL).   Medical History: Past Medical History:  Diagnosis Date   Diabetes mellitus without complication (HCC)    Hyperlipidemia    Stroke [facility])    Vertebral artery dissection 2018    Medications:  Awaiting electronic med rec  Assessment: 60 y.o. F presents with CP. Pt found to be in afib with RVR. To begin heparin per pharmacy. Likely transition to Eliquis.   Goal of Therapy:  Heparin level 0.3-0.7 units/ml Monitor platelets by anticoagulation protocol: Yes   Plan:  Heparin IV bolus 4000 units Heparin gtt at 1050 units/hr Will f/u heparin level in 6 hours Daily heparin level and CBC  67, PharmD, BCPS Please see amion for complete clinical pharmacist phone list 09/28/2020,5:40 AM  Addendum (0630): MD consulted pharmacy to change heparin to apixaban.  Plan:  D/c heparin Apixaban 5mg  po BID  09/30/2020, PharmD, BCPS Please see amion for complete clinical pharmacist phone list 09/28/2020 6:25 AM

## 2020-09-28 NOTE — TOC Transition Note (Signed)
Transition of Care [facility]) - CM/SW Discharge Note   Patient Details  Name: Janequa Kipnis MRN: [id number] Date of Birth: 06-11-1960  Transition of Care [facility]) CM/SW Contact:  Leone Haven, RN Phone Number: 09/28/2020, 4:17 PM   Clinical Narrative:    NCM called patient's room, her son answered, he states her PCP is  Dr. Sherryll Burger, Secretary will make follow up apt.  Son states he has already paid for her meds and they have them. Patient will be dc in the am, son will transport her home.   Final next level of care: Home/Self Care Barriers to Discharge: Continued Medical Work up   Patient Goals and CMS Choice Patient states their goals for this hospitalization and ongoing recovery are:: return home   Choice offered to / list presented to : NA  Discharge Placement                       Discharge Plan and Services                  DME Agency: NA       HH Arranged: NA          Social Determinants of Health (SDOH) Interventions     Readmission Risk Interventions No flowsheet data found.

## 2020-09-29 ENCOUNTER — Other Ambulatory Visit (HOSPITAL_COMMUNITY): Payer: Self-pay

## 2020-09-29 ENCOUNTER — Telehealth: Payer: Self-pay

## 2020-09-29 LAB — CBC
HCT: 35.4 % — ABNORMAL LOW (ref 36.0–46.0)
Hemoglobin: 11.5 g/dL — ABNORMAL LOW (ref 12.0–15.0)
MCH: 28.8 pg (ref 26.0–34.0)
MCHC: 32.5 g/dL (ref 30.0–36.0)
MCV: 88.7 fL (ref 80.0–100.0)
Platelets: 352 10*3/uL (ref 150–400)
RBC: 3.99 MIL/uL (ref 3.87–5.11)
RDW: 13.2 % (ref 11.5–15.5)
WBC: 16.4 10*3/uL — ABNORMAL HIGH (ref 4.0–10.5)
nRBC: 0 % (ref 0.0–0.2)

## 2020-09-29 LAB — GLUCOSE, CAPILLARY: Glucose-Capillary: 174 mg/dL — ABNORMAL HIGH (ref 70–99)

## 2020-09-29 LAB — BASIC METABOLIC PANEL
Anion gap: 8 (ref 5–15)
BUN: 14 mg/dL (ref 6–20)
CO2: 23 mmol/L (ref 22–32)
Calcium: 8.5 mg/dL — ABNORMAL LOW (ref 8.9–10.3)
Chloride: 107 mmol/L (ref 98–111)
Creatinine, Ser: 0.68 mg/dL (ref 0.44–1.00)
GFR, Estimated: >60 mL/min (ref >60)
Glucose, Bld: 147 mg/dL — ABNORMAL HIGH (ref 70–99)
Potassium: 3.4 mmol/L — ABNORMAL LOW (ref 3.5–5.1)
Sodium: 138 mmol/L (ref 135–145)

## 2020-09-29 MED ORDER — CLOPIDOGREL BISULFATE 300 MG PO TABS: 600.0000 mg | ORAL_TABLET | Freq: Once | ORAL | Status: DC

## 2020-09-29 NOTE — Plan of Care (Signed)

## 2020-09-29 NOTE — Telephone Encounter (Signed)
Location of hospitalization: [place] Reason for hospitalization: A-fib Date of discharge: 09/30/2018 Date of first communication with patient: today Person contacting patient: Marvell Fuller Current symptoms: Patient is feeling good  Do you understand why you were in the Hospital: Yes Questions regarding discharge instructions: None Where were you discharged to: Home Medications reviewed: Yes. Patient's son did have some question regarding the medications patient was discharged with.  Allergies reviewed: Yes Dietary changes reviewed: Yes. No changes were made to patients diet. Referals reviewed: NA Activities of Daily Living: Able to with mild limitations Any transportation issues/concerns: None Any patient concerns: None Confirmed importance & date/time of Follow up appt: Yes Confirmed with patient if condition begins to worsen call. Pt was given the office number and encouraged to call back with questions or concerns: Yes

## 2020-09-29 NOTE — Discharge Summary (Signed)
Physician Discharge Summary  Patient ID: Alexandra Ward MRN: [id number] DOB/AGE: April 28, 1960 60 y.o. Kirstie Peri, MD   Admit date: 09/28/2020 Discharge date: 09/29/2020  Primary Discharge Diagnosis Atrial fibrillation with RVR Coronary artery disease PCI to proximal LAD 09/22/2020 Mixed hyperlipidemia Leucocytosis   Significant Diagnostic Studies:  Coronary arteriogram 09/21/2020: LV hemodynamics: 121/18, EDP 21 mmHg.  Ao 126/74, mean 97 mmHg.  No pressure gradient across the aortic valve. LV: EF 30 to 35% with mid to distal anterior, anteroapical, apical and inferior apical akinesis to dyskinesis. LM: Large-caliber vessel.  Smooth and normal. LAD: Large vessel, just immediately after the first septal perforator, the LAD is flush occluded.  Small diagonal arises at this segment.  The LAD otherwise is a large wraparound LAD and supplies large part of the apical and inferoapical region.  There are collaterals to the right coronary artery. CX: Moderate to large vessel, appears smooth and normal. RCA: Severely diseased diffusely, mid segment appears subtotally to totally occluded with TIMI I flow.  Distal bed of the RCA could not be seen due to minimal flow.  There are ipsilateral and contralateral collaterals to the distal right.   Intervention: Successful stenting of the proximal and mid segment of the LAD with a long 3.0 x 24 mm Synergy XD DES following aspiration thrombectomy, stent deployed at a peak pressure of 18 atmospheric pressure, stenosis reduced from 100% to 0% with stepup and stepdown and TIMI 0 to TIMI-3 flow at the end of the procedure. I did attempt to cross the RCA however extremely difficult and appeared to behave like a CTO, lesion left alone.  Patient was completely asymptomatic throughout the procedure. 135 mill contrast utilized.   EKG 09/28/2020: Afib w/RVR 199 bpm Anterolateral infarct, age indeterminate   Echocardiogram 09/21/2020:  1. Left ventricular ejection  fraction, by estimation, is 30 to 35%. The  left ventricle has moderately decreased function. The left ventricle  demonstrates regional wall motion abnormalities (see scoring  diagram/findings for description). Left ventricular   diastolic parameters are consistent with Grade II diastolic dysfunction  (pseudonormalization). Elevated left ventricular end-diastolic pressure.  The E/e' is 22. There is akinesis of the left ventricular, mid-apical  anterior wall, apical segment,  anteroseptal wall, inferoseptal wall and anterolateral wall. There is  akinesis of the left ventricular, apical inferior wall and apical segment.   2. Right ventricular systolic function is normal. The right ventricular  size is normal.   3. The mitral valve is normal in structure. No evidence of mitral valve  regurgitation. No evidence of mitral stenosis.   4. The aortic valve is tricuspid. Aortic valve regurgitation is not  visualized. Mild aortic valve sclerosis is present, with no evidence of  aortic valve stenosis.   5. The inferior vena cava is dilated in size with <50% respiratory  variability, suggesting right atrial pressure of 15 mmHg.  Radiology:  DG Chest 1 View 09/28/2020 The heart size and mediastinal contours are within normal limits. Both lungs are clear. The visualized skeletal structures are unremarkable.  IMPRESSION: No active disease.   Hospital Course: Alexandra Ward is a 60 y.o. female  patient with hyperlipidemia, type 2 DM, h/o stroke (vertebral artery dissection 2018), STEMI (09/21/2020) treated with PPCI to prox LAD who presented with lightheadedness and concern that her heart rate was elevated 180 bpm.  Upon presentation to the emergency department she was found to be in atrial fibrillation with rapid ventricular response.  Patient was started on amiodarone infusion and converted to sinus rhythm  around 7 AM yesterday.  Patient maintained sinus rhythm and was transitioned to oral amiodarone as  well as started on Eliquis.  Patient's blood pressure remains soft, however she is asymptomatic.  Recommendations on discharge:   Atrial fibrillation with RVR CHA2DS2VASc score 5, annual stroke risk 7.2% Continue Eliquis 5 mg p.o. twice daily Continue amiodarone 200 mg p.o. twice daily Continue metoprolol succinate 25 mg p.o. once daily.  Coronary artery disease PCI to proximal LAD 09/22/2020 Continue dual antiplatelet therapy with aspirin and Plavix Continue atorvastatin, losartan, metoprolol succinate.  Mixed hyperlipidemia Continue atorvastatin 80 mg p.o. once daily  Leucocytosis Recommend outpatient follow up with PCP for further evaluation.  Has been persistent since last admission. Consider residual from previous Covid infection or recent MI. No active infectious signs or symptoms.    Discharge Exam: Vitals with BMI 09/29/2020 09/29/2020 09/29/2020  Height - - -  Weight - - -  BMI - - -  Systolic 98 107 -  Diastolic 66 74 -  Pulse 80 80 72     Physical Exam Vitals and nursing note reviewed.  HENT:     Head: Normocephalic and atraumatic.  Cardiovascular:     Rate and Rhythm: Normal rate and regular rhythm.     Pulses: Intact distal pulses.     Heart sounds: S1 normal and S2 normal. No murmur heard.   No gallop.  Pulmonary:     Effort: Pulmonary effort is normal. No respiratory distress.     Breath sounds: No wheezing, rhonchi or rales.  Abdominal:     General: Bowel sounds are normal. There is no distension.     Palpations: Abdomen is soft.  Musculoskeletal:     Right lower leg: No edema.     Left lower leg: No edema.  Neurological:     Mental Status: She is alert.    Labs:   Lab Results  Component Value Date   WBC 16.4 (H) 09/29/2020   HGB 11.5 (L) 09/29/2020   HCT 35.4 (L) 09/29/2020   MCV 88.7 09/29/2020   PLT 352 09/29/2020    Recent Labs  Lab 09/29/20 0148  NA 138  K 3.4*  CL 107  CO2 23  BUN 14  CREATININE 0.68  CALCIUM 8.5*  GLUCOSE  147*    Lipid Panel     Component Value Date/Time   CHOL 146 09/21/2020 0659   TRIG 104 09/21/2020 0659   HDL 24 (L) 09/21/2020 0659   CHOLHDL 6.1 09/21/2020 0659   VLDL 21 09/21/2020 0659   LDLCALC 101 (H) 09/21/2020 0659    BNP (last 3 results) No results for input(s): BNP in the last 8760 hours.  HEMOGLOBIN A1C Lab Results  Component Value Date   HGBA1C 7.4 (H) 09/21/2020   MPG 166 09/21/2020    Cardiac Panel:  None  TSH No results for input(s): TSH in the last 8760 hours.  FOLLOW UP PLANS AND APPOINTMENTS  Allergies as of 09/29/2020       Reactions   Hydrocodone Nausea And Vomiting   Other Nausea And Vomiting, Other (See Comments)   States all pain medications make her very ill- vomits   Tyloxapol Nausea And Vomiting   Prednisone Rash, Other (See Comments)   Chest pain and a short-lived rash on the neck        Medication List     STOP taking these medications    Brilinta 90 MG Tabs tablet Generic drug: ticagrelor       TAKE  these medications    amiodarone 200 MG tablet Commonly known as: PACERONE Take 1 tablet (200 mg total) by mouth 2 (two) times daily.   Aspirin Low Dose 81 MG chewable tablet Generic drug: aspirin Chew 1 tablet (81 mg total) by mouth daily. What changed: Another medication with the same name was removed. Continue taking this medication, and follow the directions you see here.   atorvastatin 80 MG tablet Commonly known as: LIPITOR Take 1 tablet (80 mg total) by mouth daily at 8 pm.   clopidogrel 75 MG tablet Commonly known as: PLAVIX Take 1 tablet (75 mg total) by mouth daily.   Eliquis 5 MG Tabs tablet Generic drug: apixaban Take 1 tablet (5 mg total) by mouth 2 (two) times daily.   losartan 25 MG tablet Commonly known as: COZAAR Take 1 tablet (25 mg total) by mouth daily.   Lumify 0.025 % Soln Generic drug: Brimonidine Tartrate Place 1 drop into both eyes daily as needed (for irritation).   metFORMIN 500 MG  tablet Commonly known as: GLUCOPHAGE Take 500-1,000 mg by mouth See admin instructions. Take 1,000 mg by mouth in the morning, 500 mg in the evening, and 500 mg at bedtime   metoprolol succinate 25 MG 24 hr tablet Commonly known as: TOPROL-XL Take 1 tablet (25 mg total) by mouth daily.        Follow-up Information     Kirstie Peri, MD Follow up.   Specialty: Internal Medicine Why: 09/30/20 @1PM  Contact information: [address] [place] [phone]                 Patient was seen in collaboration with Dr. [phone]. He also reviewed patient's chart and examined the patient. Dr. Rosemary Holms is in agreement of the plan.    Rosemary Holms, PA-C 09/29/2020, 8:34 AM Office: [phone]

## 2020-09-29 NOTE — Progress Notes (Deleted)
Primary Physician/Referring:  Kirstie Peri, MD  Patient ID: Alexandra Ward, female    DOB: 11-Dec-1960, 60 y.o.   MRN: [id number]  No chief complaint on file.  HPI:    Alexandra Ward  is a 59 y.o.  female  patient with hyperlipidemia, type 2 DM, h/o stroke (vertebral artery dissection 2018), STEMI (09/21/2020) treated with PPCI to prox LAD who presented again 09/28/20 with lightheadedness and concern that her heart rate was elevated 180 bpm, found to be in atrial fibrillation with rapid ventricular response.  Patient converted to normal sinus rhythm with amiodarone drip and was transitioned to oral medications and started on Eliquis.   Patient was discharged yesterday and now presents for follow-up.***  Past Medical History:  Diagnosis Date   Diabetes mellitus without complication (HCC)    Hyperlipidemia    Stroke [facility])    Vertebral artery dissection 2018   Past Surgical History:  Procedure Laterality Date   CORONARY THROMBECTOMY N/A 09/21/2020   Procedure: Coronary Thrombectomy;  Surgeon: Yates Decamp, MD;  Location: [facility] INVASIVE CV LAB;  Service: Cardiovascular;  Laterality: N/A;   CORONARY/GRAFT ACUTE MI REVASCULARIZATION N/A 09/21/2020   Procedure: Coronary/Graft Acute MI Revascularization;  Surgeon: Yates Decamp, MD;  Location: [facility] INVASIVE CV LAB;  Service: Cardiovascular;  Laterality: N/A;   LEFT HEART CATH AND CORONARY ANGIOGRAPHY N/A 09/21/2020   Procedure: LEFT HEART CATH AND CORONARY ANGIOGRAPHY;  Surgeon: Yates Decamp, MD;  Location: [facility] INVASIVE CV LAB;  Service: Cardiovascular;  Laterality: N/A;   No family history on file.  Social History   Tobacco Use   Smoking status: Every Day    Pack years: 0.00    Types: Cigarettes   Smokeless tobacco: Never  Substance Use Topics   Alcohol use: No   Marital Status: Divorced   ROS  ***ROS  Objective  There were no vitals taken for this visit.  Vitals with BMI 09/29/2020 09/29/2020 09/29/2020  Height - - -  Weight - - -  BMI - - -   Systolic 98 107 -  Diastolic 66 74 -  Pulse 80 80 72      ***Physical Exam  Laboratory examination:   Recent Labs    09/22/20 0039 09/28/20 0824 09/29/20 0148  NA 139 140 138  K 3.8 3.6 3.4*  CL 105 109 107  CO2 26 24 23   GLUCOSE 154* 177* 147*  BUN 11 10 14   CREATININE 0.69 0.72 0.68  CALCIUM 8.9 8.5* 8.5*  GFRNONAA >60 >60 >60   estimated creatinine clearance is 78.4 mL/min (by C-G formula based on SCr of 0.68 mg/dL).  CMP Latest Ref Rng & Units 09/29/2020 09/28/2020 09/22/2020  Glucose 70 - 99 mg/dL 09/30/2020) 09/24/2020) 614(E)  BUN 6 - 20 mg/dL 14 10 11   Creatinine 0.44 - 1.00 mg/dL 315(Q 008(Q  Sodium 135 - 145 mmol/L 138 140 139  Potassium 3.5 - 5.1 mmol/L 3.4(L) 3.6 3.8  Chloride 98 - 111 mmol/L 107 109 105  CO2 22 - 32 mmol/L 23 24 26   Calcium 8.9 - 10.3 mg/dL 7.61) 9.50) 8.9  Total Protein 6.5 - 8.1 g/dL - - -  Total Bilirubin 0.3 - 1.2 mg/dL - - -  Alkaline Phos 38 - 126 U/L - - -  AST 15 - 41 U/L - - -  ALT 0 - 44 U/L - - -   CBC Latest Ref Rng & Units 09/29/2020 09/28/2020 09/22/2020  WBC 4.0 - 10.5 K/uL 16.4(H) 19.3(H) 15.2(H)  Hemoglobin 12.0 -  15.0 g/dL 11.5(L) 11.9(L) 11.1(L)  Hematocrit 36.0 - 46.0 % 35.4(L) 36.7 33.7(L)  Platelets 150 - 400 K/uL 352 401(H) 286    Lipid Panel Recent Labs    09/21/20 0659  CHOL 146  TRIG 104  LDLCALC 101*  VLDL 21  HDL 24*  CHOLHDL 6.1    HEMOGLOBIN A1C Lab Results  Component Value Date   HGBA1C 7.4 (H) 09/21/2020   MPG 166 09/21/2020   TSH No results for input(s): TSH in the last 8760 hours.  External labs:   None   Allergies   Allergies  Allergen Reactions   Hydrocodone Nausea And Vomiting   Other Nausea And Vomiting and Other (See Comments)    States all pain medications make her very ill- vomits   Tyloxapol Nausea And Vomiting   Prednisone Rash and Other (See Comments)    Chest pain and a short-lived rash on the neck      Medications Prior to Visit:   Outpatient Medications Prior to  Visit  Medication Sig Dispense Refill   amiodarone (PACERONE) 200 MG tablet Take 1 tablet (200 mg total) by mouth 2 (two) times daily. 60 tablet 1   apixaban (ELIQUIS) 5 MG TABS tablet Take 1 tablet (5 mg total) by mouth 2 (two) times daily. 60 tablet 1   aspirin 81 MG chewable tablet Chew 1 tablet (81 mg total) by mouth daily. 30 tablet 0   atorvastatin (LIPITOR) 80 MG tablet Take 1 tablet (80 mg total) by mouth daily at 8 pm. 30 tablet 0   clopidogrel (PLAVIX) 75 MG tablet Take 1 tablet (75 mg total) by mouth daily. 30 tablet 1   losartan (COZAAR) 25 MG tablet Take 1 tablet (25 mg total) by mouth daily. 30 tablet 0   LUMIFY 0.025 % SOLN Place 1 drop into both eyes daily as needed (for irritation).     metFORMIN (GLUCOPHAGE) 500 MG tablet Take 500-1,000 mg by mouth See admin instructions. Take 1,000 mg by mouth in the morning, 500 mg in the evening, and 500 mg at bedtime     metoprolol succinate (TOPROL-XL) 25 MG 24 hr tablet Take 1 tablet (25 mg total) by mouth daily. 30 tablet 0   Facility-Administered Medications Prior to Visit  Medication Dose Route Frequency Provider Last Rate Last Admin   acetaminophen (TYLENOL) tablet 650 mg  650 mg Oral Q4H PRN Patwardhan, Manish J, MD       amiodarone (PACERONE) tablet 200 mg  200 mg Oral BID Patwardhan, Manish J, MD   200 mg at 09/28/20 1801   apixaban (ELIQUIS) tablet 5 mg  5 mg Oral BID Titus Mould, RPH   5 mg at 09/28/20 2141   aspirin chewable tablet 81 mg  81 mg Oral Daily Patwardhan, Manish J, MD   81 mg at 09/28/20 1151   atorvastatin (LIPITOR) tablet 80 mg  80 mg Oral Q2000 Patwardhan, Manish J, MD   80 mg at 09/28/20 2141   clopidogrel (PLAVIX) tablet 600 mg  600 mg Oral Once Patwardhan, Manish J, MD       fentaNYL (SUBLIMAZE) injection    PRN Yates Decamp, MD       Heparin (Porcine) in NaCl 1000-0.9 UT/500ML-% SOLN    PRN Yates Decamp, MD   500 mL at 09/21/20 0649   heparin sodium (porcine) injection    PRN Yates Decamp, MD   3,000 Units at  09/21/20 0745   insulin aspart (novoLOG) injection 0-5 Units  0-5 Units  Subcutaneous QHS Patwardhan, Manish J, MD       insulin aspart (novoLOG) injection 0-9 Units  0-9 Units Subcutaneous TID WC Patwardhan, Manish J, MD   2 Units at 09/29/20 0649   insulin aspart (novoLOG) injection 3 Units  3 Units Subcutaneous TID WC Patwardhan, Manish J, MD   3 Units at 09/29/20 0649   iohexol (OMNIPAQUE) 350 MG/ML injection    PRN Yates Decamp, MD   135 mL at 09/21/20 0805   lidocaine (PF) (XYLOCAINE) 1 % injection    PRN Yates Decamp, MD   2 mL at 09/21/20 0653   midazolam (VERSED) injection    PRN Yates Decamp, MD       nitroGLYCERIN 1 mg/10 mL (100 mcg/mL) - IR/CATH LAB    PRN Yates Decamp, MD   200 mcg at 09/21/20 0733   ondansetron (ZOFRAN) injection 4 mg  4 mg Intravenous Q6H PRN Patwardhan, Anabel Bene, MD       Radial Cocktail/Verapamil only    PRN Yates Decamp, MD   10 mL at 09/21/20 0654   ticagrelor (BRILINTA) tablet    PRN Yates Decamp, MD   180 mg at 09/21/20 0726   verapamil (ISOPTIN) injection    PRN Yates Decamp, MD   2 mg at 09/21/20 1610     Final Medications at End of Visit    No outpatient medications have been marked as taking for the 09/30/20 encounter (Appointment) with Carlynn Purl, Sakura Denis C, PA-C.     Radiology:   DG Chest 1 View  Result Date: 09/28/2020 CLINICAL DATA:  Leukocytosis. EXAM: CHEST  1 VIEW COMPARISON:  September 21, 2020. FINDINGS: The heart size and mediastinal contours are within normal limits. Both lungs are clear. The visualized skeletal structures are unremarkable. IMPRESSION: No active disease. Electronically Signed   By: Lupita Raider M.D.   On: 09/28/2020 11:18    Cardiac Studies:   Coronary arteriogram 09/21/2020: LV hemodynamics: 121/18, EDP 21 mmHg.  Ao 126/74, mean 97 mmHg.  No pressure gradient across the aortic valve. LV: EF 30 to 35% with mid to distal anterior, anteroapical, apical and inferior apical akinesis to dyskinesis. LM: Large-caliber vessel.  Smooth and  normal. LAD: Large vessel, just immediately after the first septal perforator, the LAD is flush occluded.  Small diagonal arises at this segment.  The LAD otherwise is a large wraparound LAD and supplies large part of the apical and inferoapical region.  There are collaterals to the right coronary artery. CX: Moderate to large vessel, appears smooth and normal. RCA: Severely diseased diffusely, mid segment appears subtotally to totally occluded with TIMI I flow.  Distal bed of the RCA could not be seen due to minimal flow.  There are ipsilateral and contralateral collaterals to the distal right.   Intervention: Successful stenting of the proximal and mid segment of the LAD with a long 3.0 x 24 mm Synergy XD DES following aspiration thrombectomy, stent deployed at a peak pressure of 18 atmospheric pressure, stenosis reduced from 100% to 0% with stepup and stepdown and TIMI 0 to TIMI-3 flow at the end of the procedure. I did attempt to cross the RCA however extremely difficult and appeared to behave like a CTO, lesion left alone.  Patient was completely asymptomatic throughout the procedure. 135 mill contrast utilized.    Echocardiogram 09/21/2020:  1. Left ventricular ejection fraction, by estimation, is 30 to 35%. The  left ventricle has moderately decreased function. The left ventricle  demonstrates regional wall  motion abnormalities (see scoring  diagram/findings for description). Left ventricular   diastolic parameters are consistent with Grade II diastolic dysfunction  (pseudonormalization). Elevated left ventricular end-diastolic pressure.  The E/e' is 22. There is akinesis of the left ventricular, mid-apical  anterior wall, apical segment,  anteroseptal wall, inferoseptal wall and anterolateral wall. There is  akinesis of the left ventricular, apical inferior wall and apical segment.   2. Right ventricular systolic function is normal. The right ventricular  size is normal.   3. The mitral  valve is normal in structure. No evidence of mitral valve  regurgitation. No evidence of mitral stenosis.   4. The aortic valve is tricuspid. Aortic valve regurgitation is not  visualized. Mild aortic valve sclerosis is present, with no evidence of  aortic valve stenosis.   5. The inferior vena cava is dilated in size with <50% respiratory  variability, suggesting right atrial pressure of 15 mmHg.  EKG:   ***  09/28/2020: Afib w/RVR 199 bpm Anterolateral infarct, age indeterminate  09/21/2020: Normal sinus rhythm, normal axis, diffuse anterolateral and inferior STEMI.  Low-voltage complexes.  Assessment  No diagnosis found.   There are no discontinued medications.  No orders of the defined types were placed in this encounter.   Recommendations:   Alexandra Ward is a 60 y.o. ***  ***  Patient was seen in collaboration with Dr. Marland Kitchen***. He also reviewed patient's chart and examined the patient. Dr. Marland Kitchen*** is in agreement of the plan.   During this visit I reviewed and updated: Tobacco history  allergies medication reconciliation  medical history  surgical history  family history  social history.  This note was created using a voice recognition software as a result there may be grammatical errors inadvertently enclosed that do not reflect the nature of this encounter. Every attempt is made to correct such errors.   Rayford HalstedCeleste C Roxane Puerto, PA-C 09/29/2020, 2:55 PM Office: [phone]

## 2020-09-30 ENCOUNTER — Emergency Department (HOSPITAL_COMMUNITY)
Admission: EM | Admit: 2020-09-30 | Discharge: 2020-09-30 | Disposition: A | Discharge status: Home / Self Care | Payer: Self-pay | Attending: Emergency Medicine | Admitting: Emergency Medicine

## 2020-09-30 ENCOUNTER — Other Ambulatory Visit: Payer: Self-pay

## 2020-09-30 ENCOUNTER — Ambulatory Visit: Payer: Self-pay | Admitting: Student

## 2020-09-30 DIAGNOSIS — Z8673 Personal history of transient ischemic attack (TIA), and cerebral infarction without residual deficits: Secondary | ICD-10-CM | POA: Insufficient documentation

## 2020-09-30 DIAGNOSIS — Z955 Presence of coronary angioplasty implant and graft: Secondary | ICD-10-CM | POA: Insufficient documentation

## 2020-09-30 DIAGNOSIS — I2510 Atherosclerotic heart disease of native coronary artery without angina pectoris: Secondary | ICD-10-CM | POA: Insufficient documentation

## 2020-09-30 DIAGNOSIS — E119 Type 2 diabetes mellitus without complications: Secondary | ICD-10-CM | POA: Insufficient documentation

## 2020-09-30 DIAGNOSIS — I480 Paroxysmal atrial fibrillation: Secondary | ICD-10-CM | POA: Insufficient documentation

## 2020-09-30 LAB — CBC WITH DIFFERENTIAL/PLATELET
Abs Immature Granulocytes: 0.07 10*3/uL (ref 0.00–0.07)
Basophils Absolute: 0.1 10*3/uL (ref 0.0–0.1)
Basophils Relative: 1 %
Eosinophils Absolute: 0.1 10*3/uL (ref 0.0–0.5)
Eosinophils Relative: 1 %
HCT: 36.4 % (ref 36.0–46.0)
Hemoglobin: 11.9 g/dL — ABNORMAL LOW (ref 12.0–15.0)
Immature Granulocytes: 0 %
Lymphocytes Relative: 16 %
Lymphs Abs: 2.8 10*3/uL (ref 0.7–4.0)
MCH: 30.1 pg (ref 26.0–34.0)
MCHC: 32.7 g/dL (ref 30.0–36.0)
MCV: 92.2 fL (ref 80.0–100.0)
Monocytes Absolute: 1.1 10*3/uL — ABNORMAL HIGH (ref 0.1–1.0)
Monocytes Relative: 6 %
Neutro Abs: 12.8 10*3/uL — ABNORMAL HIGH (ref 1.7–7.7)
Neutrophils Relative %: 76 %
Platelets: 379 10*3/uL (ref 150–400)
RBC: 3.95 MIL/uL (ref 3.87–5.11)
RDW: 13.1 % (ref 11.5–15.5)
WBC: 16.9 10*3/uL — ABNORMAL HIGH (ref 4.0–10.5)
nRBC: 0 % (ref 0.0–0.2)

## 2020-09-30 LAB — BASIC METABOLIC PANEL
Anion gap: 7 (ref 5–15)
BUN: 13 mg/dL (ref 6–20)
CO2: 22 mmol/L (ref 22–32)
Calcium: 8.5 mg/dL — ABNORMAL LOW (ref 8.9–10.3)
Chloride: 111 mmol/L (ref 98–111)
Creatinine, Ser: 0.72 mg/dL (ref 0.44–1.00)
GFR, Estimated: >60 mL/min (ref >60)
Glucose, Bld: 183 mg/dL — ABNORMAL HIGH (ref 70–99)
Potassium: 3.5 mmol/L (ref 3.5–5.1)
Sodium: 140 mmol/L (ref 135–145)

## 2020-09-30 LAB — MAGNESIUM: Magnesium: 2 mg/dL (ref 1.7–2.4)

## 2020-09-30 MED ORDER — POTASSIUM CHLORIDE CRYS ER 20 MEQ PO TBCR
40.0000 meq | EXTENDED_RELEASE_TABLET | Freq: Once | ORAL | Status: DC
  Filled 2020-09-30: qty 2

## 2020-09-30 MED ORDER — AMIODARONE HCL 200 MG PO TABS: 200.0000 mg | ORAL_TABLET | Freq: Every day | ORAL | Status: DC

## 2020-09-30 NOTE — ED Provider Notes (Signed)
[facility] EMERGENCY DEPARTMENT Provider Note   CSN: [id number] Arrival date & time: 09/30/20  0115     History Chief Complaint  Patient presents with   Atrial Fibrillation RVR     Alexandra Ward is a 60 y.o. female.  The history is provided by the patient and the EMS personnel.  She has history of diabetes, hyperlipidemia, stroke, coronary artery disease status post stent placement, atrial fibrillation was discharged from the hospital yesterday.  At about 10 PM, she noted that her heart was racing and fluttering and she felt a little lightheaded.  She denies chest pain, heaviness, tightness, pressure.  She denies dyspnea.  She did take her evening dose of amiodarone.  EMS noted atrial fibrillation with rapid ventricular response.  Her blood pressure was low and they did give her some IV fluid.  She denies nausea, vomiting, diaphoresis.   No past medical history on file.  There are no problems to display for this patient.   ** The histories are not reviewed yet. Please review them in the "History" navigator section and refresh this SmartLink.   OB History   No obstetric history on file.     No family history on file.     Home Medications Prior to Admission medications   Not on File    Allergies    Hydrocodone  Review of Systems   Review of Systems  All other systems reviewed and are negative.  Physical Exam Updated Vital Signs BP 97/61 (BP Location: Right Arm)   Pulse 72   Temp 98.3 F (36.8 C) (Oral)   Resp 19   Ht 5\' 4"  (1.626 m)   Wt 81.6 kg   SpO2 97%   BMI 30.90 kg/m   Physical Exam Vitals and nursing note reviewed.  60 year old female, resting comfortably and in no acute distress. Vital signs are significant for low blood pressure. Oxygen saturation is 97%, which is normal. Head is normocephalic and atraumatic. PERRLA, EOMI. Oropharynx is clear. Neck is nontender and supple without adenopathy or JVD. Back is nontender and there  is no CVA tenderness. Lungs are clear without rales, wheezes, or rhonchi. Chest is nontender. Heart has regular rate and rhythm without murmur. Abdomen is soft, flat, nontender without masses or hepatosplenomegaly and peristalsis is normoactive. Extremities have no cyanosis or edema, full range of motion is present.  Ecchymosis noted on right forearm. Skin is warm and dry without rash. Neurologic: Mental status is normal, cranial nerves are intact, there are no motor or sensory deficits.  ED Results / Procedures / Treatments   Labs (all labs ordered are listed, but only abnormal results are displayed) Labs Reviewed  BASIC METABOLIC PANEL - Abnormal; Notable for the following components:      Result Value   Glucose, Bld 183 (*)    Calcium 8.5 (*)    All other components within normal limits  CBC WITH DIFFERENTIAL/PLATELET - Abnormal; Notable for the following components:   WBC 16.9 (*)    Hemoglobin 11.9 (*)    Neutro Abs 12.8 (*)    Monocytes Absolute 1.1 (*)    All other components within normal limits  MAGNESIUM   EKG EKG Interpretation  Date/Time:  Wednesday September 30 2020 01:22:20 EDT Ventricular Rate:  71 PR Interval:  157 QRS Duration: 102 QT Interval:  462 QTC Calculation: 503 R Axis:   129 Text Interpretation: Sinus rhythm Probable anterolateral infarct, age indeterm When compared with ECG of 09/28/2020, Sinus rhythm  has replaced Atrial fibrillation with rapid ventricular response Confirmed by Dione Booze ([id number]) on 09/30/2020 1:42:00 AM  Procedures Procedures   Medications Ordered in ED Medications  potassium chloride SA (KLOR-CON) CR tablet 40 mEq (has no administration in time range)  amiodarone (PACERONE) tablet 200 mg (has no administration in time range)    ED Course  I have reviewed the triage vital signs and the nursing notes.  Pertinent lab results that were available during my care of the patient were reviewed by me and considered in my medical  decision making (see chart for details).   MDM Rules/Calculators/A&P                         Probable episode of atrial fibrillation with rapid ventricular response, has spontaneously converted to sinus rhythm.  Old records are reviewed (different [facility] - records are in mrn [id number]) confirming recent hospitalizations for coronary stent placement in [facility], atrial fibrillation with rapid ventricular response.  Blood pressures had been running low during recent hospitalizations.  ECG here shows sinus rhythm without any new ST or T changes.  We will keep on cardiac monitor and check electrolytes and magnesium.  Electrolytes show borderline potassium of 3.5 and she is given additional potassium.  Magnesium is normal.  She has stayed in sinus rhythm while in the emergency department.  Case was discussed with Dr. Rosemary Holms, on-call for [facility] cardiology, who feels patient is safe to go home but recommends giving her an additional dose of amiodarone 200 mg.  This is done.  She has an appointment to see one of the cardiologist later today and she is to keep that appointment.  Final Clinical Impression(s) / ED Diagnoses Final diagnoses:  Paroxysmal atrial fibrillation with rapid ventricular response [facility])    Rx / DC Orders ED Discharge Orders     None        Dione Booze, MD 09/30/20 [phone]

## 2020-09-30 NOTE — Discharge Instructions (Addendum)
See your cardiologist today, as scheduled. °

## 2020-09-30 NOTE — ED Triage Notes (Addendum)
Pt BIB by EMS due being in afib RVR 120-150s. Pt took PO amio around 21:45 because pt was feeling like her heart was racing and lightheadedness. Pt recently had stent placed on June 20th. Pt denies SOB or CP.

## 2020-10-01 NOTE — Progress Notes (Signed)
Primary Physician/Referring:  Monico Blitz, MD  Patient ID: Alexandra Ward, female    DOB: 04/20/1960, 60 y.o.   MRN: [id number]  Chief Complaint  Patient presents with   Hospitalization Follow-up    Stent placement   Code STEMI   HPI:    Alexandra Ward  is a 60 y.o.  female  patient with hyperlipidemia, type 2 DM, h/o stroke (vertebral artery dissection 2018), STEMI (09/21/2020) treated with PCI to prox LAD who presented again 09/28/20 with lightheadedness and concern that her heart rate was elevated 180 bpm, found to be in atrial fibrillation with rapid ventricular response. Echocardiogram revealed moderately reduced LVEF at 30-35% and grade 2 diastolic dysfunction with left ventricular regional wall motion abnormalities. Patient converted to normal sinus rhythm with amiodarone drip and was transitioned to oral medications and started on Eliquis. Patient was discharged 09/29/2020 and presented to the ED again 09/30/2020 following and episode of A fib with RVR per EMS EKG. Patient had spontaneously converted to sinus rhythm prior to arrival in the ED. She was given additional dose of amiodarone 200 mg and discharged home.   Patient was discharged yesterday from ED and now presents for follow-up.  Patient reports that since discharge she has been experiencing more fatigue as well as occasional dyspnea on exertion.  However she does states she was able to walk around [place] without issue, however going up the stairs caused her shortness of breath.  Patient denies chest pain, dizziness, syncope, near syncope.  For the last 2 days since evaluation in the emergency department patient has been taking amiodarone 40 mg twice daily.  Patient is accompanied by her partner at today's visit who states that she does indeed snore at night, patient has never had sleep evaluation.  Unfortunately patient continues to smoke 7-8 cigarettes/day, however she appears highly motivated to quit as she recently purchased  nicotine replacement therapy to assist her in this effort.  Patient is tolerating dual antiplatelet therapy as well as anticoagulation with Eliquis without bleeding diathesis.  Past Medical History:  Diagnosis Date   Diabetes mellitus without complication ([place])    Hyperlipidemia    Stroke [facility])    Vertebral artery dissection 2018   Past Surgical History:  Procedure Laterality Date   CORONARY THROMBECTOMY N/A 09/21/2020   Procedure: Coronary Thrombectomy;  Surgeon: Adrian Prows, MD;  Location: [place] [facility];  Service: Cardiovascular;  Laterality: N/A;   CORONARY/GRAFT ACUTE MI REVASCULARIZATION N/A 09/21/2020   Procedure: Coronary/Graft Acute MI Revascularization;  Surgeon: Adrian Prows, MD;  Location: [place] [facility];  Service: Cardiovascular;  Laterality: N/A;   LEFT HEART CATH AND CORONARY ANGIOGRAPHY N/A 09/21/2020   Procedure: LEFT HEART CATH AND CORONARY ANGIOGRAPHY;  Surgeon: Adrian Prows, MD;  Location: [place] [facility];  Service: Cardiovascular;  Laterality: N/A;   History reviewed. No pertinent family history.  Social History   Tobacco Use   Smoking status: Every Day    Pack years: 0.00    Types: Cigarettes   Smokeless tobacco: Never  Substance Use Topics   Alcohol use: No   Marital Status: Divorced   ROS  Review of Systems  Constitutional: Positive for malaise/fatigue. Negative for weight gain.  Cardiovascular:  Positive for dyspnea on exertion. Negative for chest pain, claudication, leg swelling, near-syncope, orthopnea, palpitations, paroxysmal nocturnal dyspnea and syncope.  Neurological:  Negative for dizziness.   Objective  Blood pressure (!) 98/56, pulse 70, temperature 97.9 F (36.6 C), temperature source Temporal, resp. rate  16, height '5\' 4"'  (1.626 m), weight 184 lb 12.8 oz (83.8 kg), SpO2 97 %.  Vitals with BMI 10/02/2020 09/30/2020 09/30/2020  Height '5\' 4"'  - -  Weight 184 lbs 13 oz - -  BMI 97.58 - -  Systolic 98 832 99  Diastolic 56 67 68  Pulse 70 65  73      Physical Exam Vitals reviewed.  HENT:     Head: Normocephalic and atraumatic.  Neck:     Vascular: No carotid bruit.  Cardiovascular:     Rate and Rhythm: Normal rate and regular rhythm.     Pulses: Intact distal pulses.          Carotid pulses are 2+ on the right side and 2+ on the left side.      Radial pulses are 2+ on the right side and 2+ on the left side.       Popliteal pulses are 2+ on the right side and 2+ on the left side.       Dorsalis pedis pulses are 1+ on the right side and 0 on the left side.       Posterior tibial pulses are 1+ on the right side and 1+ on the left side.     Heart sounds: S1 normal and S2 normal. No murmur heard.   No gallop.  Pulmonary:     Effort: Pulmonary effort is normal. No respiratory distress.     Breath sounds: No wheezing, rhonchi or rales.  Abdominal:     General: Bowel sounds are normal.  Musculoskeletal:     Right lower leg: No edema.     Left lower leg: No edema.  Neurological:     Mental Status: She is alert.    Laboratory examination:   Recent Labs    09/28/20 0824 09/29/20 0148 09/30/20 0126  NA 140 138 140  K 3.6 3.4* 3.5  CL 109 107 111  CO2 '24 23 22  ' GLUCOSE 177* 147* 183*  BUN '10 14 13  ' CREATININE 0.72 0.68 0.72  CALCIUM 8.5* 8.5* 8.5*  GFRNONAA >60 >60 >60   estimated creatinine clearance is 78.3 mL/min (by C-G formula based on SCr of 0.72 mg/dL).  CMP Latest Ref Rng & Units 09/30/2020 09/29/2020 09/28/2020  Glucose 70 - 99 mg/dL 183(H) 147(H) 177(H)  BUN 6 - 20 mg/dL '13 14 10  ' Creatinine 0.44 - 1.00 mg/dL 0.72 0.68 0.72  Sodium 135 - 145 mmol/L 140 138 140  Potassium 3.5 - 5.1 mmol/L 3.5 3.4(L) 3.6  Chloride 98 - 111 mmol/L 111 107 109  CO2 22 - 32 mmol/L '22 23 24  ' Calcium 8.9 - 10.3 mg/dL 8.5(L) 8.5(L) 8.5(L)  Total Protein 6.5 - 8.1 g/dL - - -  Total Bilirubin 0.3 - 1.2 mg/dL - - -  Alkaline Phos 38 - 126 U/L - - -  AST 15 - 41 U/L - - -  ALT 0 - 44 U/L - - -   CBC Latest Ref Rng & Units  09/30/2020 09/29/2020 09/28/2020  WBC 4.0 - 10.5 K/uL 16.9(H) 16.4(H) 19.3(H)  Hemoglobin 12.0 - 15.0 g/dL 11.9(L) 11.5(L) 11.9(L)  Hematocrit 36.0 - 46.0 % 36.4 35.4(L) 36.7  Platelets 150 - 400 K/uL 379 352 401(H)    Lipid Panel Recent Labs    09/21/20 0659  CHOL 146  TRIG 104  LDLCALC 101*  VLDL 21  HDL 24*  CHOLHDL 6.1    HEMOGLOBIN A1C Lab Results  Component Value Date   HGBA1C  7.4 (H) 09/21/2020   MPG 166 09/21/2020   TSH No results for input(s): TSH in the last 8760 hours.  External labs:   None   Allergies   Allergies  Allergen Reactions   Hydrocodone Nausea And Vomiting   Hydrocodone Nausea And Vomiting   Other Nausea And Vomiting and Other (See Comments)    States all pain medications make her very ill- vomits   Oxycodone-Acetaminophen Nausea And Vomiting   Tyloxapol Nausea And Vomiting   Prednisone Rash and Other (See Comments)    Chest pain and a short-lived rash on the neck      Medications Prior to Visit:   Outpatient Medications Prior to Visit  Medication Sig Dispense Refill   Artificial Tear Ointment (DRY EYES OP) Apply 1 drop to eye daily as needed (dry eyes).     aspirin 81 MG chewable tablet Chew 1 tablet (81 mg total) by mouth daily. 30 tablet 0   LUMIFY 0.025 % SOLN Place 1 drop into both eyes daily as needed (for irritation).     metFORMIN (GLUCOPHAGE) 500 MG tablet Take 500-1,000 mg by mouth See admin instructions. 1000 mg in the morning 500 mg in the evening     amiodarone (PACERONE) 200 MG tablet Take 1 tablet (200 mg total) by mouth 2 (two) times daily. 60 tablet 1   apixaban (ELIQUIS) 5 MG TABS tablet Take 1 tablet (5 mg total) by mouth 2 (two) times daily. 60 tablet 1   atorvastatin (LIPITOR) 80 MG tablet Take 1 tablet (80 mg total) by mouth daily at 8 pm. 30 tablet 0   clopidogrel (PLAVIX) 75 MG tablet Take 1 tablet (75 mg total) by mouth daily. 30 tablet 1   losartan (COZAAR) 25 MG tablet Take 1 tablet (25 mg total) by mouth  daily. 30 tablet 0   metoprolol succinate (TOPROL-XL) 25 MG 24 hr tablet Take 1 tablet (25 mg total) by mouth daily. 30 tablet 0   amiodarone (PACERONE) 200 MG tablet Take 200 mg by mouth 2 (two) times daily.     apixaban (ELIQUIS) 5 MG TABS tablet Take 5 mg by mouth 2 (two) times daily.     aspirin EC 81 MG tablet Take 81 mg by mouth daily. Swallow whole.     atorvastatin (LIPITOR) 80 MG tablet Take 80 mg by mouth at bedtime.     clopidogrel (PLAVIX) 75 MG tablet Take 75 mg by mouth daily.     ibuprofen (ADVIL) 200 MG tablet Take 400 mg by mouth every 6 (six) hours as needed for headache or moderate pain. (Patient not taking: Reported on 10/02/2020)     losartan (COZAAR) 25 MG tablet Take 25 mg by mouth daily.     metFORMIN (GLUCOPHAGE) 500 MG tablet Take 500-1,000 mg by mouth See admin instructions. Take 1,000 mg by mouth in the morning, 500 mg in the evening, and 500 mg at bedtime     metoprolol succinate (TOPROL-XL) 25 MG 24 hr tablet Take 25 mg by mouth daily.     predniSONE (STERAPRED UNI-PAK 21 TAB) 5 MG (21) TBPK tablet Take 5 mg by mouth See admin instructions. 6,5,4,3,2,1 (Patient not taking: Reported on 09/30/2020)     fentaNYL (SUBLIMAZE) injection      Heparin (Porcine) in NaCl 1000-0.9 UT/500ML-% SOLN      heparin sodium (porcine) injection      iohexol (OMNIPAQUE) 350 MG/ML injection      lidocaine (PF) (XYLOCAINE) 1 % injection  midazolam (VERSED) injection      nitroGLYCERIN 1 mg/10 mL (100 mcg/mL) - IR/CATH LAB      Radial Cocktail/Verapamil only      ticagrelor (BRILINTA) tablet      verapamil (ISOPTIN) injection      No facility-administered medications prior to visit.     Final Medications at End of Visit    Current Meds  Medication Sig   Artificial Tear Ointment (DRY EYES OP) Apply 1 drop to eye daily as needed (dry eyes).   aspirin 81 MG chewable tablet Chew 1 tablet (81 mg total) by mouth daily.   LUMIFY 0.025 % SOLN Place 1 drop into both eyes daily as  needed (for irritation).   metFORMIN (GLUCOPHAGE) 500 MG tablet Take 500-1,000 mg by mouth See admin instructions. 1000 mg in the morning 500 mg in the evening   [DISCONTINUED] amiodarone (PACERONE) 200 MG tablet Take 1 tablet (200 mg total) by mouth 2 (two) times daily.   [DISCONTINUED] apixaban (ELIQUIS) 5 MG TABS tablet Take 1 tablet (5 mg total) by mouth 2 (two) times daily.   [DISCONTINUED] atorvastatin (LIPITOR) 80 MG tablet Take 1 tablet (80 mg total) by mouth daily at 8 pm.   [DISCONTINUED] clopidogrel (PLAVIX) 75 MG tablet Take 1 tablet (75 mg total) by mouth daily.   [DISCONTINUED] losartan (COZAAR) 25 MG tablet Take 1 tablet (25 mg total) by mouth daily.   [DISCONTINUED] metoprolol succinate (TOPROL-XL) 25 MG 24 hr tablet Take 1 tablet (25 mg total) by mouth daily.   Radiology:   No results found.  Cardiac Studies:   Coronary arteriogram 09/21/2020: LV hemodynamics: 121/18, EDP 21 mmHg.  Ao 126/74, mean 97 mmHg.  No pressure gradient across the aortic valve. LV: EF 30 to 35% with mid to distal anterior, anteroapical, apical and inferior apical akinesis to dyskinesis. LM: Large-caliber vessel.  Smooth and normal. LAD: Large vessel, just immediately after the first septal perforator, the LAD is flush occluded.  Small diagonal arises at this segment.  The LAD otherwise is a large wraparound LAD and supplies large part of the apical and inferoapical region.  There are collaterals to the right coronary artery. CX: Moderate to large vessel, appears smooth and normal. RCA: Severely diseased diffusely, mid segment appears subtotally to totally occluded with TIMI I flow.  Distal bed of the RCA could not be seen due to minimal flow.  There are ipsilateral and contralateral collaterals to the distal right.   Intervention: Successful stenting of the proximal and mid segment of the LAD with a long 3.0 x 24 mm Synergy XD DES following aspiration thrombectomy, stent deployed at a peak pressure of  18 atmospheric pressure, stenosis reduced from 100% to 0% with stepup and stepdown and TIMI 0 to TIMI-3 flow at the end of the procedure. I did attempt to cross the RCA however extremely difficult and appeared to behave like a CTO, lesion left alone.  Patient was completely asymptomatic throughout the procedure. 135 mill contrast utilized.    Echocardiogram 09/21/2020:  1. Left ventricular ejection fraction, by estimation, is 30 to 35%. The  left ventricle has moderately decreased function. The left ventricle  demonstrates regional wall motion abnormalities (see scoring  diagram/findings for description). Left ventricular   diastolic parameters are consistent with Grade II diastolic dysfunction  (pseudonormalization). Elevated left ventricular end-diastolic pressure.  The E/e' is 79. There is akinesis of the left ventricular, mid-apical  anterior wall, apical segment,  anteroseptal wall, inferoseptal wall and anterolateral wall.  There is  akinesis of the left ventricular, apical inferior wall and apical segment.   2. Right ventricular systolic function is normal. The right ventricular  size is normal.   3. The mitral valve is normal in structure. No evidence of mitral valve  regurgitation. No evidence of mitral stenosis.   4. The aortic valve is tricuspid. Aortic valve regurgitation is not  visualized. Mild aortic valve sclerosis is present, with no evidence of  aortic valve stenosis.   5. The inferior vena cava is dilated in size with <50% respiratory  variability, suggesting right atrial pressure of 15 mmHg.  EKG:   10/02/2020: Sinus rhythm at a rate of 65 bpm.  Right axis Anterolateral infarct, age indeterminate.  Low voltage complexes, consider pulmonary disease pattern.  09/28/2020: Afib w/RVR 199 bpm Anterolateral infarct, age indeterminate  09/21/2020: Normal sinus rhythm, normal axis, diffuse anterolateral and inferior STEMI.  Low-voltage complexes.  Assessment     ICD-10-CM    1. Paroxysmal atrial fibrillation (HCC)  I48.0 CMP14+EGFR    TSH    Ambulatory referral to Sleep Studies    2. Coronary artery disease involving native coronary artery of native heart without angina pectoris  I25.10 CMP14+EGFR    TSH    3. History of ST elevation myocardial infarction (STEMI)- s/p stenting to prox LAD  I25.2 EKG 12-Lead    4. Hypercholesteremia  E78.00     5. Type 2 diabetes mellitus without complication, without long-term current use of insulin (HCC)  E11.9     6. Snoring  R06.83 Ambulatory referral to Sleep Studies    7. Acute combined systolic and diastolic heart failure (HCC)  I50.41        Medications Discontinued During This Encounter  Medication Reason   amiodarone (PACERONE) 200 MG tablet Error   apixaban (ELIQUIS) 5 MG TABS tablet Error   aspirin EC 81 MG tablet Error   atorvastatin (LIPITOR) 80 MG tablet Error   clopidogrel (PLAVIX) 75 MG tablet Error   losartan (COZAAR) 25 MG tablet Error   metFORMIN (GLUCOPHAGE) 500 MG tablet Error   metoprolol succinate (TOPROL-XL) 25 MG 24 hr tablet Error   predniSONE (STERAPRED UNI-PAK 21 TAB) 5 MG (21) TBPK tablet Error   fentaNYL (SUBLIMAZE) injection    Heparin (Porcine) in NaCl 1000-0.9 UT/500ML-% SOLN    heparin sodium (porcine) injection    iohexol (OMNIPAQUE) 350 MG/ML injection    lidocaine (PF) (XYLOCAINE) 1 % injection    midazolam (VERSED) injection    nitroGLYCERIN 1 mg/10 mL (100 mcg/mL) - IR/CATH LAB    Radial Cocktail/Verapamil only    ticagrelor (BRILINTA) tablet    verapamil (ISOPTIN) injection    ibuprofen (ADVIL) 200 MG tablet Discontinued by provider   atorvastatin (LIPITOR) 80 MG tablet Reorder   losartan (COZAAR) 25 MG tablet    metoprolol succinate (TOPROL-XL) 25 MG 24 hr tablet Reorder   apixaban (ELIQUIS) 5 MG TABS tablet Reorder   clopidogrel (PLAVIX) 75 MG tablet Reorder   amiodarone (PACERONE) 200 MG tablet Reorder    Meds ordered this encounter  Medications   losartan  (COZAAR) 25 MG tablet    Sig: Take 0.5 tablets (12.5 mg total) by mouth daily.    Dispense:  15 tablet    Refill:  3   amiodarone (PACERONE) 200 MG tablet    Sig: 200 mg twice daily for 1 week. Then 200 mg once daily.    Dispense:  45 tablet    Refill:  3  apixaban (ELIQUIS) 5 MG TABS tablet    Sig: Take 1 tablet (5 mg total) by mouth 2 (two) times daily.    Dispense:  60 tablet    Refill:  3   atorvastatin (LIPITOR) 80 MG tablet    Sig: Take 1 tablet (80 mg total) by mouth daily at 8 pm.    Dispense:  30 tablet    Refill:  3   clopidogrel (PLAVIX) 75 MG tablet    Sig: Take 1 tablet (75 mg total) by mouth daily.    Dispense:  30 tablet    Refill:  3   metoprolol succinate (TOPROL-XL) 25 MG 24 hr tablet    Sig: Take 1 tablet (25 mg total) by mouth daily.    Dispense:  30 tablet    Refill:  3   This patients CHA2DS2-VASc Score 4 (MI, F, stroke)and yearly risk of stroke 4.8%.   Recommendations:   Alexandra Ward is a 60 y.o. female  patient with hyperlipidemia, type 2 DM, h/o stroke (vertebral artery dissection 2018), STEMI (09/21/2020) treated with PCI to prox LAD who presented again 09/28/20 with lightheadedness and concern that her heart rate was elevated 180 bpm, found to be in atrial fibrillation with rapid ventricular response.  Echocardiogram revealed moderately reduced LVEF at 30-35% and grade 2 diastolic dysfunction with left ventricular regional wall motion abnormalities.  Patient's blood pressure is soft in the office today and she has been experiencing fatigue and dyspnea on exertion, we will therefore reduce losartan from 25 mg to 12.5 mg once daily.  Patient is also mistakenly been taking amiodarone 400 mg twice daily, will reduce this to 200 mg twice daily for 1 week and then further reduce it to 200 mg once daily.  Repeat CMP and TSH.  Patient is presently maintaining sinus rhythm and is tolerating anticoagulation without bleeding diathesis.  We will continue other  guideline directed medical therapy for CAD as well as treatment ofAtrial fibrillation including Eliquis, atorvastatin, clopidogrel, aspirin metoprolol.    As patient is presently being treated with amiodarone, they will need annual monitoring of PFTs, thyroid function, liver function, and ophthalmologic exam. Patient is aware of these monitoring parameters and agrees.   Counseled patient regarding pathophysiology of atrial fibrillation as well as CAD.  Discussed risk factors, symptoms, signs and symptoms that would warrant urgent or emergent evaluation.  Patient does admit to snoring, will therefore obtain sleep evaluation for underlying sleep apnea given atrial fibrillation.  Also discussed at length with patient regarding tobacco cessation.  Spent 6 minutes of today's visit counseling patient for tobacco cessation and reduction of cardiovascular risk.  I have refilled her cardiovascular medications at this time.  Patient expresses increased symptoms of anxiety following recent hospitalization.  She is scheduled to follow-up with PCP in 4 days, advised her to discuss treatment of anxiety with PCP.  Also counseled patient that cardiac rehab will likely help improve her feelings of health-related anxiety as well.  Given patient's soft blood pressure will hold off on initiating cardiac rehab at this time, will reevaluate readiness for cardiac rehab at next office visit.  Will continue to follow patient closely, follow-up in 2 weeks, sooner if needed for CAD and A. fib.    Alethia Berthold, PA-C 10/02/2020, 12:48 PM Office: [phone]

## 2020-10-02 ENCOUNTER — Other Ambulatory Visit: Payer: Self-pay

## 2020-10-02 ENCOUNTER — Ambulatory Visit: Payer: Self-pay | Admitting: Student

## 2020-10-02 ENCOUNTER — Encounter: Payer: Self-pay | Admitting: Student

## 2020-10-02 VITALS — BP 98/56 | HR 70 | Temp 97.9°F | Resp 16 | Ht 64.0 in | Wt 184.8 lb

## 2020-10-02 DIAGNOSIS — I2510 Atherosclerotic heart disease of native coronary artery without angina pectoris: Secondary | ICD-10-CM

## 2020-10-02 DIAGNOSIS — R0683 Snoring: Secondary | ICD-10-CM

## 2020-10-02 DIAGNOSIS — E7800 Pure hypercholesterolemia, unspecified: Secondary | ICD-10-CM

## 2020-10-02 DIAGNOSIS — I480 Paroxysmal atrial fibrillation: Secondary | ICD-10-CM

## 2020-10-02 DIAGNOSIS — I5041 Acute combined systolic (congestive) and diastolic (congestive) heart failure: Secondary | ICD-10-CM

## 2020-10-02 DIAGNOSIS — E119 Type 2 diabetes mellitus without complications: Secondary | ICD-10-CM

## 2020-10-02 DIAGNOSIS — I252 Old myocardial infarction: Secondary | ICD-10-CM

## 2020-10-02 MED ORDER — APIXABAN 5 MG PO TABS: 5.0000 mg | ORAL_TABLET | Freq: Two times a day (BID) | ORAL | 3 refills | Status: DC

## 2020-10-02 MED ORDER — CLOPIDOGREL BISULFATE 75 MG PO TABS: 75.0000 mg | ORAL_TABLET | Freq: Every day | ORAL | 3 refills | Status: DC

## 2020-10-02 MED ORDER — LOSARTAN POTASSIUM 25 MG PO TABS: 12.5000 mg | ORAL_TABLET | Freq: Every day | ORAL | 3 refills | Status: DC

## 2020-10-02 MED ORDER — ATORVASTATIN CALCIUM 80 MG PO TABS: 80.0000 mg | ORAL_TABLET | Freq: Every day | ORAL | 3 refills | Status: DC

## 2020-10-02 MED ORDER — AMIODARONE HCL 200 MG PO TABS: ORAL_TABLET | ORAL | 3 refills | Status: DC

## 2020-10-02 MED ORDER — METOPROLOL SUCCINATE ER 25 MG PO TB24: 25.0000 mg | ORAL_TABLET | Freq: Every day | ORAL | 3 refills | Status: DC

## 2020-10-04 ENCOUNTER — Telehealth: Payer: Self-pay | Admitting: Student

## 2020-10-04 DIAGNOSIS — I5042 Chronic combined systolic (congestive) and diastolic (congestive) heart failure: Secondary | ICD-10-CM

## 2020-10-04 DIAGNOSIS — R0601 Orthopnea: Secondary | ICD-10-CM

## 2020-10-04 MED ORDER — FUROSEMIDE 20 MG PO TABS: 20.0000 mg | ORAL_TABLET | ORAL | 3 refills | Status: DC | PRN

## 2020-10-06 ENCOUNTER — Telehealth (HOSPITAL_COMMUNITY): Payer: Self-pay | Admitting: Pharmacist

## 2020-10-06 ENCOUNTER — Other Ambulatory Visit (HOSPITAL_COMMUNITY): Payer: Self-pay

## 2020-10-06 NOTE — Telephone Encounter (Signed)
Pharmacy Transitions of Care Follow-up Telephone Call  Date of discharge: 09/28/2020   How have you been since you were released from the hospital? Tired   Medication changes made at discharge:  - START: amiodarone, clopidogrel and Eliquis  - STOPPED: Brilinta  - CHANGED: ASA  Medication changes verified by the patient? Yes (Yes/No)    Medication Accessibility:  Home Pharmacy: [facility]   Was the patient provided with refills on discharged medications? No   Have all prescriptions been transferred from [facility] to home pharmacy? N/A   Is the patient able to afford medications? Currently trying to obtain medicaid Discussed coupons and pricing for medication.    Medication Review:  APIXABAN (ELIQUIS)  Apixaban 10 mg BID initiated on 09/28/2020. Will switch to apixaban 5 mg BID after 7 days - Discussed importance of taking medication around the same time everyday  - Reviewed potential DDIs with patient  - Advised patient of medications to avoid (NSAIDs, ASA)  - Educated that Tylenol (acetaminophen) will be the preferred analgesic to prevent risk of bleeding  - Emphasized importance of monitoring for signs and symptoms of bleeding (abnormal bruising, prolonged bleeding, nose bleeds, bleeding from gums, discolored urine, black tarry stools)  - Advised patient to alert all providers of anticoagulation therapy prior to starting a new medication or having a procedure    CLOPIDOGREL (PLAVIX) Clopidogrel 75 mg once daily.  - Educated patient on expected duration of therapy of 1 year with clopidogrel. Advised patient that aspirin will be continued indefinitely.  - Reviewed potential DDIs with patient  - Advised patient of medications to avoid (NSAIDs, ASA)  - Educated that Tylenol (acetaminophen) will be the preferred analgesic to prevent risk of bleeding  - Emphasized importance of monitoring for signs and symptoms of bleeding (abnormal bruising, prolonged bleeding, nose bleeds, bleeding  from gums, discolored urine, black tarry stools)  - Advised patient to alert all providers of anticoagulation therapy prior to starting a new medication or having a procedure   Follow-up Appointments:  PCP Hospital f/u appt confirmed? Yes  Specialist Hospital f/u appt confirmed? Yes  If their condition worsens, is the pt aware to call PCP or go to the Emergency Dept.? Yes  Final Patient Assessment: Discussed current medication with regimen.  Reviewed possible side effects.  Patient is in the process of trying to get Medicaid.  Discussed options with her regarding coupons, PAP

## 2020-10-06 NOTE — Telephone Encounter (Signed)
Attempt to call pt, no answer. Left vm requesting call back.

## 2020-10-06 NOTE — Telephone Encounter (Addendum)
ON-CALL CARDIOLOGY 10/04/2020  Patient's name: Alexandra Ward.   MRN: [id number].    DOB: 08/27/1960 Primary care provider: Kirstie Peri, MD. Primary cardiologist: Elvin So, PA-C  Interaction regarding this patient's care today: Patient called with complaints of orthopnea over the last 2 days, stating that she has shortness of breath each time she lays down which is making it difficult for her to sleep.  Denies chest pain, leg swelling, PND.  Denies dizziness, syncope, near syncope.  She continues to have mild dyspnea on exertion which has been stable since discharge from the hospital.  Impression:   ICD-10-CM   1. Orthopnea  R06.01     2. Chronic combined systolic and diastolic heart failure (HCC)  O35.00       Meds ordered this encounter  Medications   furosemide (LASIX) 20 MG tablet    Sig: Take 1 tablet (20 mg total) by mouth as needed for fluid or edema.    Dispense:  30 tablet    Refill:  3    No orders of the defined types were placed in this encounter.   Recommendations: Suspect patient likely has volume overload given her LV systolic dysfunction.  We will send for furosemide 20 mg twice daily for the next 1 week.  Then advised patient to take furosemide as needed for dyspnea or leg swelling.  Patient verbalized understanding agreement.  Also discussed with patient regarding signs symptoms that would warrant urgent or emergent evaluation.  Telephone encounter total time: 14 minutes     Rayford Halsted, PA-C 10/06/2020, 2:52 PM Office: [phone]

## 2020-10-07 ENCOUNTER — Other Ambulatory Visit (HOSPITAL_COMMUNITY): Payer: Self-pay

## 2020-10-07 ENCOUNTER — Telehealth (HOSPITAL_COMMUNITY): Payer: Self-pay | Admitting: Pharmacist

## 2020-10-07 NOTE — Telephone Encounter (Signed)
Pt called back. Patients breathing has been fine. Pt has only taken the fluid pill twice, two days in a row.

## 2020-10-07 NOTE — Telephone Encounter (Signed)
Second attempt to call pt, no answer. Left vm requesting call back.

## 2020-10-07 NOTE — Telephone Encounter (Signed)
No answer, will attempt to call patient again at a later date.

## 2020-10-09 ENCOUNTER — Telehealth: Payer: Self-pay | Admitting: Student

## 2020-10-09 NOTE — Telephone Encounter (Signed)
Patient called the office to report that she has developed rash over the last day starting yesterday around 10 AM rashes on her legs and hands and improves with hydrocortisone cream.  She has had no recent changes in her medications and has been tolerating cardiac medications without issues.  I have low suspicion patient's reaction is related to cardiovascular medications.  Advised patient to continue using hydrocortisone cream and Benadryl and follow-up with PCP.  Also counseled her regarding signs and symptoms that would warrant urgent or emergent evaluation, she verbalized understanding and agreement.

## 2020-10-12 ENCOUNTER — Telehealth (HOSPITAL_COMMUNITY): Payer: Self-pay | Admitting: Pharmacist

## 2020-10-12 NOTE — Telephone Encounter (Signed)
Three attempts made, unable to reach patient. 

## 2020-10-20 ENCOUNTER — Telehealth: Payer: Self-pay

## 2020-10-21 NOTE — Telephone Encounter (Signed)
She has an appt w/ CC on 7/27 and can be discussed at that point.  IF CC agrees, I would decrease Amiodarone to 100mg  po qday which would decrease such interactions.

## 2020-10-22 ENCOUNTER — Ambulatory Visit: Payer: Self-pay | Admitting: Student

## 2020-10-22 ENCOUNTER — Telehealth: Payer: Self-pay

## 2020-10-22 LAB — CMP14+EGFR
ALT: 8 IU/L (ref 0–32)
AST: 12 IU/L (ref 0–40)
Albumin/Globulin Ratio: 1.4 (ref 1.2–2.2)
Albumin: 4.2 g/dL (ref 3.8–4.9)
Alkaline Phosphatase: 87 IU/L (ref 44–121)
BUN/Creatinine Ratio: 10 — ABNORMAL LOW (ref 12–28)
BUN: 9 mg/dL (ref 8–27)
Bilirubin Total: 0.4 mg/dL (ref 0.0–1.2)
CO2: 25 mmol/L (ref 20–29)
Calcium: 9.3 mg/dL (ref 8.7–10.3)
Chloride: 103 mmol/L (ref 96–106)
Creatinine, Ser: 0.89 mg/dL (ref 0.57–1.00)
Globulin, Total: 3 g/dL (ref 1.5–4.5)
Glucose: 112 mg/dL — ABNORMAL HIGH (ref 65–99)
Potassium: 4 mmol/L (ref 3.5–5.2)
Sodium: 149 mmol/L — ABNORMAL HIGH (ref 134–144)
Total Protein: 7.2 g/dL (ref 6.0–8.5)
eGFR: 74 mL/min/{1.73_m2} (ref >59)

## 2020-10-22 LAB — TSH: TSH: 5.74 u[IU]/mL — ABNORMAL HIGH (ref 0.450–4.500)

## 2020-10-22 NOTE — Telephone Encounter (Signed)
Appt is being scheduled with the front now.

## 2020-10-22 NOTE — Telephone Encounter (Signed)
Alexandra Ward, the pts fiance called and stated that the pt went to the hospital this morning because her heart rate was 150, The hospital told her that she was in Afib. Her bp was 101/68. This morning she skipped her Losartan, Her heart rate is still around 120-110. She was given all her other medication as normal. Pt is back home now. Her potassium was low so the hospital gave her 40mg  of potassium chloride.

## 2020-10-22 NOTE — Telephone Encounter (Signed)
Pharmacy called patient isn't taking lexapro. Tried calling patient no answer

## 2020-10-22 NOTE — Telephone Encounter (Signed)
Please add pt to my schedule for tomorrow for a fib.

## 2020-10-22 NOTE — Telephone Encounter (Signed)
She called earlier today as well and I asked Amber to set her up for appt with me in office tomorrow to discuss further and follow up after hospitalization.

## 2020-10-23 ENCOUNTER — Encounter: Payer: Self-pay | Admitting: Student

## 2020-10-23 ENCOUNTER — Ambulatory Visit: Payer: Self-pay | Admitting: Student

## 2020-10-23 ENCOUNTER — Other Ambulatory Visit: Payer: Self-pay

## 2020-10-23 VITALS — BP 128/75 | HR 66 | Temp 98.4°F | Resp 16 | Ht 64.0 in | Wt 181.6 lb

## 2020-10-23 DIAGNOSIS — I5042 Chronic combined systolic (congestive) and diastolic (congestive) heart failure: Secondary | ICD-10-CM

## 2020-10-23 DIAGNOSIS — I480 Paroxysmal atrial fibrillation: Secondary | ICD-10-CM

## 2020-10-23 DIAGNOSIS — I2510 Atherosclerotic heart disease of native coronary artery without angina pectoris: Secondary | ICD-10-CM

## 2020-10-23 MED ORDER — AMIODARONE HCL 100 MG PO TABS: 100.0000 mg | ORAL_TABLET | Freq: Every day | ORAL | 3 refills | Status: DC

## 2020-10-23 NOTE — Progress Notes (Signed)
 Primary Physician/Referring:  Shah, Ashish, MD  Patient ID: Alexandra Ward, female    DOB: 07/13/1960, 59 y.o.   MRN: [id number]  Chief Complaint  Patient presents with   Atrial Fibrillation   Follow-up   HPI:    Alexandra Ward  is a 59 y.o.  female  patient with hyperlipidemia, type 2 DM, h/o stroke (vertebral artery dissection 2018), STEMI (09/21/2020) treated with PCI to prox LAD who presented again 09/28/20 with lightheadedness and concern that her heart rate was elevated 180 bpm, found to be in atrial fibrillation with rapid ventricular response. Echocardiogram revealed moderately reduced LVEF at 30-35% and grade 2 diastolic dysfunction with left ventricular regional wall motion abnormalities. Patient converted to normal sinus rhythm with amiodarone drip and was transitioned to oral medications and started on Eliquis. Patient was discharged 09/29/2020 and presented to the ED again 09/30/2020 following and episode of A fib with RVR per EMS EKG. Patient had spontaneously converted to sinus rhythm prior to arrival in the ED. She was given additional dose of amiodarone 200 mg and discharged home. Patient presented to the ED yesterday (10/22/2020) with a heart rate of 150 while in A fib. Her BP was 101/68 in the ED. Patient was discharged after being given 40 mg of potassium chloride because her potassium was 2.9. CXR also showed signs of volume overload and her pro-BNP was over 3700 in the ED.   Patient was discharged yesterday from ED and now presents for follow-up. The patient's understanding of what occurred in the ED yesterday is the same as above. She endorses that her heart rate remained around 120 bpm while in A fib in the ED. Her BP was also low in the ED so she did not take her Losartan yesterday. They gave her potassium and sent her home still in A fib according to the patient. Today, the patient denies palpitations, chest pain, syncope, or edema. The patient only endorses fatigue today due to  being in the ED for most of the day yesterday. The patient does endorse that she feel spre-syncopal, dizzy, and lightheaded before A fib starts.   The patient is still currently taking 200 mg of Amiodarone twice a day for her A fib. The patient had been instructed at her last visit to decrease this dose to 200 mg once a day.  The patient is also on Eliquis. The patient denies melena, hematochezia, or hematuria.   BP dropped in the ED. Did not take Losartan yesterday. Monitors BP at home. Her pressure at home was 119/71 on 10-22-20 with no Losartan. 138/78 on 10/21/2020.   The patient did not receive any Lasix in the ED despite the CXR findings (see below) and elevated BNP. Patient has not taken Lasix at home recently. She denies any edema or increased SOB - she has had stable SOB since her MI.    The patient has decreased her smoking to 5 cigarettes a day. She has nicotine lozenges at home that she plans to try. She has not tried any nicotine patches or gum.   The patient is not doing much activity wise. She is doing light housekeeping, walking her dogs around the yard, and going to the grocery store. She denies any new difficulties in performing these activities.   Past Medical History:  Diagnosis Date   Diabetes mellitus without complication (HCC)    Hyperlipidemia    Stroke (HCC)    Vertebral artery dissection 2018   Past Surgical History:  Procedure Laterality   Date   CORONARY THROMBECTOMY N/A 09/21/2020   Procedure: Coronary Thrombectomy;  Surgeon: Ganji, Jay, MD;  Location: [facility];  Service: Cardiovascular;  Laterality: N/A;   CORONARY/GRAFT ACUTE MI REVASCULARIZATION N/A 09/21/2020   Procedure: Coronary/Graft Acute MI Revascularization;  Surgeon: Ganji, Jay, MD;  Location: [facility];  Service: Cardiovascular;  Laterality: N/A;   LEFT HEART CATH AND CORONARY ANGIOGRAPHY N/A 09/21/2020   Procedure: LEFT HEART CATH AND CORONARY ANGIOGRAPHY;  Surgeon: Ganji, Jay, MD;   Location: [facility];  Service: Cardiovascular;  Laterality: N/A;   History reviewed. No pertinent family history.  Social History   Tobacco Use   Smoking status: Every Day    Types: Cigarettes   Smokeless tobacco: Never  Substance Use Topics   Alcohol use: No   Marital Status: Divorced   ROS  Review of Systems  Constitutional: Positive for malaise/fatigue.  Cardiovascular:  Positive for near-syncope (during a fib episode). Negative for chest pain, claudication, leg swelling, palpitations and syncope.  Respiratory:  Positive for shortness of breath (chronic and stable).   Gastrointestinal:  Negative for hematochezia and melena.  Genitourinary:  Negative for hematuria.  Neurological:  Positive for dizziness (during a fib episode) and light-headedness.  Psychiatric/Behavioral:  Positive for depression.    Objective  Blood pressure 128/75, pulse 66, temperature 98.4 F (36.9 C), temperature source Temporal, resp. rate 16, height 5' 4" (1.626 m), weight 181 lb 9.6 oz (82.4 kg), SpO2 97 %.  Vitals with BMI 10/23/2020 10/02/2020 09/30/2020  Height 5' 4" 5' 4" -  Weight 181 lbs 10 oz 184 lbs 13 oz -  BMI 31.16 31.71 -  Systolic 128 98 106  Diastolic 75 56 67  Pulse 66 70 65      Physical Exam Vitals reviewed.  Constitutional:      Appearance: She is not ill-appearing.  Neck:     Vascular: No carotid bruit.  Cardiovascular:     Rate and Rhythm: Normal rate and regular rhythm.     Pulses: Intact distal pulses.          Carotid pulses are 2+ on the right side and 2+ on the left side.      Radial pulses are 2+ on the right side and 2+ on the left side.       Popliteal pulses are 2+ on the right side and 2+ on the left side.       Dorsalis pedis pulses are 1+ on the right side and 0 on the left side.       Posterior tibial pulses are 1+ on the right side and 1+ on the left side.     Heart sounds: S1 normal and S2 normal. No murmur heard.   No gallop.  Pulmonary:      Effort: Pulmonary effort is normal. No respiratory distress.     Breath sounds: Wheezing (worse on the left) present. No rhonchi or rales.  Musculoskeletal:     Right lower leg: No edema.     Left lower leg: No edema.  Neurological:     Mental Status: She is alert.    Laboratory examination:   Recent Labs    09/28/20 0824 09/29/20 0148 09/30/20 0126 10/21/20 1040  NA 140 138 140 149*  K 3.6 3.4* 3.5 4.0  CL 109 107 111 103  CO2 24 23 22 25  GLUCOSE 177* 147* 183* 112*  BUN 10 14 13 9  CREATININE 0.72 0.68 0.72 0.89    CALCIUM 8.5* 8.5* 8.5* 9.3  GFRNONAA >60 >60 >60  --    estimated creatinine clearance is 69.8 mL/min (by C-G formula based on SCr of 0.89 mg/dL).  CMP Latest Ref Rng & Units 10/21/2020 09/30/2020 09/29/2020  Glucose 65 - 99 mg/dL 112(H) 183(H) 147(H)  BUN 8 - 27 mg/dL _0 Creatinine 0.57 - 1.00 mg/dL 0.89 0.72 0.68  Sodium 134 - 144 mmol/L 149(H) 140 138  Potassium 3.5 - 5.2 mmol/L 4.0 3.5 3.4(L)  Chloride 96 - 106 mmol/L 103 111 107  CO2 20 - 29 mmol/L _1 Calcium 8.7 - 10.3 mg/dL 9.3 8.5(L) 8.5(L)  Total Protein 6.0 - 8.5 g/dL 7.2 - -  Total Bilirubin 0.0 - 1.2 mg/dL 0.4 - -  Alkaline Phos 44 - 121 IU/L 87 - -  AST 0 - 40 IU/L 12 - -  ALT 0 - 32 IU/L 8 - -   CBC Latest Ref Rng & Units 09/30/2020 09/29/2020 09/28/2020  WBC 4.0 - 10.5 K/uL 16.9(H) 16.4(H) 19.3(H)  Hemoglobin 12.0 - 15.0 g/dL 11.9(L) 11.5(L) 11.9(L)  Hematocrit 36.0 - 46.0 % 36.4 35.4(L) 36.7  Platelets 150 - 400 K/uL 379 352 401(H)    Lipid Panel Recent Labs    09/21/20 0659  CHOL 146  TRIG 104  LDLCALC 101*  VLDL 21  HDL 24*  CHOLHDL 6.1   HEMOGLOBIN A1C Lab Results  Component Value Date   HGBA1C 7.4 (H) 09/21/2020   MPG 166 09/21/2020   TSH Recent Labs    10/21/20 1040  TSH 5.740*    External labs:   10/22/2020:  WBC 15.8, HGB 13.6, HCT 40.9, MCV 86.8, Platelets 315 Na 143, K 2.9, Cl 105, Magnesium 1.9, Creatinine 0.81, eGFR 83 PT 10.9, INR  0.87 Pro-BNP 3,700 Benzodiazepines - positive  Allergies   Allergies  Allergen Reactions   Hydrocodone Nausea And Vomiting   Hydrocodone Nausea And Vomiting   Other Nausea And Vomiting and Other (See Comments)    States all pain medications make her very ill- vomits   Tyloxapol Nausea And Vomiting   Oxycodone-Acetaminophen Nausea And Vomiting   Prednisone Rash and Other (See Comments)    Chest pain and a short-lived rash on the neck      Medications Prior to Visit:   Outpatient Medications Prior to Visit  Medication Sig Dispense Refill   apixaban (ELIQUIS) 5 MG TABS tablet Take 1 tablet (5 mg total) by mouth 2 (two) times daily. 60 tablet 3   Artificial Tear Ointment (DRY EYES OP) Apply 1 drop to eye daily as needed (dry eyes).     aspirin 81 MG chewable tablet Chew 1 tablet (81 mg total) by mouth daily. 30 tablet 0   atorvastatin (LIPITOR) 80 MG tablet Take 1 tablet (80 mg total) by mouth daily at 8 pm. 30 tablet 3   clopidogrel (PLAVIX) 75 MG tablet Take 1 tablet (75 mg total) by mouth daily. 30 tablet 3   LUMIFY 0.025 % SOLN Place 1 drop into both eyes daily as needed (for irritation).     metFORMIN (GLUCOPHAGE) 500 MG tablet Take 500-1,000 mg by mouth See admin instructions. 1000 mg in the morning 500 mg in the evening     metoprolol succinate (TOPROL-XL) 25 MG 24 hr tablet Take 1 tablet (25 mg total) by mouth daily. 30 tablet 3   ondansetron (ZOFRAN-ODT) 4 MG disintegrating tablet Take by mouth.     potassium chloride (KLOR-CON) 10 MEQ tablet Take  by mouth.     amiodarone (PACERONE) 200 MG tablet 200 mg twice daily for 1 week. Then 200 mg once daily. 45 tablet 3   furosemide (LASIX) 20 MG tablet Take 1 tablet (20 mg total) by mouth as needed for fluid or edema. (Patient not taking: Reported on 10/23/2020) 30 tablet 3   losartan (COZAAR) 25 MG tablet Take 0.5 tablets (12.5 mg total) by mouth daily. (Patient not taking: Reported on 10/23/2020) 15 tablet 3   No  facility-administered medications prior to visit.     Final Medications at End of Visit    Current Meds  Medication Sig   apixaban (ELIQUIS) 5 MG TABS tablet Take 1 tablet (5 mg total) by mouth 2 (two) times daily.   Artificial Tear Ointment (DRY EYES OP) Apply 1 drop to eye daily as needed (dry eyes).   aspirin 81 MG chewable tablet Chew 1 tablet (81 mg total) by mouth daily.   atorvastatin (LIPITOR) 80 MG tablet Take 1 tablet (80 mg total) by mouth daily at 8 pm.   clopidogrel (PLAVIX) 75 MG tablet Take 1 tablet (75 mg total) by mouth daily.   LUMIFY 0.025 % SOLN Place 1 drop into both eyes daily as needed (for irritation).   metFORMIN (GLUCOPHAGE) 500 MG tablet Take 500-1,000 mg by mouth See admin instructions. 1000 mg in the morning 500 mg in the evening   metoprolol succinate (TOPROL-XL) 25 MG 24 hr tablet Take 1 tablet (25 mg total) by mouth daily.   ondansetron (ZOFRAN-ODT) 4 MG disintegrating tablet Take by mouth.   potassium chloride (KLOR-CON) 10 MEQ tablet Take by mouth.   [DISCONTINUED] amiodarone (PACERONE) 200 MG tablet 200 mg twice daily for 1 week. Then 200 mg once daily.   Radiology:   CXR on 10/22/2020:  New hazy density at the right base which could be pleural fluid or parenchymal. In the setting of chest pain, pulmonary infarction should be considered.  Cardiac Studies:   Coronary arteriogram 09/21/2020: LV hemodynamics: 121/18, EDP 21 mmHg.  Ao 126/74, mean 97 mmHg.  No pressure gradient across the aortic valve. LV: EF 30 to 35% with mid to distal anterior, anteroapical, apical and inferior apical akinesis to dyskinesis. LM: Large-caliber vessel.  Smooth and normal. LAD: Large vessel, just immediately after the first septal perforator, the LAD is flush occluded.  Small diagonal arises at this segment.  The LAD otherwise is a large wraparound LAD and supplies large part of the apical and inferoapical region.  There are collaterals to the right coronary  artery. CX: Moderate to large vessel, appears smooth and normal. RCA: Severely diseased diffusely, mid segment appears subtotally to totally occluded with TIMI I flow.  Distal bed of the RCA could not be seen due to minimal flow.  There are ipsilateral and contralateral collaterals to the distal right.   Intervention: Successful stenting of the proximal and mid segment of the LAD with a long 3.0 x 24 mm Synergy XD DES following aspiration thrombectomy, stent deployed at a peak pressure of 18 atmospheric pressure, stenosis reduced from 100% to 0% with stepup and stepdown and TIMI 0 to TIMI-3 flow at the end of the procedure. I did attempt to cross the RCA however extremely difficult and appeared to behave like a CTO, lesion left alone.  Patient was completely asymptomatic throughout the procedure. 135 mill contrast utilized.    Echocardiogram 09/21/2020:  1. Left ventricular ejection fraction, by estimation, is 30 to 35%. The  left ventricle has  moderately decreased function. The left ventricle  demonstrates regional wall motion abnormalities (see scoring  diagram/findings for description). Left ventricular   diastolic parameters are consistent with Grade II diastolic dysfunction  (pseudonormalization). Elevated left ventricular end-diastolic pressure.  The E/e' is 22. There is akinesis of the left ventricular, mid-apical  anterior wall, apical segment,  anteroseptal wall, inferoseptal wall and anterolateral wall. There is  akinesis of the left ventricular, apical inferior wall and apical segment.   2. Right ventricular systolic function is normal. The right ventricular  size is normal.   3. The mitral valve is normal in structure. No evidence of mitral valve  regurgitation. No evidence of mitral stenosis.   4. The aortic valve is tricuspid. Aortic valve regurgitation is not  visualized. Mild aortic valve sclerosis is present, with no evidence of  aortic valve stenosis.   5. The inferior vena  cava is dilated in size with <50% respiratory  variability, suggesting right atrial pressure of 15 mmHg.  EKG:   10/23/2020: Normal sinus rhythm at a rate of 63 bpm. Right axis deviation. Poor R wave progression. Possible anteroseptal infarct, old. Cannot rule out ischemia. Stable compared to 10/02/2020.   10/02/2020: Sinus rhythm at a rate of 65 bpm.  Right axis Anterolateral infarct, age indeterminate.  Low voltage complexes, consider pulmonary disease pattern.  09/28/2020: Afib w/RVR 199 bpm Anterolateral infarct, age indeterminate  09/21/2020: Normal sinus rhythm, normal axis, diffuse anterolateral and inferior STEMI.  Low-voltage complexes.  Assessment     ICD-10-CM   1. Paroxysmal atrial fibrillation (HCC)  I48.0 EKG 12-Lead    Ambulatory referral to Cardiac Electrophysiology    2. Chronic combined systolic and diastolic heart failure (HCC)  I50.42     3. Coronary artery disease involving native coronary artery of native heart without angina pectoris  I25.10        Medications Discontinued During This Encounter  Medication Reason   amiodarone (PACERONE) 200 MG tablet     Meds ordered this encounter  Medications   amiodarone (PACERONE) 100 MG tablet    Sig: Take 1 tablet (100 mg total) by mouth daily.    Dispense:  30 tablet    Refill:  3   This patients CHA2DS2-VASc Score 4 (MI, F, stroke)and yearly risk of stroke 4.8%.   Recommendations:   Alexandra Ward is a 59 y.o. female  patient with hyperlipidemia, type 2 DM, h/o stroke (vertebral artery dissection 2018), STEMI (09/21/2020) treated with PCI to prox LAD who presented again 09/28/20 with lightheadedness and concern that her heart rate was elevated 180 bpm, found to be in atrial fibrillation with rapid ventricular response.  Echocardiogram revealed moderately reduced LVEF at 30-35% and grade 2 diastolic dysfunction with left ventricular regional wall motion abnormalities.  EKG today reveals that the patient is in normal  sinus rhythm with no acute changes when compared to the last EKG. Patient's spell of A fib yesterday was likely due to her low potasium. The patient should continue taking her potassium supplement as prescribed by the ED. Will continue to monitor the patient's A fib.   The patient has been taking too much amiodarone despite being instructed to decrease her dose at prior visit. Labs on 10/21/2020 revealed that the patient's TSH was elevated at 5.740, likely due to thyroid dysfunction caused by the amiodarone. Patient was instructed today to only take 200 mg of Amiodarone daily. Will also refer to EP for further management of a fib and consideration for ablation.     Patient is presently maintaining sinus rhythm and is tolerating anticoagulation without bleeding diathesis.  We will continue other guideline directed medical therapy for CAD as well as treatment ofAtrial fibrillation including Eliquis, atorvastatin, clopidogrel, aspirin metoprolol.   As patient is presently being treated with amiodarone, they will need annual monitoring of PFTs, thyroid function, liver function, and ophthalmologic exam. Patient is aware of these monitoring parameters and agrees.    Due to the CXR findings and elevated pro-BNP in the ED, the patient was instructed to take her furosemide for the next 2 days to clear up excess fluid. Will reassess volume status at next office visit.   Patient should continue taking 12.5 mg of Losartan for the benefits to her heart despite some lower blood pressure readings. Her blood pressure was normal in the office today at 128/75. The patient should continue to monitor her BP at home. If the patient begins to experience dizziness or syncope, she should let the office know.   Patient was found to be positive for benzodiazepines in the ED despite the patient not having any Benzodiazepines prescribed to her. Patient was encouraged to establish with a PCP. Patient was encouraged to discuss her  anxiety with a PCP so that she can have an appropriate and safe medication prescribed to her for her mental health. Patient voiced understanding about safety concerns and stated that she will find a PCP.   Patient endorsed snoring at her last office visit. The patient should continue to pursue getting a sleep study to evaluate for OSA.  The patient should continue working towards smoking cessation.   Will continue to follow patient closely, follow-up in 2 weeks, sooner if needed for CAD and A. fib.   Patient was seen in collaboration with [employer] student Summer Lackey who acted as scribe and participated in writing the above note. I personally reviewed patient records, interviewed patient, and examined patient. I agree with above assessment and plan. I also discussed case with Dr. Patwardhan who is in agreement with the plan.    Celeste C Cantwell, PA-C 10/24/2020, 7:01 PM Office: [phone]  

## 2020-10-26 NOTE — Telephone Encounter (Signed)
Pt returned call, requested to speak with CC

## 2020-10-26 NOTE — Telephone Encounter (Signed)
-----   Message from Rayford Halsted, [place] sent at 10/24/2020  7:02 PM EDT ----- Please call patient and inform her that I spoke to MP and we feel it is best to leave her on amiodarone 200 mg once daily instead of 100 mg once daily and refer her to EP. I left patient message over the weekend but wanted to make sure she understands.

## 2020-10-26 NOTE — Telephone Encounter (Signed)
Pt called back and would like for you to give her a call back

## 2020-10-28 ENCOUNTER — Ambulatory Visit: Payer: Self-pay | Admitting: Student

## 2020-11-04 ENCOUNTER — Ambulatory Visit: Payer: Self-pay | Admitting: Student

## 2020-11-04 ENCOUNTER — Other Ambulatory Visit: Payer: Self-pay

## 2020-11-04 ENCOUNTER — Encounter: Payer: Self-pay | Admitting: Student

## 2020-11-04 VITALS — BP 112/60 | HR 61 | Temp 97.2°F | Ht 64.0 in | Wt 179.0 lb

## 2020-11-04 DIAGNOSIS — I2510 Atherosclerotic heart disease of native coronary artery without angina pectoris: Secondary | ICD-10-CM

## 2020-11-04 DIAGNOSIS — I480 Paroxysmal atrial fibrillation: Secondary | ICD-10-CM

## 2020-11-04 DIAGNOSIS — F17210 Nicotine dependence, cigarettes, uncomplicated: Secondary | ICD-10-CM

## 2020-11-04 DIAGNOSIS — I5042 Chronic combined systolic (congestive) and diastolic (congestive) heart failure: Secondary | ICD-10-CM

## 2020-11-04 NOTE — Progress Notes (Signed)
Primary Physician/Referring:  Kirstie Peri, MD  Patient ID: Alexandra Ward, female    DOB: Dec 29, 1960, 60 y.o.   MRN: [id number]  Chief Complaint  Patient presents with   Coronary Artery Disease   Follow-up   Atrial Fibrillation   Hospitalization Follow-up   HPI:    Alexandra Ward  is a 60 y.o.  female  patient with hyperlipidemia, ongoing tobacco use, type 2 DM, h/o stroke (vertebral artery dissection 2018), STEMI (09/21/2020) treated with PCI to prox LAD, presented again 09/28/2020 and atrial fibrillation with RVR and LVEF 30-35%.  He was started on amiodarone and Eliquis and discharged.  Since then patient has had 2 emergency room visits with atrial fibrillation with RVR.  Patient was last seen in our office 10/23/2020 following discharge from the ED the day prior.  At that time patient had been mistakenly taking amiodarone 200 mg twice daily and TSH was elevated at 5.74.  Patient was advised to reduce amiodarone to 200 mg once daily and refer to electrophysiology for further management of atrial fibrillation consideration for ablation.  Patient presents today for follow-up.  Overall patient is feeling much improved with significantly more energy.  She has been increasing her physical activity and making diet changes.  Patient has lost an additional 5 pounds since 10/02/2020, she is congratulated.  Patient denies chest pain, dizziness, syncope, near syncope.  She has had no known recurrence of atrial fibrillation since last visit, notably patient is quite symptomatic during episodes of atrial fibrillation.  Past Medical History:  Diagnosis Date   Diabetes mellitus without complication (HCC)    Hyperlipidemia    Stroke [facility])    Vertebral artery dissection 2018   Past Surgical History:  Procedure Laterality Date   CORONARY THROMBECTOMY N/A 09/21/2020   Procedure: Coronary Thrombectomy;  Surgeon: Yates Decamp, MD;  Location: [facility] INVASIVE CV LAB;  Service: Cardiovascular;  Laterality: N/A;    CORONARY/GRAFT ACUTE MI REVASCULARIZATION N/A 09/21/2020   Procedure: Coronary/Graft Acute MI Revascularization;  Surgeon: Yates Decamp, MD;  Location: [facility] INVASIVE CV LAB;  Service: Cardiovascular;  Laterality: N/A;   LEFT HEART CATH AND CORONARY ANGIOGRAPHY N/A 09/21/2020   Procedure: LEFT HEART CATH AND CORONARY ANGIOGRAPHY;  Surgeon: Yates Decamp, MD;  Location: [facility] INVASIVE CV LAB;  Service: Cardiovascular;  Laterality: N/A;   History reviewed. No pertinent family history.  Social History   Tobacco Use   Smoking status: Every Day    Types: Cigarettes   Smokeless tobacco: Never  Substance Use Topics   Alcohol use: No   Marital Status: Divorced   ROS  Review of Systems  Constitutional: Positive for malaise/fatigue (improving). Negative for weight gain.  Cardiovascular:  Positive for dyspnea on exertion (improving). Negative for chest pain, claudication, leg swelling, near-syncope, orthopnea, palpitations, paroxysmal nocturnal dyspnea and syncope.  Neurological:  Negative for dizziness.   Objective  Blood pressure 112/60, pulse 61, temperature (!) 97.2 F (36.2 C), height 5\' 4"  (1.626 m), weight 179 lb (81.2 kg), SpO2 96 %.  Vitals with BMI 11/04/2020 10/23/2020 10/02/2020  Height 5\' 4"  5\' 4"  5\' 4"   Weight 179 lbs 181 lbs 10 oz 184 lbs 13 oz  BMI 30.71 31.16 31.71  Systolic 112 128 98  Diastolic 60 75 56  Pulse 61 66 70      Physical Exam Vitals reviewed.  Neck:     Vascular: No carotid bruit.  Cardiovascular:     Rate and Rhythm: Normal rate and regular rhythm.  Pulses: Intact distal pulses.          Carotid pulses are 2+ on the right side and 2+ on the left side.      Radial pulses are 2+ on the right side and 2+ on the left side.       Popliteal pulses are 2+ on the right side and 2+ on the left side.       Dorsalis pedis pulses are 1+ on the right side and 0 on the left side.       Posterior tibial pulses are 1+ on the right side and 1+ on the left side.     Heart sounds:  S1 normal and S2 normal. No murmur heard.   No gallop.  Pulmonary:     Effort: Pulmonary effort is normal. No respiratory distress.     Breath sounds: No wheezing, rhonchi or rales.  Musculoskeletal:     Right lower leg: No edema.     Left lower leg: No edema.  Neurological:     Mental Status: She is alert.    Laboratory examination:   Recent Labs    09/28/20 0824 09/29/20 0148 09/30/20 0126 10/21/20 1040  NA 140 138 140 149*  K 3.6 3.4* 3.5 4.0  CL 109 107 111 103  CO2 24 23 22 25   GLUCOSE 177* 147* 183* 112*  BUN 10 14 13 9   CREATININE 0.72 0.68 0.72 0.89  CALCIUM 8.5* 8.5* 8.5* 9.3  GFRNONAA >60 >60 >60  --    estimated creatinine clearance is 69.3 mL/min (by C-G formula based on SCr of 0.89 mg/dL).  CMP Latest Ref Rng & Units 10/21/2020 09/30/2020 09/29/2020  Glucose 65 - 99 mg/dL 10/02/2020) 10/01/2020) 202(R)  BUN 8 - 27 mg/dL 9 13 14   Creatinine 0.57 - 1.00 mg/dL 427(C 623(J  Sodium 134 - 144 mmol/L 149(H) 140 138  Potassium 3.5 - 5.2 mmol/L 4.0 3.5 3.4(L)  Chloride 96 - 106 mmol/L 103 111 107  CO2 20 - 29 mmol/L 25 22 23   Calcium 8.7 - 10.3 mg/dL 9.3 6.28) 3.15)  Total Protein 6.0 - 8.5 g/dL 7.2 - -  Total Bilirubin 0.0 - 1.2 mg/dL 0.4 - -  Alkaline Phos 44 - 121 IU/L 87 - -  AST 0 - 40 IU/L 12 - -  ALT 0 - 32 IU/L 8 - -   CBC Latest Ref Rng & Units 09/30/2020 09/29/2020 09/28/2020  WBC 4.0 - 10.5 K/uL 16.9(H) 16.4(H) 19.3(H)  Hemoglobin 12.0 - 15.0 g/dL 11.9(L) 11.5(L) 11.9(L)  Hematocrit 36.0 - 46.0 % 36.4 35.4(L) 36.7  Platelets 150 - 400 K/uL 379 352 401(H)    Lipid Panel Recent Labs    09/21/20 0659  CHOL 146  TRIG 104  LDLCALC 101*  VLDL 21  HDL 24*  CHOLHDL 6.1    HEMOGLOBIN A1C Lab Results  Component Value Date   HGBA1C 7.4 (H) 09/21/2020   MPG 166 09/21/2020   TSH Recent Labs    10/21/20 1040  TSH 5.740*    External labs:   None   Allergies   Allergies  Allergen Reactions   Hydrocodone Nausea And Vomiting   Hydrocodone  Nausea And Vomiting   Other Nausea And Vomiting and Other (See Comments)    States all pain medications make her very ill- vomits   Tyloxapol Nausea And Vomiting   Oxycodone-Acetaminophen Nausea And Vomiting   Prednisone Rash and Other (See Comments)    Chest pain and a short-lived rash on  the neck      Medications Prior to Visit:   Outpatient Medications Prior to Visit  Medication Sig Dispense Refill   amiodarone (PACERONE) 100 MG tablet Take 1 tablet (100 mg total) by mouth daily. (Patient taking differently: Take 200 mg by mouth daily.) 30 tablet 3   apixaban (ELIQUIS) 5 MG TABS tablet Take 1 tablet (5 mg total) by mouth 2 (two) times daily. 60 tablet 3   Artificial Tear Ointment (DRY EYES OP) Apply 1 drop to eye daily as needed (dry eyes).     aspirin 81 MG chewable tablet Chew 1 tablet (81 mg total) by mouth daily. 30 tablet 0   atorvastatin (LIPITOR) 80 MG tablet Take 1 tablet (80 mg total) by mouth daily at 8 pm. 30 tablet 3   clopidogrel (PLAVIX) 75 MG tablet Take 1 tablet (75 mg total) by mouth daily. 30 tablet 3   furosemide (LASIX) 20 MG tablet Take 1 tablet (20 mg total) by mouth as needed for fluid or edema. 30 tablet 3   losartan (COZAAR) 25 MG tablet Take 0.5 tablets (12.5 mg total) by mouth daily. 15 tablet 3   LUMIFY 0.025 % SOLN Place 1 drop into both eyes daily as needed (for irritation).     metFORMIN (GLUCOPHAGE) 500 MG tablet Take 500-1,000 mg by mouth See admin instructions. 1000 mg in the morning 500 mg in the evening     metoprolol succinate (TOPROL-XL) 25 MG 24 hr tablet Take 1 tablet (25 mg total) by mouth daily. (Patient taking differently: Take 12.5 mg by mouth daily.) 30 tablet 3   potassium chloride (KLOR-CON) 10 MEQ tablet Take by mouth. (Patient not taking: Reported on 11/04/2020)     No facility-administered medications prior to visit.     Final Medications at End of Visit    Current Meds  Medication Sig   amiodarone (PACERONE) 100 MG tablet Take 1  tablet (100 mg total) by mouth daily. (Patient taking differently: Take 200 mg by mouth daily.)   apixaban (ELIQUIS) 5 MG TABS tablet Take 1 tablet (5 mg total) by mouth 2 (two) times daily.   Artificial Tear Ointment (DRY EYES OP) Apply 1 drop to eye daily as needed (dry eyes).   aspirin 81 MG chewable tablet Chew 1 tablet (81 mg total) by mouth daily.   atorvastatin (LIPITOR) 80 MG tablet Take 1 tablet (80 mg total) by mouth daily at 8 pm.   clopidogrel (PLAVIX) 75 MG tablet Take 1 tablet (75 mg total) by mouth daily.   furosemide (LASIX) 20 MG tablet Take 1 tablet (20 mg total) by mouth as needed for fluid or edema.   losartan (COZAAR) 25 MG tablet Take 0.5 tablets (12.5 mg total) by mouth daily.   LUMIFY 0.025 % SOLN Place 1 drop into both eyes daily as needed (for irritation).   metFORMIN (GLUCOPHAGE) 500 MG tablet Take 500-1,000 mg by mouth See admin instructions. 1000 mg in the morning 500 mg in the evening   metoprolol succinate (TOPROL-XL) 25 MG 24 hr tablet Take 1 tablet (25 mg total) by mouth daily. (Patient taking differently: Take 12.5 mg by mouth daily.)   Radiology:   No results found.  Cardiac Studies:   Coronary arteriogram 09/21/2020: LV hemodynamics: 121/18, EDP 21 mmHg.  Ao 126/74, mean 97 mmHg.  No pressure gradient across the aortic valve. LV: EF 30 to 35% with mid to distal anterior, anteroapical, apical and inferior apical akinesis to dyskinesis. LM: Large-caliber vessel.  Smooth  and normal. LAD: Large vessel, just immediately after the first septal perforator, the LAD is flush occluded.  Small diagonal arises at this segment.  The LAD otherwise is a large wraparound LAD and supplies large part of the apical and inferoapical region.  There are collaterals to the right coronary artery. CX: Moderate to large vessel, appears smooth and normal. RCA: Severely diseased diffusely, mid segment appears subtotally to totally occluded with TIMI I flow.  Distal bed of the RCA  could not be seen due to minimal flow.  There are ipsilateral and contralateral collaterals to the distal right.   Intervention: Successful stenting of the proximal and mid segment of the LAD with a long 3.0 x 24 mm Synergy XD DES following aspiration thrombectomy, stent deployed at a peak pressure of 18 atmospheric pressure, stenosis reduced from 100% to 0% with stepup and stepdown and TIMI 0 to TIMI-3 flow at the end of the procedure. I did attempt to cross the RCA however extremely difficult and appeared to behave like a CTO, lesion left alone.  Patient was completely asymptomatic throughout the procedure. 135 mill contrast utilized.    Echocardiogram 09/21/2020:  1. Left ventricular ejection fraction, by estimation, is 30 to 35%. The  left ventricle has moderately decreased function. The left ventricle  demonstrates regional wall motion abnormalities (see scoring  diagram/findings for description). Left ventricular   diastolic parameters are consistent with Grade II diastolic dysfunction  (pseudonormalization). Elevated left ventricular end-diastolic pressure.  The E/e' is 22. There is akinesis of the left ventricular, mid-apical  anterior wall, apical segment,  anteroseptal wall, inferoseptal wall and anterolateral wall. There is  akinesis of the left ventricular, apical inferior wall and apical segment.   2. Right ventricular systolic function is normal. The right ventricular  size is normal.   3. The mitral valve is normal in structure. No evidence of mitral valve  regurgitation. No evidence of mitral stenosis.   4. The aortic valve is tricuspid. Aortic valve regurgitation is not  visualized. Mild aortic valve sclerosis is present, with no evidence of  aortic valve stenosis.   5. The inferior vena cava is dilated in size with <50% respiratory  variability, suggesting right atrial pressure of 15 mmHg.  EKG:   11/04/2020: Sinus rhythm at a rate of 63 bpm.  Right axis.  Anterolateral  infarct, age undetermined.  Low voltage complexes.  10/02/2020: Sinus rhythm at a rate of 65 bpm.  Right axis Anterolateral infarct, age indeterminate.  Low voltage complexes, consider pulmonary disease pattern.  09/28/2020: Afib w/RVR 199 bpm Anterolateral infarct, age indeterminate  09/21/2020: Normal sinus rhythm, normal axis, diffuse anterolateral and inferior STEMI.  Low-voltage complexes.  Assessment     ICD-10-CM   1. Paroxysmal atrial fibrillation (HCC)  I48.0 EKG 12-Lead    2. Chronic combined systolic and diastolic heart failure (HCC)  Y69.48     3. Coronary artery disease involving native coronary artery of native heart without angina pectoris  I25.10     4. Nicotine dependence, cigarettes, uncomplicated  F17.210        There are no discontinued medications.   No orders of the defined types were placed in this encounter.  This patients CHA2DS2-VASc Score 4 (MI, F, stroke)and yearly risk of stroke 4.8%.   Recommendations:   TOPACIO CELLA is a 60 y.o. female  patient with hyperlipidemia, ongoing tobacco use, type 2 DM, h/o stroke (vertebral artery dissection 2018), STEMI (09/21/2020) treated with PCI to prox LAD, presented again  09/28/2020 and atrial fibrillation with RVR and LVEF 30-35%.  He was started on amiodarone and Eliquis and discharged.  Since then patient has had 2 emergency room visits with atrial fibrillation with RVR.  Patient was last seen in our office 10/23/2020 following discharge from the ED the day prior.  At that time patient had been mistakenly taking amiodarone 200 mg twice daily and TSH was elevated at 5.74.  Patient was advised to reduce amiodarone to 200 mg once daily and refer to electrophysiology for further management of atrial fibrillation consideration for ablation.  Patient presents today for follow-up.  Feeling well overall without notable evidence of heart failure.  She has had no known recurrence of atrial fibrillation since last office visit.   Advised patient to continue Eliquis as she is tolerating.  Bleeding diathesis.  We will also continue amiodarone 200 mg once daily.  We will await further recommendations pending patient's consultation with cardiac electrophysiology scheduled for 12/03/2020.  She is currently in sinus rhythm at today's visit.  Blood pressure is well controlled.  She has had no anginal symptoms.  We will continue guideline directed medical therapy including atorvastatin, aspirin, Plavix, metoprolol, and losartan.  Given patient's LV systolic dysfunction, would likely benefit from transition from losartan to [facility], and could consider addition of spironolactone to her SGLT2 inhibitor in the future.  However given soft blood pressure will hold off at this time and plan to repeat echocardiogram at next visit.   As patient is presently being treated with amiodarone, they will need annual monitoring of PFTs, thyroid function, liver function, and ophthalmologic exam. Patient is aware of these monitoring parameters and agrees.   Patient is congratulated on diet and lifestyle modifications that she has made.  Encouraged to continue to do so.  Spent 5 minutes counseling regarding tobacco cessation.  Follow-up in 3 months, sooner if needed, for CAD, hypertension, hyperlipidemia, atrial fibrillation.   Rayford Halsted, PA-C 11/04/2020, 10:34 AM Office: [phone]

## 2020-11-04 NOTE — Progress Notes (Deleted)
Primary Physician/Referring:  Monico Blitz, MD  Patient ID: Alexandra Ward, female    DOB: [birth year], 60 y.o.   MRN: [id number]  Chief Complaint  Patient presents with   Coronary Artery Disease   Follow-up   Atrial Fibrillation   Hospitalization Follow-up   HPI:    HAJIRA Ward  is a 60 y.o.  female  patient with hyperlipidemia, type 2 DM, h/o stroke (vertebral artery dissection 2018), STEMI (09/21/2020) treated with PCI to prox LAD who presented again 09/28/20 with lightheadedness and concern that her heart rate was elevated 180 bpm, found to be in atrial fibrillation with rapid ventricular response. Echocardiogram revealed moderately reduced LVEF at 30-35% and grade 2 diastolic dysfunction with left ventricular regional wall motion abnormalities. Patient converted to normal sinus rhythm with amiodarone drip and was transitioned to oral medications and started on Eliquis. Patient was discharged 09/29/2020 and presented to the ED again 09/30/2020 following and episode of A fib with RVR per EMS EKG. Patient had spontaneously converted to sinus rhythm prior to arrival in the ED. She was given additional dose of amiodarone 200 mg and discharged home. Patient presented to the ED yesterday (10/22/2020) with a heart rate of 150 while in A fib. Her BP was 101/68 in the ED. Patient was discharged after being given 40 mg of potassium chloride because her potassium was 2.9. CXR also showed signs of volume overload and her pro-BNP was over 3700 in the ED.   Patient was discharged yesterday from ED and now presents for follow-up. The patient's understanding of what occurred in the ED yesterday is the same as above. She endorses that her heart rate remained around 120 bpm while in A fib in the ED. Her BP was also low in the ED so she did not take her Losartan yesterday. They gave her potassium and sent her home still in A fib according to the patient. Today, the patient denies palpitations, chest pain, syncope,  or edema. The patient only endorses fatigue today due to being in the ED for most of the day yesterday. The patient does endorse that she feel spre-syncopal, dizzy, and lightheaded before A fib starts.   The patient is still currently taking 200 mg of Amiodarone twice a day for her A fib. The patient had been instructed at her last visit to decrease this dose to 200 mg once a day.  The patient is also on Eliquis. The patient denies melena, hematochezia, or hematuria.   BP dropped in the ED. Did not take Losartan yesterday. Monitors BP at home. Her pressure at home was 119/71 on 10-22-20 with no Losartan. 138/78 on 10/21/2020.   The patient did not receive any Lasix in the ED despite the CXR findings (see below) and elevated BNP. Patient has not taken Lasix at home recently. She denies any edema or increased SOB - she has had stable SOB since her MI.    The patient has decreased her smoking to 5 cigarettes a day. She has nicotine lozenges at home that she plans to try. She has not tried any nicotine patches or gum.   The patient is not doing much activity wise. She is doing light housekeeping, walking her dogs around the yard, and going to the grocery store. She denies any new difficulties in performing these activities.   ***Sleep study? EP?   Past Medical History:  Diagnosis Date   Diabetes mellitus without complication ([place])    Hyperlipidemia    Stroke ([place])  Vertebral artery dissection 2018   Past Surgical History:  Procedure Laterality Date   CORONARY THROMBECTOMY N/A 09/21/2020   Procedure: Coronary Thrombectomy;  Surgeon: Adrian Prows, MD;  Location: [facility];  Service: Cardiovascular;  Laterality: N/A;   CORONARY/GRAFT ACUTE MI REVASCULARIZATION N/A 09/21/2020   Procedure: Coronary/Graft Acute MI Revascularization;  Surgeon: Adrian Prows, MD;  Location: [facility];  Service: Cardiovascular;  Laterality: N/A;   LEFT HEART CATH AND CORONARY ANGIOGRAPHY N/A 09/21/2020    Procedure: LEFT HEART CATH AND CORONARY ANGIOGRAPHY;  Surgeon: Adrian Prows, MD;  Location: [facility];  Service: Cardiovascular;  Laterality: N/A;   No family history on file.  Social History   Tobacco Use   Smoking status: Every Day    Types: Cigarettes   Smokeless tobacco: Never  Substance Use Topics   Alcohol use: No   Marital Status: Divorced   ROS  Review of Systems  Constitutional: Positive for malaise/fatigue.  Cardiovascular:  Positive for near-syncope (during a fib episode). Negative for chest pain, claudication, leg swelling, palpitations and syncope.  Respiratory:  Positive for shortness of breath (chronic and stable).   Gastrointestinal:  Negative for hematochezia and melena.  Genitourinary:  Negative for hematuria.  Neurological:  Positive for dizziness (during a fib episode) and light-headedness.  Psychiatric/Behavioral:  Positive for depression.    Objective  There were no vitals taken for this visit.  Vitals with BMI 10/23/2020 10/02/2020 09/30/2020  Height _0  _1  -  Weight 181 lbs 10 oz 184 lbs 13 oz -  BMI 81.15 72.62 -  Systolic 035 98 597  Diastolic 75 56 67  Pulse 66 70 65      Physical Exam Vitals reviewed.  Constitutional:      Appearance: She is not ill-appearing.  Neck:     Vascular: No carotid bruit.  Cardiovascular:     Rate and Rhythm: Normal rate and regular rhythm.     Pulses: Intact distal pulses.          Carotid pulses are 2+ on the right side and 2+ on the left side.      Radial pulses are 2+ on the right side and 2+ on the left side.       Popliteal pulses are 2+ on the right side and 2+ on the left side.       Dorsalis pedis pulses are 1+ on the right side and 0 on the left side.       Posterior tibial pulses are 1+ on the right side and 1+ on the left side.     Heart sounds: S1 normal and S2 normal. No murmur heard.   No gallop.  Pulmonary:     Effort: Pulmonary effort is normal. No respiratory distress.     Breath  sounds: Wheezing (worse on the left) present. No rhonchi or rales.  Musculoskeletal:     Right lower leg: No edema.     Left lower leg: No edema.  Neurological:     Mental Status: She is alert.    Laboratory examination:   Recent Labs    09/28/20 0824 09/29/20 0148 09/30/20 0126 10/21/20 1040  NA 140 138 140 149*  K 3.6 3.4* 3.5 4.0  CL 109 107 111 103  CO2 _2 GLUCOSE 177* 147* 183* 112*  BUN _3 CREATININE 0.72 0.68 0.72 0.89  CALCIUM 8.5* 8.5* 8.5* 9.3  GFRNONAA >60 >60 >60  --  estimated creatinine clearance is 69.8 mL/min (by C-G formula based on SCr of 0.89 mg/dL).  CMP Latest Ref Rng & Units 10/21/2020 09/30/2020 09/29/2020  Glucose 65 - 99 mg/dL 112(H) 183(H) 147(H)  BUN 8 - 27 mg/dL _0 Creatinine 0.57 - 1.00 mg/dL 0.89 0.72 0.68  Sodium 134 - 144 mmol/L 149(H) 140 138  Potassium 3.5 - 5.2 mmol/L 4.0 3.5 3.4(L)  Chloride 96 - 106 mmol/L 103 111 107  CO2 20 - 29 mmol/L _1 Calcium 8.7 - 10.3 mg/dL 9.3 8.5(L) 8.5(L)  Total Protein 6.0 - 8.5 g/dL 7.2 - -  Total Bilirubin 0.0 - 1.2 mg/dL 0.4 - -  Alkaline Phos 44 - 121 IU/L 87 - -  AST 0 - 40 IU/L 12 - -  ALT 0 - 32 IU/L 8 - -   CBC Latest Ref Rng & Units 09/30/2020 09/29/2020 09/28/2020  WBC 4.0 - 10.5 K/uL 16.9(H) 16.4(H) 19.3(H)  Hemoglobin 12.0 - 15.0 g/dL 11.9(L) 11.5(L) 11.9(L)  Hematocrit 36.0 - 46.0 % 36.4 35.4(L) 36.7  Platelets 150 - 400 K/uL 379 352 401(H)    Lipid Panel Recent Labs    09/21/20 0659  CHOL 146  TRIG 104  LDLCALC 101*  VLDL 21  HDL 24*  CHOLHDL 6.1    HEMOGLOBIN A1C Lab Results  Component Value Date   HGBA1C 7.4 (H) 09/21/2020   MPG 166 09/21/2020   TSH Recent Labs    10/21/20 1040  TSH 5.740*     External labs:   10/22/2020:  WBC 15.8, HGB 13.6, HCT 40.9, MCV 86.8, Platelets 315 Na 143, K 2.9, Cl 105, Magnesium 1.9, Creatinine 0.81, eGFR 83 PT 10.9, INR 0.87 Pro-BNP 3,700 Benzodiazepines - positive  Allergies   Allergies   Allergen Reactions   Hydrocodone Nausea And Vomiting   Hydrocodone Nausea And Vomiting   Other Nausea And Vomiting and Other (See Comments)    States all pain medications make her very ill- vomits   Tyloxapol Nausea And Vomiting   Oxycodone-Acetaminophen Nausea And Vomiting   Prednisone Rash and Other (See Comments)    Chest pain and a short-lived rash on the neck      Medications Prior to Visit:   Outpatient Medications Prior to Visit  Medication Sig Dispense Refill   amiodarone (PACERONE) 100 MG tablet Take 1 tablet (100 mg total) by mouth daily. (Patient taking differently: Take 200 mg by mouth daily.) 30 tablet 3   apixaban (ELIQUIS) 5 MG TABS tablet Take 1 tablet (5 mg total) by mouth 2 (two) times daily. 60 tablet 3   Artificial Tear Ointment (DRY EYES OP) Apply 1 drop to eye daily as needed (dry eyes).     aspirin 81 MG chewable tablet Chew 1 tablet (81 mg total) by mouth daily. 30 tablet 0   atorvastatin (LIPITOR) 80 MG tablet Take 1 tablet (80 mg total) by mouth daily at 8 pm. 30 tablet 3   clopidogrel (PLAVIX) 75 MG tablet Take 1 tablet (75 mg total) by mouth daily. 30 tablet 3   furosemide (LASIX) 20 MG tablet Take 1 tablet (20 mg total) by mouth as needed for fluid or edema. 30 tablet 3   losartan (COZAAR) 25 MG tablet Take 0.5 tablets (12.5 mg total) by mouth daily. 15 tablet 3   LUMIFY 0.025 % SOLN Place 1 drop into both eyes daily as needed (for irritation).     metFORMIN (GLUCOPHAGE) 500 MG tablet Take 500-1,000 mg by mouth See  admin instructions. 1000 mg in the morning 500 mg in the evening     metoprolol succinate (TOPROL-XL) 25 MG 24 hr tablet Take 1 tablet (25 mg total) by mouth daily. (Patient taking differently: Take 12.5 mg by mouth daily.) 30 tablet 3   potassium chloride (KLOR-CON) 10 MEQ tablet Take by mouth. (Patient not taking: Reported on 11/04/2020)     No facility-administered medications prior to visit.     Final Medications at End of Visit     Current Meds  Medication Sig   amiodarone (PACERONE) 100 MG tablet Take 1 tablet (100 mg total) by mouth daily. (Patient taking differently: Take 200 mg by mouth daily.)   apixaban (ELIQUIS) 5 MG TABS tablet Take 1 tablet (5 mg total) by mouth 2 (two) times daily.   Artificial Tear Ointment (DRY EYES OP) Apply 1 drop to eye daily as needed (dry eyes).   aspirin 81 MG chewable tablet Chew 1 tablet (81 mg total) by mouth daily.   atorvastatin (LIPITOR) 80 MG tablet Take 1 tablet (80 mg total) by mouth daily at 8 pm.   clopidogrel (PLAVIX) 75 MG tablet Take 1 tablet (75 mg total) by mouth daily.   furosemide (LASIX) 20 MG tablet Take 1 tablet (20 mg total) by mouth as needed for fluid or edema.   losartan (COZAAR) 25 MG tablet Take 0.5 tablets (12.5 mg total) by mouth daily.   LUMIFY 0.025 % SOLN Place 1 drop into both eyes daily as needed (for irritation).   metFORMIN (GLUCOPHAGE) 500 MG tablet Take 500-1,000 mg by mouth See admin instructions. 1000 mg in the morning 500 mg in the evening   metoprolol succinate (TOPROL-XL) 25 MG 24 hr tablet Take 1 tablet (25 mg total) by mouth daily. (Patient taking differently: Take 12.5 mg by mouth daily.)   Radiology:   CXR on 10/22/2020:  New hazy density at the right base which could be pleural fluid or parenchymal. In the setting of chest pain, pulmonary infarction should be considered.  Cardiac Studies:   Coronary arteriogram 09/21/2020: LV hemodynamics: 121/18, EDP 21 mmHg.  Ao 126/74, mean 97 mmHg.  No pressure gradient across the aortic valve. LV: EF 30 to 35% with mid to distal anterior, anteroapical, apical and inferior apical akinesis to dyskinesis. LM: Large-caliber vessel.  Smooth and normal. LAD: Large vessel, just immediately after the first septal perforator, the LAD is flush occluded.  Small diagonal arises at this segment.  The LAD otherwise is a large wraparound LAD and supplies large part of the apical and inferoapical region.   There are collaterals to the right coronary artery. CX: Moderate to large vessel, appears smooth and normal. RCA: Severely diseased diffusely, mid segment appears subtotally to totally occluded with TIMI I flow.  Distal bed of the RCA could not be seen due to minimal flow.  There are ipsilateral and contralateral collaterals to the distal right.   Intervention: Successful stenting of the proximal and mid segment of the LAD with a long 3.0 x 24 mm Synergy XD DES following aspiration thrombectomy, stent deployed at a peak pressure of 18 atmospheric pressure, stenosis reduced from 100% to 0% with stepup and stepdown and TIMI 0 to TIMI-3 flow at the end of the procedure. I did attempt to cross the RCA however extremely difficult and appeared to behave like a CTO, lesion left alone.  Patient was completely asymptomatic throughout the procedure. 135 mill contrast utilized.    Echocardiogram 09/21/2020:  1. Left ventricular ejection  fraction, by estimation, is 30 to 35%. The  left ventricle has moderately decreased function. The left ventricle  demonstrates regional wall motion abnormalities (see scoring  diagram/findings for description). Left ventricular   diastolic parameters are consistent with Grade II diastolic dysfunction  (pseudonormalization). Elevated left ventricular end-diastolic pressure.  The E/e' is 44. There is akinesis of the left ventricular, mid-apical  anterior wall, apical segment,  anteroseptal wall, inferoseptal wall and anterolateral wall. There is  akinesis of the left ventricular, apical inferior wall and apical segment.   2. Right ventricular systolic function is normal. The right ventricular  size is normal.   3. The mitral valve is normal in structure. No evidence of mitral valve  regurgitation. No evidence of mitral stenosis.   4. The aortic valve is tricuspid. Aortic valve regurgitation is not  visualized. Mild aortic valve sclerosis is present, with no evidence of   aortic valve stenosis.   5. The inferior vena cava is dilated in size with <50% respiratory  variability, suggesting right atrial pressure of 15 mmHg.  EKG:   10/23/2020: Normal sinus rhythm at a rate of 63 bpm. Right axis deviation. Poor R wave progression. Possible anteroseptal infarct, old. Cannot rule out ischemia. Stable compared to 10/02/2020.   10/02/2020: Sinus rhythm at a rate of 65 bpm.  Right axis Anterolateral infarct, age indeterminate.  Low voltage complexes, consider pulmonary disease pattern.  09/28/2020: Afib w/RVR 199 bpm Anterolateral infarct, age indeterminate  09/21/2020: Normal sinus rhythm, normal axis, diffuse anterolateral and inferior STEMI.  Low-voltage complexes.  Assessment     ICD-10-CM   1. Paroxysmal atrial fibrillation (HCC)  I48.0 EKG 12-Lead       There are no discontinued medications.   No orders of the defined types were placed in this encounter.  This patients CHA2DS2-VASc Score 4 (MI, F, stroke)and yearly risk of stroke 4.8%.   Recommendations:   KRISTIN BARCUS is a 61 y.o. female  patient with hyperlipidemia, type 2 DM, h/o stroke (vertebral artery dissection 2018), STEMI (09/21/2020) treated with PCI to prox LAD who presented again 09/28/20 with lightheadedness and concern that her heart rate was elevated 180 bpm, found to be in atrial fibrillation with rapid ventricular response.  Echocardiogram revealed moderately reduced LVEF at 30-35% and grade 2 diastolic dysfunction with left ventricular regional wall motion abnormalities.  EKG today reveals that the patient is in normal sinus rhythm with no acute changes when compared to the last EKG. Patient's spell of A fib yesterday was likely due to her low potasium. The patient should continue taking her potassium supplement as prescribed by the ED. Will continue to monitor the patient's A fib.   The patient has been taking too much amiodarone despite being instructed to decrease her dose at prior  visit. Labs on 10/21/2020 revealed that the patient's TSH was elevated at 5.740, likely due to thyroid dysfunction caused by the amiodarone. Patient was instructed today to only take 200 mg of Amiodarone daily. Will also refer to EP for further management of a fib and consideration for ablation.   Patient is presently maintaining sinus rhythm and is tolerating anticoagulation without bleeding diathesis.  We will continue other guideline directed medical therapy for CAD as well as treatment ofAtrial fibrillation including Eliquis, atorvastatin, clopidogrel, aspirin metoprolol.   As patient is presently being treated with amiodarone, they will need annual monitoring of PFTs, thyroid function, liver function, and ophthalmologic exam. Patient is aware of these monitoring parameters and agrees.  Due to the CXR findings and elevated pro-BNP in the ED, the patient was instructed to take her furosemide for the next 2 days to clear up excess fluid. Will reassess volume status at next office visit.   Patient should continue taking 12.5 mg of Losartan for the benefits to her heart despite some lower blood pressure readings. Her blood pressure was normal in the office today at 128/75. The patient should continue to monitor her BP at home. If the patient begins to experience dizziness or syncope, she should let the office know.   Patient was found to be positive for benzodiazepines in the ED despite the patient not having any Benzodiazepines prescribed to her. Patient was encouraged to establish with a PCP. Patient was encouraged to discuss her anxiety with a PCP so that she can have an appropriate and safe medication prescribed to her for her mental health. Patient voiced understanding about safety concerns and stated that she will find a PCP.   Patient endorsed snoring at her last office visit. The patient should continue to pursue getting a sleep study to evaluate for OSA.  The patient should continue working  towards smoking cessation.   Will continue to follow patient closely, follow-up in 2 weeks, sooner if needed for CAD and A. fib.   Patient was seen in collaboration with [employer] PA student Adline Potter who acted as Education administrator and participated in writing the above note. I personally reviewed patient records, interviewed patient, and examined patient. I agree with above assessment and plan. I also discussed case with Dr. Virgina Jock who is in agreement with the plan.    Alethia Berthold, PA-C 11/04/2020, 9:14 AM Office: [phone]

## 2020-11-11 ENCOUNTER — Encounter: Payer: Self-pay | Admitting: Student

## 2020-11-16 ENCOUNTER — Telehealth: Payer: Self-pay | Admitting: Student

## 2020-11-16 DIAGNOSIS — R0602 Shortness of breath: Secondary | ICD-10-CM

## 2020-11-16 NOTE — Telephone Encounter (Signed)
ON-CALL CARDIOLOGY 11/11/2020 10:19 PM  Patient's name: Alexandra Ward.   MRN: [id number].    DOB: 05-Jan-1961 Primary care provider: Kirstie Peri, MD. Primary cardiologist: Elvin So, PA-C  Interaction regarding this patient's care today: Patient called with concerns of shortness of breath and orthopnea over the last 3 days.  Denies chest pain, palpitations, dizziness, syncope, near syncope.  Impression:   ICD-10-CM   1. Shortness of breath  R06.02       No orders of the defined types were placed in this encounter.   No orders of the defined types were placed in this encounter.   Recommendations: Advised patient to take Lasix 20 mg once daily for the next 3 days and notify our office if symptoms worsen or fail to improve.  She verbalized understanding agreement.  Telephone encounter total time: 6 minutes    Rayford Halsted, PA-C 11/16/2020, 9:55 AM Office: [phone]

## 2020-12-03 ENCOUNTER — Institutional Professional Consult (permissible substitution): Payer: Self-pay | Admitting: Cardiology

## 2021-01-14 ENCOUNTER — Encounter: Payer: Self-pay | Admitting: Cardiology

## 2021-01-14 ENCOUNTER — Ambulatory Visit (INDEPENDENT_AMBULATORY_CARE_PROVIDER_SITE_OTHER): Payer: Self-pay | Admitting: Cardiology

## 2021-01-14 ENCOUNTER — Other Ambulatory Visit: Payer: Self-pay

## 2021-01-14 VITALS — BP 116/64 | HR 58 | Ht 64.0 in | Wt 169.8 lb

## 2021-01-14 DIAGNOSIS — I4819 Other persistent atrial fibrillation: Secondary | ICD-10-CM

## 2021-01-14 MED ORDER — CARVEDILOL 6.25 MG PO TABS: 6.2500 mg | ORAL_TABLET | Freq: Two times a day (BID) | ORAL | 3 refills | Status: DC

## 2021-01-14 NOTE — Patient Instructions (Addendum)
Medication Instructions:  Your physician has recommended you make the following change in your medication: STOP Metoprolol Succinate (Toprol) START Carvedilol 6.25 mg twice daily  *If you need a refill on your cardiac medications before your next appointment, please call your pharmacy*   Lab Work: None ordered   Testing/Procedures: None ordered   Follow-Up: At [facility], you and your health needs are our priority.  As part of our continuing mission to provide you with exceptional heart care, we have created designated Provider Care Teams.  These Care Teams include your primary Cardiologist (physician) and Advanced Practice Providers (APPs -  Physician Assistants and Nurse Practitioners) who all work together to provide you with the care you need, when you need it.  We recommend signing up for the patient portal called "MyChart".  Sign up information is provided on this After Visit Summary.  MyChart is used to connect with patients for Virtual Visits (Telemedicine).  Patients are able to view lab/test results, encounter notes, upcoming appointments, etc.  Non-urgent messages can be sent to your provider as well.   To learn more about what you can do with MyChart, go to [email or web address].    Your next appointment:   to be determined  The format for your next appointment:   In Person  Provider:   Loman Brooklyn, MD    Thank you for choosing [facility]!!   Dory Horn, RN [phone]   Other Instructions  Please call the office next week and let us know your decision after speaking with your family   Carvedilol Tablets What is this medication? CARVEDILOL (KAR ve dil ol) treats high blood pressure and heart failure. It may also be used to prevent further damage after a heart attack. It works by lowering your blood pressure and heart rate, making it easier for your heart to pump blood to the rest of your body. It belongs to a group of medications called beta  blockers. This medicine may be used for other purposes; ask your health care provider or pharmacist if you have questions. COMMON BRAND NAME(S): Coreg What should I tell my care team before I take this medication? They need to know if you have any of these conditions: Circulation problems Diabetes History of heart attack or heart disease Liver disease Lung or breathing disease, like asthma Pheochromocytoma Slow or irregular heartbeat Thyroid disease An unusual or allergic reaction to carvedilol, other beta blockers, medications, foods, dyes, or preservatives Pregnant or trying to get pregnant Breast-feeding How should I use this medication? Take this medication by mouth. Take it as directed on the prescription label at the same time every day. Take it with food. Keep taking it unless your care team tells you to stop. Talk to your care team about the use of this medication in children. Special care may be needed. Overdosage: If you think you have taken too much of this medicine contact a poison control center or emergency room at once. NOTE: This medicine is only for you. Do not share this medicine with others. What if I miss a dose? If you miss a dose, take it as soon as you can. If it is almost time for your next dose, take only that dose. Do not take double or extra doses. What may interact with this medication? This medication may interact with the following: Certain medications for blood pressure, heart disease, irregular heartbeat Certain medications for depression, like fluoxetine or paroxetine Certain medications for diabetes, like glipizide or glyburide Cimetidine Clonidine  Cyclosporine Digoxin MAOIs like Carbex, Eldepryl, Marplan, Nardil, and Parnate Reserpine Rifampin This list may not describe all possible interactions. Give your health care provider a list of all the medicines, herbs, non-prescription drugs, or dietary supplements you use. Also tell them if you smoke,  drink alcohol, or use illegal drugs. Some items may interact with your medicine. What should I watch for while using this medication? Visit your care team for regular checks on your progress. Check your blood pressure as directed. Ask your care team what your blood pressure should be. Also, find out when you should contact them. Do not treat yourself for coughs, colds, or pain while you are using this medication without asking your care team for advice. Some medications may increase your blood pressure. You may get drowsy or dizzy. Do not drive, use machinery, or do anything that needs mental alertness until you know how this medication affects you. Do not stand up or sit up quickly, especially if you are an older patient. This reduces the risk of dizzy or fainting spells. Alcohol may interfere with the effect of this medication. Avoid alcoholic drinks. This medication may increase blood sugar. Ask your care team if changes in diet or medications are needed if you have diabetes. If you are going to need surgery or another procedure, tell your care team that you are using this medication. What side effects may I notice from receiving this medication? Side effects that you should report to your care team as soon as possible: Allergic reactions-skin rash, itching, hives, swelling of the face, lips, tongue, or throat Heart failure-shortness of breath, swelling of the ankles, feet, or hands, sudden weight gain, unusual weakness or fatigue Low blood pressure-dizziness, feeling faint or lightheaded, blurry vision Raynaud's-cool, numb, or painful fingers or toes that may change color from pale, to blue, to red Slow heartbeat-dizziness, feeling faint or lightheaded, confusion, trouble breathing, unusual weakness or fatigue Worsening mood, feelings of depression Side effects that usually do not require medical attention (report to your care team if they continue or are bothersome): Change in sex drive or  performance Diarrhea Dizziness Fatigue Headache This list may not describe all possible side effects. Call your doctor for medical advice about side effects. You may report side effects to FDA at 1-800-FDA-1088. Where should I keep my medication? Keep out of the reach of children and pets. Store at room temperature between 20 and 25 degrees C (68 and 77 degrees F). Protect from moisture. Keep the container tightly closed. Throw away any unused medication after the expiration date. NOTE: This sheet is a summary. It may not cover all possible information. If you have questions about this medicine, talk to your doctor, pharmacist, or health care provider.  2022 Elsevier/Gold Standard (2020-04-23 11:31:41)     Cardiac Ablation Cardiac ablation is a procedure to destroy (ablate) some heart tissue that is sending bad signals. These bad signals cause problems in heart rhythm. The heart has many areas that make these signals. If there are problems in these areas, they can make the heart beat in a way that is not normal. Destroying some tissues can help make the heart rhythm normal. Tell your doctor about: Any allergies you have. All medicines you are taking. These include vitamins, herbs, eye drops, creams, and over-the-counter medicines. Any problems you or family members have had with medicines that make you fall asleep (anesthetics). Any blood disorders you have. Any surgeries you have had. Any medical conditions you have, such as kidney  failure. Whether you are pregnant or may be pregnant. What are the risks? This is a safe procedure. But problems may occur, including: Infection. Bruising and bleeding. Bleeding into the chest. Stroke or blood clots. Damage to nearby areas of your body. Allergies to medicines or dyes. The need for a pacemaker if the normal system is damaged. Failure of the procedure to treat the problem. What happens before the procedure? Medicines Ask your doctor  about: Changing or stopping your normal medicines. This is important. Taking aspirin and ibuprofen. Do not take these medicines unless your doctor tells you to take them. Taking other medicines, vitamins, herbs, and supplements. General instructions Follow instructions from your doctor about what you cannot eat or drink. Plan to have someone take you home from the hospital or clinic. If you will be going home right after the procedure, plan to have someone with you for 24 hours. Ask your doctor what steps will be taken to prevent infection. What happens during the procedure?  An IV tube will be put into one of your veins. You will be given a medicine to help you relax. The skin on your neck or groin will be numbed. A cut (incision) will be made in your neck or groin. A needle will be put through your cut and into a large vein. A tube (catheter) will be put into the needle. The tube will be moved to your heart. Dye may be put through the tube. This helps your doctor see your heart. Small devices (electrodes) on the tube will send out signals. A type of energy will be used to destroy some heart tissue. The tube will be taken out. Pressure will be held on your cut. This helps stop bleeding. A bandage will be put over your cut. The exact procedure may vary among doctors and hospitals. What happens after the procedure? You will be watched until you leave the hospital or clinic. This includes checking your heart rate, breathing rate, oxygen, and blood pressure. Your cut will be watched for bleeding. You will need to lie still for a few hours. Do not drive for 24 hours or as long as your doctor tells you. Summary Cardiac ablation is a procedure to destroy some heart tissue. This is done to treat heart rhythm problems. Tell your doctor about any medical conditions you may have. Tell him or her about all medicines you are taking to treat them. This is a safe procedure. But problems may occur.  These include infection, bruising, bleeding, and damage to nearby areas of your body. Follow what your doctor tells you about food and drink. You may also be told to change or stop some of your medicines. After the procedure, do not drive for 24 hours or as long as your doctor tells you. This information is not intended to replace advice given to you by your health care provider. Make sure you discuss any questions you have with your health care provider. Document Revised: 02/21/2019 Document Reviewed: 02/21/2019 Elsevier Patient Education  2022 [employer].

## 2021-01-14 NOTE — Progress Notes (Signed)
Electrophysiology Office Note   Date:  01/14/2021   ID:  Alexandra Ward, DOB 1961/03/13, MRN [id number]  PCP:  Kirstie Peri, MD  Cardiologist:  Jacinto Halim Primary Electrophysiologist:  Crimson Dubberly Jorja Loa, MD    Chief Complaint: AF   History of Present Illness: Alexandra Ward is a 60 y.o. female who is being seen today for the evaluation of AF at the request of Elvin So C, PA*. Presenting today for electrophysiology evaluation.  She has a history significant for hyperlipidemia, type 2 diabetes, stroke with vertebral artery dissection in 2018, STEMI 09/21/2020 treated with PCI to the LAD, ongoing tobacco abuse.  She presented to the hospital 09/28/2020 with atrial fibrillation and rapid rates.  She was started on amiodarone and Eliquis and was discharged.  She has since had 2 emergency room visits with atrial fibrillation and rapid rates.  Unfortunately her TSH is started to go up.  Today, she denies symptoms of palpitations, chest pain, shortness of breath, orthopnea, PND, lower extremity edema, claudication, dizziness, presyncope, syncope, bleeding, or neurologic sequela. The patient is tolerating medications without difficulties.  Since being fully loaded on amiodarone, she has had no further episodes of atrial fibrillation.  She is overall comfortable with her control.  She is concerned about her thyroid.  When she is in atrial fibrillation she feels quite poorly.  She is happy to be in normal rhythm.   Past Medical History:  Diagnosis Date   Diabetes mellitus without complication (HCC)    Hyperlipidemia    Stroke [facility])    Vertebral artery dissection 2018   Past Surgical History:  Procedure Laterality Date   CORONARY THROMBECTOMY N/A 09/21/2020   Procedure: Coronary Thrombectomy;  Surgeon: Yates Decamp, MD;  Location: [facility] INVASIVE CV LAB;  Service: Cardiovascular;  Laterality: N/A;   CORONARY/GRAFT ACUTE MI REVASCULARIZATION N/A 09/21/2020   Procedure: Coronary/Graft Acute MI  Revascularization;  Surgeon: Yates Decamp, MD;  Location: [facility] INVASIVE CV LAB;  Service: Cardiovascular;  Laterality: N/A;   LEFT HEART CATH AND CORONARY ANGIOGRAPHY N/A 09/21/2020   Procedure: LEFT HEART CATH AND CORONARY ANGIOGRAPHY;  Surgeon: Yates Decamp, MD;  Location: [facility] INVASIVE CV LAB;  Service: Cardiovascular;  Laterality: N/A;     Current Outpatient Medications  Medication Sig Dispense Refill   apixaban (ELIQUIS) 5 MG TABS tablet Take 1 tablet (5 mg total) by mouth 2 (two) times daily. 60 tablet 3   Artificial Tear Ointment (DRY EYES OP) Apply 1 drop to eye daily as needed (dry eyes).     aspirin 81 MG chewable tablet Chew 1 tablet (81 mg total) by mouth daily. 30 tablet 0   atorvastatin (LIPITOR) 80 MG tablet Take 1 tablet (80 mg total) by mouth daily at 8 pm. 30 tablet 3   carvedilol (COREG) 6.25 MG tablet Take 1 tablet (6.25 mg total) by mouth 2 (two) times daily. 60 tablet 3   clopidogrel (PLAVIX) 75 MG tablet Take 1 tablet (75 mg total) by mouth daily. 30 tablet 3   furosemide (LASIX) 20 MG tablet Take 1 tablet (20 mg total) by mouth as needed for fluid or edema. 30 tablet 3   losartan (COZAAR) 25 MG tablet Take 0.5 tablets (12.5 mg total) by mouth daily. 15 tablet 3   LUMIFY 0.025 % SOLN Place 1 drop into both eyes daily as needed (for irritation).     metFORMIN (GLUCOPHAGE) 500 MG tablet Take 500-1,000 mg by mouth See admin instructions. 1000 mg in the morning 500 mg in the  evening     amiodarone (PACERONE) 200 MG tablet Take by mouth.     potassium chloride (KLOR-CON) 10 MEQ tablet Take by mouth. (Patient not taking: Reported on 11/04/2020)     No current facility-administered medications for this visit.    Allergies:   Hydrocodone, Hydrocodone, Other, Tyloxapol, Oxycodone-acetaminophen, and Prednisone   Social History:  The patient  reports that she has been smoking cigarettes. She has never used smokeless tobacco. She reports that she does not drink alcohol and does not use  drugs.   Family History:  The patient's family history includes Heart disease in her mother.    ROS:  Please see the history of present illness.   Otherwise, review of systems is positive for none.   All other systems are reviewed and negative.    PHYSICAL EXAM: VS:  BP 116/64   Pulse (!) 58   Ht 5\' 4"  (1.626 m)   Wt 169 lb 12.8 oz (77 kg)   SpO2 94%   BMI 29.15 kg/m  , BMI Body mass index is 29.15 kg/m. GEN: Well nourished, well developed, in no acute distress  HEENT: normal  Neck: no JVD, carotid bruits, or masses Cardiac: RRR; no murmurs, rubs, or gallops,no edema  Respiratory:  clear to auscultation bilaterally, normal work of breathing GI: soft, nontender, nondistended, + BS MS: no deformity or atrophy  Skin: warm and dry Neuro:  Strength and sensation are intact Psych: euthymic mood, full affect  EKG:  EKG is ordered today. Personal review of the ekg ordered shows sinus rhythm, rate 58  Recent Labs: 09/30/2020: Hemoglobin 11.9; Magnesium 2.0; Platelets 379 10/21/2020: ALT 8; BUN 9; Creatinine, Ser 0.89; Potassium 4.0; Sodium 149; TSH 5.740    Lipid Panel     Component Value Date/Time   CHOL 146 09/21/2020 0659   TRIG 104 09/21/2020 0659   HDL 24 (L) 09/21/2020 0659   CHOLHDL 6.1 09/21/2020 0659   VLDL 21 09/21/2020 0659   LDLCALC 101 (H) 09/21/2020 0659     Wt Readings from Last 3 Encounters:  01/14/21 169 lb 12.8 oz (77 kg)  11/04/20 179 lb (81.2 kg)  10/23/20 181 lb 9.6 oz (82.4 kg)      Other studies Reviewed: Additional studies/ records that were reviewed today include: TTE 09/21/20  Review of the above records today demonstrates:   1. Left ventricular ejection fraction, by estimation, is 30 to 35%. The  left ventricle has moderately decreased function. The left ventricle  demonstrates regional wall motion abnormalities (see scoring  diagram/findings for description). Left ventricular   diastolic parameters are consistent with Grade II diastolic  dysfunction  (pseudonormalization). Elevated left ventricular end-diastolic pressure.  The E/e' is 22. There is akinesis of the left ventricular, mid-apical  anterior wall, apical segment,  anteroseptal wall, inferoseptal wall and anterolateral wall. There is  akinesis of the left ventricular, apical inferior wall and apical segment.   2. Right ventricular systolic function is normal. The right ventricular  size is normal.   3. The mitral valve is normal in structure. No evidence of mitral valve  regurgitation. No evidence of mitral stenosis.   4. The aortic valve is tricuspid. Aortic valve regurgitation is not  visualized. Mild aortic valve sclerosis is present, with no evidence of  aortic valve stenosis.   5. The inferior vena cava is dilated in size with <50% respiratory  variability, suggesting right atrial pressure of 15 mmHg.   LHC 09/21/20 LV hemodynamics: 121/18, EDP 21 mmHg.  Ao 126/74, mean 97 mmHg.  No pressure gradient across the aortic valve. LV: EF 30 to 35% with mid to distal anterior, anteroapical, apical and inferior apical akinesis to dyskinesis. LM: Large-caliber vessel.  Smooth and normal. LAD: Large vessel, just immediately after the first septal perforator, the LAD is flush occluded.  Small diagonal arises at this segment.  The LAD otherwise is a large wraparound LAD and supplies large part of the apical and inferoapical region.  There are collaterals to the right coronary artery. CX: Moderate to large vessel, appears smooth and normal. RCA: Severely diseased diffusely, mid segment appears subtotally to totally occluded with TIMI I flow.  Distal bed of the RCA could not be seen due to minimal flow.  There are ipsilateral and contralateral collaterals to the distal right.  Intervention: Successful stenting of the proximal and mid segment of the LAD with a long 3.0 x 24 mm Synergy XD DES following aspiration thrombectomy, stent deployed at a peak pressure of 18 atmospheric  pressure, stenosis reduced from 100% to 0% with stepup and stepdown and TIMI 0 to TIMI-3 flow at the end of the procedure. I did attempt to cross the RCA however extremely difficult and appeared to behave like a CTO, lesion left alone.  Patient was completely asymptomatic throughout the procedure.  ASSESSMENT AND PLAN:  1.  Persistent atrial fibrillation: Currently on amiodarone 100 mg daily, Eliquis 5 mg twice daily, Toprol-XL 25 mg daily.  Since being fully loaded on amiodarone, she has had no further episodes of atrial fibrillation.  Despite that, she is having thyroid issues.  She would likely benefit from ablation.  Risks and benefits were discussed.  She would like to discuss this further with her son, but I do feel that she Lequita Meadowcroft agree to ablation.  Risk, benefits, and alternatives to EP study and radiofrequency ablation for afib were also discussed in detail today. These risks include but are not limited to stroke, bleeding, vascular damage, tamponade, perforation, damage to the esophagus, lungs, and other structures, pulmonary vein stenosis, worsening renal function, and death.    2.  Coronary artery disease: No current chest pain.  Plan per primary cardiology.  3.  Chronic systolic heart failure due to ischemic cardiomyopathy: Currently on losartan 12.5 mg daily, Toprol-XL 25 mg daily.  Jaryan Chicoine need a repeat echo to evaluate for ICD implant for primary prevention.   Case discussed with primary cardiology  Current medicines are reviewed at length with the patient today.   The patient does not have concerns regarding her medicines.  The following changes were made today: Stop metoprolol, start carvedilol 6.25 mg twice daily  Labs/ tests ordered today include:  Orders Placed This Encounter  Procedures   EKG 12-Lead      Disposition:   FU with Meigan Pates 3 months  Signed, Jodeci Rini Jorja Loa, MD  01/14/2021 11:02 AM     [facility] [address] [place] [address] [phone] (office) [phone] (fax)

## 2021-01-21 ENCOUNTER — Telehealth: Payer: Self-pay | Admitting: Cardiology

## 2021-01-21 NOTE — Telephone Encounter (Signed)
Follow Up:      Patient is returning  Sherri's  call. 

## 2021-01-21 NOTE — Telephone Encounter (Signed)
Left message to call back  

## 2021-01-21 NOTE — Telephone Encounter (Signed)
Pt c/o medication issue:  1. Name of Medication:  carvedilol (COREG) 6.25 MG tablet  2. How are you currently taking this medication (dosage and times per day)?  As prescribed   3. Are you having a reaction (difficulty breathing--STAT)?  See below   4. What is your medication issue?  Patient states she has been SOB since 10/15 and she assumes this medication is causing it    Pt c/o Shortness Of Breath: STAT if SOB developed within the last 24 hours or pt is noticeably SOB on the phone  1. Are you currently SOB (can you hear that pt is SOB on the phone)?  No  2. How long have you been experiencing SOB?  Since 10/15  3. Are you SOB when sitting or when up moving around?  When up and moving around   4. Are you currently experiencing any other symptoms?  No

## 2021-01-21 NOTE — Telephone Encounter (Signed)
Pt reports SOB that started after switching to Carvedilol last week.  She did NOT have this prior to the medication change. The was switched d/t reported hair loss at 10/13 OV w/ Camnitz. Pt informed that Dr. Elberta Fortis is out of the office the remainder of this week.  Advised to stop the Carvedilol and restart the Toprol until can be reviewed by MD. Aware will discuss medications and hair loss issue with Dr. Elberta Fortis and let he know recommendation next week. Patient verbalized understanding and agreeable to plan.

## 2021-01-26 ENCOUNTER — Telehealth: Payer: Self-pay | Admitting: Student

## 2021-01-26 NOTE — Telephone Encounter (Signed)
Patient wants to discuss medication management and her hair - she did not get specific, this is the info she gave and wants to discuss with Park Meo if possible.

## 2021-01-27 NOTE — Telephone Encounter (Signed)
Spoke with patient regarding medication questions, she has been experiencing hair loss with beta-blocker therapy.  This is presently being managed by PCP, therefore advised her to follow-up with Dr. Elberta Fortis office for further recommendation.

## 2021-01-27 NOTE — Telephone Encounter (Signed)
Called patient, NA, LMAM

## 2021-02-04 ENCOUNTER — Other Ambulatory Visit: Payer: Self-pay | Admitting: Student

## 2021-02-09 NOTE — Progress Notes (Signed)
Primary Physician/Referring:  Monico Blitz, MD  Patient ID: Alexandra Ward, female    DOB: [birth year], 60 y.o.   MRN: [id number]  Chief Complaint  Patient presents with   Atrial Fibrillation   Coronary Artery Disease   Follow-up   hair loss   Shortness of Breath   HPI:    Alexandra Ward  is a 60 y.o.  female  patient with hyperlipidemia, ongoing tobacco use, type 2 DM, h/o stroke (vertebral artery dissection 2018), STEMI (09/21/2020) treated with PCI to prox LAD, presented again 09/28/2020 and atrial fibrillation with RVR and LVEF 30-35%.  He was started on amiodarone and Eliquis and discharged.  Patient presents for 10-monthfollow-up.  She was recently seen by Dr. CCurt Bearsto switch her from metoprolol to carvedilol and advised that she would be a good candidate for ablation, however patient has opted to hold off on proceeding with ablation until at least the first of next year given some insurance issues.  Patient plans to follow-up with Dr. CCurt Bearsin January 2023 for further management.  She is feeling well overall, her only specific complaint today is hair loss which she attributes to carvedilol.  Although her blood pressure is soft both in the office today and at home she is asymptomatic.  Denies dyspnea, dizziness, syncope, near syncope.  Denies chest pain, palpitations.  Patient continues to take Lasix as needed for shortness of breath and swelling.  She is taking medications as directed and tolerating current regimen without issue.  Past Medical History:  Diagnosis Date   [place]    Hyperlipidemia    Stroke ([facility]    Vertebral artery dissection 2018   Past Surgical History:  Procedure Laterality Date   CORONARY THROMBECTOMY N/A 09/21/2020   Procedure: Coronary Thrombectomy;  Surgeon: GAdrian Prows MD;  [facility];  Service: Cardiovascular;  Laterality: N/A;   CORONARY/GRAFT ACUTE MI REVASCULARIZATION N/A 09/21/2020    Procedure: Coronary/Graft Acute MI Revascularization;  Surgeon: GAdrian Prows MD;  [facility];  Service: Cardiovascular;  Laterality: N/A;   LEFT HEART CATH AND CORONARY ANGIOGRAPHY N/A 09/21/2020   Procedure: LEFT HEART CATH AND CORONARY ANGIOGRAPHY;  Surgeon: GAdrian Prows MD;  [facility];  Service: Cardiovascular;  Laterality: N/A;   Family History  Problem Relation Age of Onset   Heart disease Mother     Social History   Tobacco Use   Smoking status: Every Day    Types: Cigarettes   Smokeless tobacco: Never  Substance Use Topics   Alcohol use: No   Marital Status: Divorced   ROS  Review of Systems  Constitutional: Positive for malaise/fatigue (improving). Negative for weight gain.  Cardiovascular:  Positive for dyspnea on exertion (improving). Negative for chest pain, claudication, leg swelling, near-syncope, orthopnea, palpitations, paroxysmal nocturnal dyspnea and syncope.  Neurological:  Negative for dizziness.   Objective  Blood pressure 99/62, pulse (!) 55, temperature 97.8 F (36.6 C), height '5\' 4"'  (1.626 m), weight 171 lb (77.6 kg), SpO2 96 %.  Vitals with BMI 02/10/2021 01/14/2021 11/04/2020  Height '5\' 4"'  '5\' 4"'  '5\' 4"'   Weight 171 lbs 169 lbs 13 oz 179 lbs  BMI 29.34 [id number] Systolic 99 [id number] Diastolic 62 64 60  Pulse 55 58 61      Physical Exam Vitals reviewed.  Neck:     Vascular: No carotid bruit.  Cardiovascular:     Rate and Rhythm: Normal rate  and regular rhythm.     Pulses: Intact distal pulses.          Carotid pulses are 2+ on the right side and 2+ on the left side.      Radial pulses are 2+ on the right side and 2+ on the left side.       Popliteal pulses are 2+ on the right side and 2+ on the left side.       Dorsalis pedis pulses are 1+ on the right side and 0 on the left side.       Posterior tibial pulses are 1+ on the right side and 1+ on the left side.     Heart sounds: S1 normal and S2 normal. No murmur  heard.   No gallop.  Pulmonary:     Effort: Pulmonary effort is normal. No respiratory distress.     Breath sounds: No wheezing, rhonchi or rales.  Musculoskeletal:     Right lower leg: No edema.     Left lower leg: No edema.  Neurological:     Mental Status: She is alert.  Physical exam unchanged compared to previous.  Laboratory examination:   Recent Labs    09/28/20 0824 09/29/20 0148 09/30/20 0126 10/21/20 1040  NA 140 138 140 149*  K 3.6 3.4* 3.5 4.0  CL 109 107 111 103  CO2 '24 23 22 25  ' GLUCOSE 177* 147* 183* 112*  BUN '10 14 13 9  ' CREATININE 0.72 0.68 0.72 0.89  CALCIUM 8.5* 8.5* 8.5* 9.3  GFRNONAA >60 >60 >60  --    CrCl cannot be calculated (Patient's most recent lab result is older than the maximum 21 days allowed.).  CMP Latest Ref Rng & Units 10/21/2020 09/30/2020 09/29/2020  Glucose 65 - 99 mg/dL 112(H) 183(H) 147(H)  BUN 8 - 27 mg/dL '9 13 14  ' Creatinine 0.57 - 1.00 mg/dL 0.89 0.72 0.68  Sodium 134 - 144 mmol/L 149(H) 140 138  Potassium 3.5 - 5.2 mmol/L 4.0 3.5 3.4(L)  Chloride 96 - 106 mmol/L 103 111 107  CO2 20 - 29 mmol/L '25 22 23  ' Calcium 8.7 - 10.3 mg/dL 9.3 8.5(L) 8.5(L)  Total Protein 6.0 - 8.5 g/dL 7.2 - -  Total Bilirubin 0.0 - 1.2 mg/dL 0.4 - -  Alkaline Phos 44 - 121 IU/L 87 - -  AST 0 - 40 IU/L 12 - -  ALT 0 - 32 IU/L 8 - -   CBC Latest Ref Rng & Units 09/30/2020 09/29/2020 09/28/2020  WBC 4.0 - 10.5 K/uL 16.9(H) 16.4(H) 19.3(H)  Hemoglobin 12.0 - 15.0 g/dL 11.9(L) 11.5(L) 11.9(L)  Hematocrit 36.0 - 46.0 % 36.4 35.4(L) 36.7  Platelets 150 - 400 K/uL 379 352 401(H)    Lipid Panel Recent Labs    09/21/20 0659  CHOL 146  TRIG 104  LDLCALC 101*  VLDL 21  HDL 24*  CHOLHDL 6.1    HEMOGLOBIN A1C Lab Results  Component Value Date   HGBA1C 7.4 (H) 09/21/2020   MPG 166 09/21/2020   TSH Recent Labs    10/21/20 1040  TSH 5.740*    External labs:   None   Allergies   Allergies  Allergen Reactions   Hydrocodone Nausea And  Vomiting   Hydrocodone Nausea And Vomiting   Other Nausea And Vomiting and Other (See Comments)    States all pain medications make her very ill- vomits   Tyloxapol Nausea And Vomiting   Oxycodone-Acetaminophen Nausea And Vomiting   Prednisone Rash  and Other (See Comments)    Chest pain and a short-lived rash on the neck    Medications Prior to Visit:   Outpatient Medications Prior to Visit  Medication Sig Dispense Refill   amiodarone (PACERONE) 200 MG tablet Take by mouth.     apixaban (ELIQUIS) 5 MG TABS tablet Take 1 tablet (5 mg total) by mouth 2 (two) times daily. 60 tablet 3   Artificial Tear Ointment (DRY EYES OP) Apply 1 drop to eye daily as needed (dry eyes).     aspirin 81 MG chewable tablet Chew 1 tablet (81 mg total) by mouth daily. 30 tablet 0   atorvastatin (LIPITOR) 80 MG tablet Take 1 tablet (80 mg total) by mouth daily at 8 pm. 30 tablet 3   carvedilol (COREG) 6.25 MG tablet Take 1 tablet (6.25 mg total) by mouth 2 (two) times daily. 60 tablet 3   clopidogrel (PLAVIX) 75 MG tablet TAKE 1 TABLET BY MOUTH EVERY DAY 30 tablet 3   LUMIFY 0.025 % SOLN Place 1 drop into both eyes daily as needed (for irritation).     metFORMIN (GLUCOPHAGE) 500 MG tablet Take 500-1,000 mg by mouth See admin instructions. 1000 mg in the morning 500 mg in the evening     potassium chloride (KLOR-CON) 10 MEQ tablet Take by mouth.     losartan (COZAAR) 25 MG tablet Take 0.5 tablets (12.5 mg total) by mouth daily. 15 tablet 3   furosemide (LASIX) 20 MG tablet Take 1 tablet (20 mg total) by mouth as needed for fluid or edema. 30 tablet 3   No facility-administered medications prior to visit.   Final Medications at End of Visit    Current Meds  Medication Sig   amiodarone (PACERONE) 200 MG tablet Take by mouth.   apixaban (ELIQUIS) 5 MG TABS tablet Take 1 tablet (5 mg total) by mouth 2 (two) times daily.   Artificial Tear Ointment (DRY EYES OP) Apply 1 drop to eye daily as needed (dry eyes).    aspirin 81 MG chewable tablet Chew 1 tablet (81 mg total) by mouth daily.   atorvastatin (LIPITOR) 80 MG tablet Take 1 tablet (80 mg total) by mouth daily at 8 pm.   carvedilol (COREG) 6.25 MG tablet Take 1 tablet (6.25 mg total) by mouth 2 (two) times daily.   clopidogrel (PLAVIX) 75 MG tablet TAKE 1 TABLET BY MOUTH EVERY DAY   LUMIFY 0.025 % SOLN Place 1 drop into both eyes daily as needed (for irritation).   metFORMIN (GLUCOPHAGE) 500 MG tablet Take 500-1,000 mg by mouth See admin instructions. 1000 mg in the morning 500 mg in the evening   potassium chloride (KLOR-CON) 10 MEQ tablet Take by mouth.   [DISCONTINUED] losartan (COZAAR) 25 MG tablet Take 0.5 tablets (12.5 mg total) by mouth daily.   Radiology:   No results found.  Cardiac Studies:   Coronary arteriogram 09/21/2020: LV hemodynamics: 121/18, EDP 21 mmHg.  Ao 126/74, mean 97 mmHg.  No pressure gradient across the aortic valve. LV: EF 30 to 35% with mid to distal anterior, anteroapical, apical and inferior apical akinesis to dyskinesis. LM: Large-caliber vessel.  Smooth and normal. LAD: Large vessel, just immediately after the first septal perforator, the LAD is flush occluded.  Small diagonal arises at this segment.  The LAD otherwise is a large wraparound LAD and supplies large part of the apical and inferoapical region.  There are collaterals to the right coronary artery. CX: Moderate to large vessel, appears  smooth and normal. RCA: Severely diseased diffusely, mid segment appears subtotally to totally occluded with TIMI I flow.  Distal bed of the RCA could not be seen due to minimal flow.  There are ipsilateral and contralateral collaterals to the distal right.   Intervention: Successful stenting of the proximal and mid segment of the LAD with a long 3.0 x 24 mm Synergy XD DES following aspiration thrombectomy, stent deployed at a peak pressure of 18 atmospheric pressure, stenosis reduced from 100% to 0% with stepup and  stepdown and TIMI 0 to TIMI-3 flow at the end of the procedure. I did attempt to cross the RCA however extremely difficult and appeared to behave like a CTO, lesion left alone.  Patient was completely asymptomatic throughout the procedure. 135 mill contrast utilized.    Echocardiogram 09/21/2020:  1. Left ventricular ejection fraction, by estimation, is 30 to 35%. The  left ventricle has moderately decreased function. The left ventricle  demonstrates regional wall motion abnormalities (see scoring  diagram/findings for description). Left ventricular   diastolic parameters are consistent with Grade II diastolic dysfunction  (pseudonormalization). Elevated left ventricular end-diastolic pressure.  The E/e' is 19. There is akinesis of the left ventricular, mid-apical  anterior wall, apical segment,  anteroseptal wall, inferoseptal wall and anterolateral wall. There is  akinesis of the left ventricular, apical inferior wall and apical segment.   2. Right ventricular systolic function is normal. The right ventricular  size is normal.   3. The mitral valve is normal in structure. No evidence of mitral valve  regurgitation. No evidence of mitral stenosis.   4. The aortic valve is tricuspid. Aortic valve regurgitation is not  visualized. Mild aortic valve sclerosis is present, with no evidence of  aortic valve stenosis.   5. The inferior vena cava is dilated in size with <50% respiratory  variability, suggesting right atrial pressure of 15 mmHg.  EKG:   11/04/2020: Sinus rhythm at a rate of 63 bpm.  Right axis.  Anterolateral infarct, age undetermined.  Low voltage complexes.  10/02/2020: Sinus rhythm at a rate of 65 bpm.  Right axis Anterolateral infarct, age indeterminate.  Low voltage complexes, consider pulmonary disease pattern.  09/28/2020: Afib w/RVR 199 bpm Anterolateral infarct, age indeterminate  09/21/2020: Normal sinus rhythm, normal axis, diffuse anterolateral and inferior STEMI.   Low-voltage complexes.  Assessment     ICD-10-CM   1. Chronic combined systolic and diastolic heart failure (HCC)  I50.42 PCV ECHOCARDIOGRAM COMPLETE    2. Coronary artery disease involving native coronary artery of native heart without angina pectoris  I25.10     3. Paroxysmal atrial fibrillation (HCC)  I48.0     4. Long term current use of therapeutic drug  Z79.899 TSH    CMP14+EGFR       Medications Discontinued During This Encounter  Medication Reason   losartan (COZAAR) 25 MG tablet Reorder     Meds ordered this encounter  Medications   losartan (COZAAR) 25 MG tablet    Sig: Take 0.5 tablets (12.5 mg total) by mouth daily.    Dispense:  45 tablet    Refill:  3    This patients CHA2DS2-VASc Score 4 (MI, F, stroke)and yearly risk of stroke 4.8%.   Recommendations:   Alexandra Ward is a 60 y.o. female  patient with hyperlipidemia, ongoing tobacco use, type 2 DM, h/o stroke (vertebral artery dissection 2018), STEMI (09/21/2020) treated with PCI to prox LAD, presented again 09/28/2020 and atrial fibrillation with RVR and  LVEF 30-35%.  He was started on amiodarone and Eliquis and discharged.  Patient presents for 22-monthoffice visit.  Her primary concern today is hair thinning which she feels is related to carvedilol.  Discussed with patient risks versus benefits of changing beta-blocker therapy, patient opted to continue with carvedilol as she is tolerating this otherwise.  Patient has had no known recurrence of atrial fibrillation since last office visit.  She plans to follow-up with Dr. CCurt Bearsfor further evaluation and reconsideration of ablation in January.  She continues to tolerate anticoagulation without bleeding diathesis, will continue this.  Given that she is on amiodarone Will obtain TSH and CMP at this time.  Although blood pressure is soft patient is asymptomatic, therefore we will not make changes to medications at this time.  However will hold off on transitioning  from losartan to [employer]or adding spironolactone given soft blood pressure.  Patient has not been on guideline directed medical therapy, will therefore repeat echocardiogram to reevaluate LVEF.  Patient may need consideration for ICD implantation for primary prevention.  Follow-up in 3 months, sooner if needed.   CAlethia Berthold PA-C 02/10/2021, 1:30 PM Office: [phone]

## 2021-02-10 ENCOUNTER — Other Ambulatory Visit: Payer: Self-pay

## 2021-02-10 ENCOUNTER — Encounter: Payer: Self-pay | Admitting: Student

## 2021-02-10 ENCOUNTER — Ambulatory Visit: Payer: Self-pay | Admitting: Student

## 2021-02-10 VITALS — BP 99/62 | HR 55 | Temp 97.8°F | Ht 64.0 in | Wt 171.0 lb

## 2021-02-10 DIAGNOSIS — Z79899 Other long term (current) drug therapy: Secondary | ICD-10-CM

## 2021-02-10 DIAGNOSIS — I2510 Atherosclerotic heart disease of native coronary artery without angina pectoris: Secondary | ICD-10-CM

## 2021-02-10 DIAGNOSIS — I480 Paroxysmal atrial fibrillation: Secondary | ICD-10-CM

## 2021-02-10 DIAGNOSIS — I5042 Chronic combined systolic (congestive) and diastolic (congestive) heart failure: Secondary | ICD-10-CM

## 2021-02-10 MED ORDER — LOSARTAN POTASSIUM 25 MG PO TABS: 12.5000 mg | ORAL_TABLET | Freq: Every day | ORAL | 3 refills | Status: DC

## 2021-02-11 NOTE — Telephone Encounter (Signed)
Followed up with pt. She reports that she never stopped the Carvedilol and will remain on it for now being that concerns she has seemed to have resolved. She will call back if concerns arise. She appreciates my follow up

## 2021-02-17 ENCOUNTER — Other Ambulatory Visit: Payer: Self-pay

## 2021-03-03 ENCOUNTER — Other Ambulatory Visit: Payer: Self-pay

## 2021-03-10 ENCOUNTER — Encounter: Payer: Self-pay | Admitting: Cardiology

## 2021-03-10 ENCOUNTER — Other Ambulatory Visit: Payer: Self-pay

## 2021-03-10 ENCOUNTER — Ambulatory Visit: Payer: Self-pay

## 2021-03-10 DIAGNOSIS — I5042 Chronic combined systolic (congestive) and diastolic (congestive) heart failure: Secondary | ICD-10-CM

## 2021-03-22 NOTE — Progress Notes (Signed)
Reviewed results with patient. Will discuss further at upcoming OV.

## 2021-03-24 ENCOUNTER — Other Ambulatory Visit: Payer: Self-pay | Admitting: Student

## 2021-05-01 ENCOUNTER — Other Ambulatory Visit: Payer: Self-pay | Admitting: Cardiology

## 2021-05-14 ENCOUNTER — Ambulatory Visit: Payer: Medicaid Other | Admitting: Student

## 2021-05-19 ENCOUNTER — Ambulatory Visit: Payer: Medicaid Other | Admitting: Student

## 2021-06-06 ENCOUNTER — Other Ambulatory Visit: Payer: Self-pay | Admitting: Student

## 2021-07-06 ENCOUNTER — Telehealth: Payer: Self-pay | Admitting: Cardiology

## 2021-07-06 NOTE — Telephone Encounter (Signed)
Called and let pt know that Alexandra Ward is out of office and will be back Thursday and we will send the message on to her for review at that time. ?

## 2021-07-06 NOTE — Telephone Encounter (Signed)
Patient is calling to speak with Sherri, RN in regards to discussing following through with the ablation they had previously discussed.  ?

## 2021-07-09 NOTE — Telephone Encounter (Signed)
Left detailed message that I would be in touch to schedule procedure. ?

## 2021-07-22 ENCOUNTER — Other Ambulatory Visit: Payer: Self-pay | Admitting: Student

## 2021-08-17 ENCOUNTER — Telehealth: Payer: Self-pay

## 2021-08-17 NOTE — Telephone Encounter (Signed)
I called pt to schedule her Afib Ablation. She stated that she would like to get this scheduled ASAP so she could start feeling better but she currently does not have any insurance and she was denied Medicaid.     ? ?I have mailed her the 2 applications we have: ?1) [employer] Asst[employer] Application ?2) [employer] Application ? ?She will get these completed and sent in as soon as she can. ?

## 2021-08-20 ENCOUNTER — Telehealth (HOSPITAL_COMMUNITY): Payer: Self-pay | Admitting: Licensed Clinical Social Worker

## 2021-08-20 NOTE — Telephone Encounter (Signed)
CSW contacted patient to follow up on [employer] application sent to patient from office. Patient states she has not received it yet but will check the mail. CSW reviewed process and documents needed to support application. CSW also informed patient that second application provided for [facility] is only for [employer] and she would not apply as she resides in [place]. Patient verbalizes understanding and will contact CSW if further needs arise. Lasandra Beech, LCSW, CCSW-MCS [phone]

## 2021-08-25 ENCOUNTER — Ambulatory Visit: Payer: Medicaid Other | Admitting: Student

## 2021-08-25 ENCOUNTER — Other Ambulatory Visit: Payer: Self-pay | Admitting: Cardiology

## 2021-08-31 NOTE — Progress Notes (Unsigned)
Primary Physician/Referring:  Kirstie Peri, MD  Patient ID: Alexandra Ward, female    DOB: [birth year], 61 y.o.   MRN: [id number]  No chief complaint on file.  HPI:    Alexandra Ward  is a 61 y.o.  female  patient with hyperlipidemia, ongoing tobacco use, type 2 DM, h/o stroke (vertebral artery dissection 2018), STEMI (09/21/2020) treated with PCI to prox LAD, presented again 09/28/2020 and atrial fibrillation with RVR and LVEF 30-35%.  He was started on amiodarone and Eliquis and discharged.  Patient was last seen in our office 02/10/2021 at which time she was advised to follow-up in 3 months, unfortunately she was lost to follow-up until now. ***  ***Admission in 03/2021? A fib ablation scheduled?   Patient presents for 59-month follow-up.  She was recently seen by Dr. Elberta Fortis to switch her from metoprolol to carvedilol and advised that she would be a good candidate for ablation, however patient has opted to hold off on proceeding with ablation until at least the first of next year given some insurance issues.  Patient plans to follow-up with Dr. Elberta Fortis in January 2023 for further management.  She is feeling well overall, her only specific complaint today is hair loss which she attributes to carvedilol.  Although her blood pressure is soft both in the office today and at home she is asymptomatic.  Denies dyspnea, dizziness, syncope, near syncope.  Denies chest pain, palpitations.  Patient continues to take Lasix as needed for shortness of breath and swelling.  She is taking medications as directed and tolerating current regimen without issue.  Past Medical History:  Diagnosis Date   Diabetes mellitus without complication (HCC)    Hyperlipidemia    Stroke [facility])    Vertebral artery dissection 2018   Past Surgical History:  Procedure Laterality Date   CORONARY THROMBECTOMY N/A 09/21/2020   Procedure: Coronary Thrombectomy;  Surgeon: Yates Decamp, MD;  Location: [facility] INVASIVE CV LAB;  Service:  Cardiovascular;  Laterality: N/A;   CORONARY/GRAFT ACUTE MI REVASCULARIZATION N/A 09/21/2020   Procedure: Coronary/Graft Acute MI Revascularization;  Surgeon: Yates Decamp, MD;  Location: [facility] INVASIVE CV LAB;  Service: Cardiovascular;  Laterality: N/A;   LEFT HEART CATH AND CORONARY ANGIOGRAPHY N/A 09/21/2020   Procedure: LEFT HEART CATH AND CORONARY ANGIOGRAPHY;  Surgeon: Yates Decamp, MD;  Location: [facility] INVASIVE CV LAB;  Service: Cardiovascular;  Laterality: N/A;   Family History  Problem Relation Age of Onset   Heart disease Mother     Social History   Tobacco Use   Smoking status: Every Day    Types: Cigarettes   Smokeless tobacco: Never  Substance Use Topics   Alcohol use: No   Marital Status: Divorced   ROS  Review of Systems  Constitutional: Positive for malaise/fatigue (improving). Negative for weight gain.  Cardiovascular:  Positive for dyspnea on exertion (improving). Negative for chest pain, claudication, leg swelling, near-syncope, orthopnea, palpitations, paroxysmal nocturnal dyspnea and syncope.  Neurological:  Negative for dizziness.   Objective  There were no vitals taken for this visit.     02/10/2021    9:18 AM 01/14/2021    9:58 AM 11/04/2020    9:14 AM  Vitals with BMI  Height     Weight 171 lbs 169 lbs 13 oz 179 lbs  BMI 29.34 29.13 30.71  Systolic 99 116 112  Diastolic 62 64 60  Pulse 55 58 61      Physical Exam Vitals reviewed.  Neck:  Vascular: No carotid bruit.  Cardiovascular:     Rate and Rhythm: Normal rate and regular rhythm.     Pulses: Intact distal pulses.          Carotid pulses are 2+ on the right side and 2+ on the left side.      Radial pulses are 2+ on the right side and 2+ on the left side.       Popliteal pulses are 2+ on the right side and 2+ on the left side.       Dorsalis pedis pulses are 1+ on the right side and 0 on the left side.       Posterior tibial pulses are 1+ on the right side and 1+ on the left side.      Heart sounds: S1 normal and S2 normal. No murmur heard.   No gallop.  Pulmonary:     Effort: Pulmonary effort is normal. No respiratory distress.     Breath sounds: No wheezing, rhonchi or rales.  Musculoskeletal:     Right lower leg: No edema.     Left lower leg: No edema.  Neurological:     Mental Status: She is alert.  Physical exam unchanged compared to previous.  Laboratory examination:   Recent Labs    09/28/20 0824 09/29/20 0148 09/30/20 0126 10/21/20 1040  NA 140 138 140 149*  K 3.6 3.4* 3.5 4.0  CL 109 107 111 103  CO2 24 23 22 25   GLUCOSE 177* 147* 183* 112*  BUN 10 14 13 9   CREATININE 0.72 0.68 0.72 0.89  CALCIUM 8.5* 8.5* 8.5* 9.3  GFRNONAA >60 >60 >60  --     CrCl cannot be calculated (Patient's most recent lab result is older than the maximum 21 days allowed.).     Latest Ref Rng & Units 10/21/2020   10:40 AM 09/30/2020    1:26 AM 09/29/2020    1:48 AM  CMP  Glucose 65 - 99 mg/dL 10/02/2020   10/01/2020   081    BUN 8 - 27 mg/dL 9   13   14     Creatinine 0.57 - 1.00 mg/dL 448   185      Sodium 134 - 144 mmol/L 149   140   138    Potassium 3.5 - 5.2 mmol/L 4.0   3.5   3.4    Chloride 96 - 106 mmol/L 103   111   107    CO2 20 - 29 mmol/L 25   22   23     Calcium 8.7 - 10.3 mg/dL 9.3   8.5   8.5    Total Protein 6.0 - 8.5 g/dL 7.2      Total Bilirubin 0.0 - 1.2 mg/dL 0.4      Alkaline Phos 44 - 121 IU/L 87      AST 0 - 40 IU/L 12      ALT 0 - 32 IU/L 8          Latest Ref Rng & Units 09/30/2020    1:26 AM 09/29/2020    1:48 AM 09/28/2020    8:24 AM  CBC  WBC 4.0 - 10.5 K/uL 16.9   16.4   19.3    Hemoglobin 12.0 - 15.0 g/dL   10/02/2020   10/01/2020    Hematocrit 36.0 - 46.0 % 36.4   35.4   36.7    Platelets 150 - 400 K/uL 379   352   401  Lipid Panel Recent Labs    09/21/20 0659  CHOL 146  TRIG 104  LDLCALC 101*  VLDL 21  HDL 24*  CHOLHDL 6.1     HEMOGLOBIN A1C Lab Results  Component Value Date   HGBA1C 7.4 (H) 09/21/2020   MPG 166  09/21/2020   TSH Recent Labs    10/21/20 1040  TSH 5.740*     External labs:  None   Allergies   Allergies  Allergen Reactions   Hydrocodone Nausea And Vomiting   Hydrocodone Nausea And Vomiting   Other Nausea And Vomiting and Other (See Comments)    States all pain medications make her very ill- vomits   Tyloxapol Nausea And Vomiting   Oxycodone-Acetaminophen Nausea And Vomiting   Prednisone Rash and Other (See Comments)    Chest pain and a short-lived rash on the neck    Medications Prior to Visit:   Outpatient Medications Prior to Visit  Medication Sig Dispense Refill   amiodarone (PACERONE) 200 MG tablet TAKE 1 TABLET BY MOUTH TWICE DAILY FOR ONE WEEK, THEN TAKE 1 TABLET DAILY 45 tablet 3   apixaban (ELIQUIS) 5 MG TABS tablet Take 1 tablet (5 mg total) by mouth 2 (two) times daily. 60 tablet 3   Artificial Tear Ointment (DRY EYES OP) Apply 1 drop to eye daily as needed (dry eyes).     aspirin 81 MG chewable tablet Chew 1 tablet (81 mg total) by mouth daily. 30 tablet 0   atorvastatin (LIPITOR) 80 MG tablet TAKE 1 TABLET BY MOUTH DAILY AT 8pm 30 tablet 3   carvedilol (COREG) 6.25 MG tablet TAKE 1 TABLET BY MOUTH TWICE DAILY 60 tablet 2   clopidogrel (PLAVIX) 75 MG tablet TAKE 1 TABLET BY MOUTH EVERY DAY 30 tablet 3   furosemide (LASIX) 20 MG tablet TAKE 1 TABLET BY MOUTH AS NEEDED FOR FLUID OR EDEMA 30 tablet 3   losartan (COZAAR) 25 MG tablet Take 0.5 tablets (12.5 mg total) by mouth daily. 45 tablet 3   LUMIFY 0.025 % SOLN Place 1 drop into both eyes daily as needed (for irritation).     metFORMIN (GLUCOPHAGE) 500 MG tablet Take 500-1,000 mg by mouth See admin instructions. 1000 mg in the morning 500 mg in the evening     potassium chloride (KLOR-CON) 10 MEQ tablet Take by mouth.     No facility-administered medications prior to visit.   Final Medications at End of Visit    No outpatient medications have been marked as taking for the 09/01/21 encounter  (Appointment) with Carlynn Purl, Tavoris Brisk C, PA-C.   Radiology:   No results found.  Cardiac Studies:   Coronary arteriogram 09/21/2020: LV hemodynamics: 121/18, EDP 21 mmHg.  Ao 126/74, mean 97 mmHg.  No pressure gradient across the aortic valve. LV: EF 30 to 35% with mid to distal anterior, anteroapical, apical and inferior apical akinesis to dyskinesis. LM: Large-caliber vessel.  Smooth and normal. LAD: Large vessel, just immediately after the first septal perforator, the LAD is flush occluded.  Small diagonal arises at this segment.  The LAD otherwise is a large wraparound LAD and supplies large part of the apical and inferoapical region.  There are collaterals to the right coronary artery. CX: Moderate to large vessel, appears smooth and normal. RCA: Severely diseased diffusely, mid segment appears subtotally to totally occluded with TIMI I flow.  Distal bed of the RCA could not be seen due to minimal flow.  There are ipsilateral and contralateral collaterals to the  distal right.   Intervention: Successful stenting of the proximal and mid segment of the LAD with a long 3.0 x 24 mm Synergy XD DES following aspiration thrombectomy, stent deployed at a peak pressure of 18 atmospheric pressure, stenosis reduced from 100% to 0% with stepup and stepdown and TIMI 0 to TIMI-3 flow at the end of the procedure. I did attempt to cross the RCA however extremely difficult and appeared to behave like a CTO, lesion left alone.  Patient was completely asymptomatic throughout the procedure. 135 mill contrast utilized.    Echocardiogram 03/10/2021:  Left ventricle cavity is normal in size. Normal left ventricular wall  thickness. Left ventricle regional wall motion findings: Mid anteroseptal,  Mid anterior, Mid inferoseptal, Apical anterior, Apical lateral, Apical  inferior, Apical septal and Apical cap akinesis. LVEF 30-35%. Doppler  evidence of grade III (restrictive) diastolic dysfunction, elevated LAP.   Calculated EF 49%.  Left atrial cavity is mildly dilated.  Mild to moderate mitral regurgitation.  Moderate tricuspid regurgitation. Estimated pulmonary artery systolic  pressure 45 mmHg.  Compared to previous study on 09/21/2020, mitral and tricuspid  regurgitation, pulmonary hypertension are new findings.   EKG:  ***   11/04/2020: Sinus rhythm at a rate of 63 bpm.  Right axis.  Anterolateral infarct, age undetermined.  Low voltage complexes.  10/02/2020: Sinus rhythm at a rate of 65 bpm.  Right axis Anterolateral infarct, age indeterminate.  Low voltage complexes, consider pulmonary disease pattern.  09/28/2020: Afib w/RVR 199 bpm Anterolateral infarct, age indeterminate  09/21/2020: Normal sinus rhythm, normal axis, diffuse anterolateral and inferior STEMI.  Low-voltage complexes.  Assessment   No diagnosis found.    There are no discontinued medications.    No orders of the defined types were placed in this encounter.   This patients CHA2DS2-VASc Score 4 (MI, F, stroke)and yearly risk of stroke 4.8%.   Recommendations:   Alexandra Ward is a 61 y.o. female  patient with hyperlipidemia, ongoing tobacco use, type 2 DM, h/o stroke (vertebral artery dissection 2018), STEMI (09/21/2020) treated with PCI to prox LAD, presented again 09/28/2020 and atrial fibrillation with RVR and LVEF 30-35%.  He was started on amiodarone and Eliquis and discharged.  Patient was last seen in our office 02/10/2021 at which time she was advised to follow-up in 3 months, unfortunately she was lost to follow-up until now. ***  ***Admission in 03/2021? A fib ablation scheduled?   Patient presents for 36-month office visit.  Her primary concern today is hair thinning which she feels is related to carvedilol.  Discussed with patient risks versus benefits of changing beta-blocker therapy, patient opted to continue with carvedilol as she is tolerating this otherwise.  Patient has had no known recurrence of  atrial fibrillation since last office visit.  She plans to follow-up with Dr. Elberta Fortis for further evaluation and reconsideration of ablation in January.  She continues to tolerate anticoagulation without bleeding diathesis, will continue this.  Given that she is on amiodarone Will obtain TSH and CMP at this time.  Although blood pressure is soft patient is asymptomatic, therefore we will not make changes to medications at this time.  However will hold off on transitioning from losartan to [facility] or adding spironolactone given soft blood pressure.  Patient has not been on guideline directed medical therapy, will therefore repeat echocardiogram to reevaluate LVEF.  Patient may need consideration for ICD implantation for primary prevention.  Follow-up in 3 months, sooner if needed.   Rayford Halsted, PA-C  08/31/2021, 2:07 PM Office: [phone]

## 2021-09-01 ENCOUNTER — Ambulatory Visit: Payer: Medicaid Other | Admitting: Student

## 2021-09-01 ENCOUNTER — Encounter: Payer: Self-pay | Admitting: Student

## 2021-09-01 VITALS — BP 94/62 | HR 58 | Temp 98.0°F | Resp 16 | Ht 64.0 in | Wt 177.0 lb

## 2021-09-01 DIAGNOSIS — I2510 Atherosclerotic heart disease of native coronary artery without angina pectoris: Secondary | ICD-10-CM

## 2021-09-01 DIAGNOSIS — I480 Paroxysmal atrial fibrillation: Secondary | ICD-10-CM

## 2021-09-01 DIAGNOSIS — R0602 Shortness of breath: Secondary | ICD-10-CM

## 2021-09-01 DIAGNOSIS — I5042 Chronic combined systolic (congestive) and diastolic (congestive) heart failure: Secondary | ICD-10-CM

## 2021-09-02 ENCOUNTER — Telehealth: Payer: Self-pay | Admitting: Cardiology

## 2021-09-02 NOTE — Telephone Encounter (Signed)
Pt reports that Elvin So, PA-C with Dr. Verl Dicker office told her to call here about pt assistance. Informed pt that our office cannot help further than we have by giving her assistance paperwork to fill out.  She tells me that she did call the number and they told her that paperwork was incorrect b/c it was for [facility] and she lives in [place]. Asked for number at top of the assistance paperwork but she was not near it currently. She will send me the number via mychart so that I may reach out to them to discuss options for pt. Patient verbalized understanding and agreeable to plan.

## 2021-09-02 NOTE — Telephone Encounter (Signed)
Patient calling in about getting patient assistant. Please advise

## 2021-09-05 ENCOUNTER — Other Ambulatory Visit: Payer: Self-pay | Admitting: Student

## 2021-09-06 NOTE — Telephone Encounter (Signed)
Refill request

## 2021-09-15 ENCOUNTER — Emergency Department (HOSPITAL_COMMUNITY): Payer: Self-pay

## 2021-09-15 ENCOUNTER — Encounter (HOSPITAL_COMMUNITY): Payer: Self-pay | Admitting: Cardiology

## 2021-09-15 ENCOUNTER — Encounter (HOSPITAL_COMMUNITY): Payer: Self-pay

## 2021-09-15 ENCOUNTER — Inpatient Hospital Stay (HOSPITAL_COMMUNITY)
Admission: EM | Disposition: A | Discharge status: Home / Self Care | Payer: Self-pay | Source: Home / Self Care | Attending: Cardiology

## 2021-09-15 ENCOUNTER — Inpatient Hospital Stay (HOSPITAL_COMMUNITY)
Admission: EM | Admit: 2021-09-15 | Discharge: 2021-09-17 | DRG: 286 | Disposition: A | Discharge status: Home / Self Care | Payer: Self-pay | Attending: Internal Medicine | Admitting: Internal Medicine

## 2021-09-15 ENCOUNTER — Telehealth: Payer: Self-pay

## 2021-09-15 ENCOUNTER — Inpatient Hospital Stay (HOSPITAL_COMMUNITY): Payer: Self-pay

## 2021-09-15 DIAGNOSIS — Z8249 Family history of ischemic heart disease and other diseases of the circulatory system: Secondary | ICD-10-CM

## 2021-09-15 DIAGNOSIS — I480 Paroxysmal atrial fibrillation: Secondary | ICD-10-CM | POA: Diagnosis present

## 2021-09-15 DIAGNOSIS — I213 ST elevation (STEMI) myocardial infarction of unspecified site: Principal | ICD-10-CM

## 2021-09-15 DIAGNOSIS — E785 Hyperlipidemia, unspecified: Secondary | ICD-10-CM | POA: Diagnosis present

## 2021-09-15 DIAGNOSIS — Z888 Allergy status to other drugs, medicaments and biological substances status: Secondary | ICD-10-CM

## 2021-09-15 DIAGNOSIS — I252 Old myocardial infarction: Secondary | ICD-10-CM

## 2021-09-15 DIAGNOSIS — Z885 Allergy status to narcotic agent status: Secondary | ICD-10-CM

## 2021-09-15 DIAGNOSIS — I10 Essential (primary) hypertension: Secondary | ICD-10-CM | POA: Diagnosis present

## 2021-09-15 DIAGNOSIS — Z720 Tobacco use: Secondary | ICD-10-CM | POA: Diagnosis present

## 2021-09-15 DIAGNOSIS — J9312 Secondary spontaneous pneumothorax: Secondary | ICD-10-CM | POA: Diagnosis not present

## 2021-09-15 DIAGNOSIS — E1165 Type 2 diabetes mellitus with hyperglycemia: Secondary | ICD-10-CM | POA: Diagnosis present

## 2021-09-15 DIAGNOSIS — I2510 Atherosclerotic heart disease of native coronary artery without angina pectoris: Secondary | ICD-10-CM | POA: Diagnosis present

## 2021-09-15 DIAGNOSIS — I5020 Unspecified systolic (congestive) heart failure: Secondary | ICD-10-CM

## 2021-09-15 DIAGNOSIS — Z7982 Long term (current) use of aspirin: Secondary | ICD-10-CM

## 2021-09-15 DIAGNOSIS — Z20822 Contact with and (suspected) exposure to covid-19: Secondary | ICD-10-CM | POA: Diagnosis present

## 2021-09-15 DIAGNOSIS — F17210 Nicotine dependence, cigarettes, uncomplicated: Secondary | ICD-10-CM | POA: Diagnosis present

## 2021-09-15 DIAGNOSIS — Z955 Presence of coronary angioplasty implant and graft: Secondary | ICD-10-CM

## 2021-09-15 DIAGNOSIS — Z7901 Long term (current) use of anticoagulants: Secondary | ICD-10-CM

## 2021-09-15 DIAGNOSIS — Z79899 Other long term (current) drug therapy: Secondary | ICD-10-CM

## 2021-09-15 DIAGNOSIS — I5023 Acute on chronic systolic (congestive) heart failure: Secondary | ICD-10-CM | POA: Diagnosis present

## 2021-09-15 DIAGNOSIS — I110 Hypertensive heart disease with heart failure: Principal | ICD-10-CM | POA: Diagnosis present

## 2021-09-15 DIAGNOSIS — J9601 Acute respiratory failure with hypoxia: Secondary | ICD-10-CM

## 2021-09-15 DIAGNOSIS — J449 Chronic obstructive pulmonary disease, unspecified: Secondary | ICD-10-CM | POA: Diagnosis present

## 2021-09-15 DIAGNOSIS — I255 Ischemic cardiomyopathy: Secondary | ICD-10-CM | POA: Diagnosis present

## 2021-09-15 DIAGNOSIS — E861 Hypovolemia: Secondary | ICD-10-CM | POA: Diagnosis present

## 2021-09-15 DIAGNOSIS — Z8673 Personal history of transient ischemic attack (TIA), and cerebral infarction without residual deficits: Secondary | ICD-10-CM

## 2021-09-15 DIAGNOSIS — J811 Chronic pulmonary edema: Secondary | ICD-10-CM | POA: Diagnosis present

## 2021-09-15 DIAGNOSIS — I5032 Chronic diastolic (congestive) heart failure: Secondary | ICD-10-CM

## 2021-09-15 DIAGNOSIS — Z7984 Long term (current) use of oral hypoglycemic drugs: Secondary | ICD-10-CM

## 2021-09-15 DIAGNOSIS — J810 Acute pulmonary edema: Secondary | ICD-10-CM

## 2021-09-15 HISTORY — PX: CORONARY/GRAFT ACUTE MI REVASCULARIZATION: CATH118305

## 2021-09-15 LAB — CBC WITH DIFFERENTIAL/PLATELET
Abs Immature Granulocytes: 0 10*3/uL (ref 0.00–0.07)
Basophils Absolute: 0 10*3/uL (ref 0.0–0.1)
Basophils Relative: 0 %
Eosinophils Absolute: 0.4 10*3/uL (ref 0.0–0.5)
Eosinophils Relative: 2 %
HCT: 41.4 % (ref 36.0–46.0)
Hemoglobin: 12.8 g/dL (ref 12.0–15.0)
Lymphocytes Relative: 43 %
Lymphs Abs: 9.4 10*3/uL — ABNORMAL HIGH (ref 0.7–4.0)
MCH: 28.9 pg (ref 26.0–34.0)
MCHC: 30.9 g/dL (ref 30.0–36.0)
MCV: 93.5 fL (ref 80.0–100.0)
Monocytes Absolute: 0.2 10*3/uL (ref 0.1–1.0)
Monocytes Relative: 1 %
Neutro Abs: 11.8 10*3/uL — ABNORMAL HIGH (ref 1.7–7.7)
Neutrophils Relative %: 54 %
Platelets: 359 10*3/uL (ref 150–400)
RBC: 4.43 MIL/uL (ref 3.87–5.11)
RDW: 14.3 % (ref 11.5–15.5)
WBC: 21.9 10*3/uL — ABNORMAL HIGH (ref 4.0–10.5)
nRBC: 0 % (ref 0.0–0.2)
nRBC: 0 /100 WBC

## 2021-09-15 LAB — COMPREHENSIVE METABOLIC PANEL
ALT: 15 U/L (ref 0–44)
AST: 20 U/L (ref 15–41)
Albumin: 3.3 g/dL — ABNORMAL LOW (ref 3.5–5.0)
Alkaline Phosphatase: 68 U/L (ref 38–126)
Anion gap: 14 (ref 5–15)
BUN: 14 mg/dL (ref 8–23)
CO2: 23 mmol/L (ref 22–32)
Calcium: 8.3 mg/dL — ABNORMAL LOW (ref 8.9–10.3)
Chloride: 104 mmol/L (ref 98–111)
Creatinine, Ser: 1.31 mg/dL — ABNORMAL HIGH (ref 0.44–1.00)
GFR, Estimated: 46 mL/min — ABNORMAL LOW (ref >60)
Glucose, Bld: 322 mg/dL — ABNORMAL HIGH (ref 70–99)
Potassium: 4.5 mmol/L (ref 3.5–5.1)
Sodium: 141 mmol/L (ref 135–145)
Total Bilirubin: 0.7 mg/dL (ref 0.3–1.2)
Total Protein: 6.8 g/dL (ref 6.5–8.1)

## 2021-09-15 LAB — GLUCOSE, CAPILLARY
Glucose-Capillary: 263 mg/dL — ABNORMAL HIGH (ref 70–99)
Glucose-Capillary: 292 mg/dL — ABNORMAL HIGH (ref 70–99)
Glucose-Capillary: 155 mg/dL — ABNORMAL HIGH (ref 70–99)
Glucose-Capillary: 150 mg/dL — ABNORMAL HIGH (ref 70–99)
Glucose-Capillary: 197 mg/dL — ABNORMAL HIGH (ref 70–99)
Glucose-Capillary: 178 mg/dL — ABNORMAL HIGH (ref 70–99)

## 2021-09-15 LAB — CBC
HCT: 38.9 % (ref 36.0–46.0)
Hemoglobin: 12.5 g/dL (ref 12.0–15.0)
MCH: 29.1 pg (ref 26.0–34.0)
MCHC: 32.1 g/dL (ref 30.0–36.0)
MCV: 90.7 fL (ref 80.0–100.0)
Platelets: 333 10*3/uL (ref 150–400)
RBC: 4.29 MIL/uL (ref 3.87–5.11)
RDW: 14.6 % (ref 11.5–15.5)
WBC: 18.5 10*3/uL — ABNORMAL HIGH (ref 4.0–10.5)
nRBC: 0 % (ref 0.0–0.2)

## 2021-09-15 LAB — BODY FLUID CELL COUNT WITH DIFFERENTIAL
Eos, Fluid: 5 %
Lymphs, Fluid: 39 %
Monocyte-Macrophage-Serous Fluid: 12 % — ABNORMAL LOW (ref 50–90)
Neutrophil Count, Fluid: 43 % — ABNORMAL HIGH (ref 0–25)
Other Cells, Fluid: 1 %
Total Nucleated Cell Count, Fluid: 810 cu mm (ref 0–1000)

## 2021-09-15 LAB — PROTEIN, TOTAL: Total Protein: 7 g/dL (ref 6.5–8.1)

## 2021-09-15 LAB — ALBUMIN, PLEURAL OR PERITONEAL FLUID: Albumin, Fluid: 1.6 g/dL

## 2021-09-15 LAB — PROTEIN, PLEURAL OR PERITONEAL FLUID: Total protein, fluid: <3 g/dL

## 2021-09-15 LAB — TROPONIN I (HIGH SENSITIVITY)
Troponin I (High Sensitivity): 20 ng/L — ABNORMAL HIGH (ref <18)
Troponin I (High Sensitivity): 95 ng/L — ABNORMAL HIGH (ref <18)

## 2021-09-15 LAB — HEMOGLOBIN A1C
Hgb A1c MFr Bld: 6 % — ABNORMAL HIGH (ref 4.8–5.6)
Mean Plasma Glucose: 125.5 mg/dL

## 2021-09-15 LAB — BASIC METABOLIC PANEL
Anion gap: 8 (ref 5–15)
BUN: 16 mg/dL (ref 8–23)
CO2: 26 mmol/L (ref 22–32)
Calcium: 8.3 mg/dL — ABNORMAL LOW (ref 8.9–10.3)
Chloride: 107 mmol/L (ref 98–111)
Creatinine, Ser: 1.22 mg/dL — ABNORMAL HIGH (ref 0.44–1.00)
GFR, Estimated: 50 mL/min — ABNORMAL LOW (ref >60)
Glucose, Bld: 147 mg/dL — ABNORMAL HIGH (ref 70–99)
Potassium: 4.4 mmol/L (ref 3.5–5.1)
Sodium: 141 mmol/L (ref 135–145)

## 2021-09-15 LAB — BRAIN NATRIURETIC PEPTIDE: B Natriuretic Peptide: 512.4 pg/mL — ABNORMAL HIGH (ref 0.0–100.0)

## 2021-09-15 LAB — MAGNESIUM: Magnesium: 2 mg/dL (ref 1.7–2.4)

## 2021-09-15 LAB — LIPID PANEL
Cholesterol: 132 mg/dL (ref 0–200)
HDL: 36 mg/dL — ABNORMAL LOW (ref >40)
LDL Cholesterol: 74 mg/dL (ref 0–99)
Total CHOL/HDL Ratio: 3.7 RATIO
Triglycerides: 111 mg/dL (ref <150)
VLDL: 22 mg/dL (ref 0–40)

## 2021-09-15 LAB — LACTATE DEHYDROGENASE, PLEURAL OR PERITONEAL FLUID: LD, Fluid: 76 U/L — ABNORMAL HIGH (ref 3–23)

## 2021-09-15 LAB — PROTIME-INR
INR: 1.3 — ABNORMAL HIGH (ref 0.8–1.2)
INR: 1.3 — ABNORMAL HIGH (ref 0.8–1.2)
Prothrombin Time: 15.9 seconds — ABNORMAL HIGH (ref 11.4–15.2)
Prothrombin Time: 16.4 seconds — ABNORMAL HIGH (ref 11.4–15.2)

## 2021-09-15 LAB — RESP PANEL BY RT-PCR (FLU A&B, COVID) ARPGX2
Influenza A by PCR: NEGATIVE
Influenza B by PCR: NEGATIVE
SARS Coronavirus 2 by RT PCR: NEGATIVE

## 2021-09-15 LAB — MRSA NEXT GEN BY PCR, NASAL: MRSA by PCR Next Gen: NOT DETECTED

## 2021-09-15 LAB — PATHOLOGIST SMEAR REVIEW

## 2021-09-15 LAB — APTT: aPTT: 29 seconds (ref 24–36)

## 2021-09-15 SURGERY — CORONARY/GRAFT ACUTE MI REVASCULARIZATION
Anesthesia: LOCAL

## 2021-09-15 MED ORDER — ROCURONIUM BROMIDE 10 MG/ML (PF) SYRINGE
PREFILLED_SYRINGE | INTRAVENOUS | Status: AC
  Filled 2021-09-15: qty 10

## 2021-09-15 MED ORDER — HYDRALAZINE HCL 20 MG/ML IJ SOLN
10.0000 mg | INTRAMUSCULAR | Status: AC | PRN
Start: 2021-09-15 — End: 2021-09-15

## 2021-09-15 MED ORDER — PROPOFOL 1000 MG/100ML IV EMUL: 5.0000 ug/kg/min | INTRAVENOUS | Status: DC

## 2021-09-15 MED ORDER — DOCUSATE SODIUM 50 MG/5ML PO LIQD
100.0000 mg | Freq: Two times a day (BID) | ORAL | Status: DC
  Administered 2021-09-15: 100 mg via ORAL
  Filled 2021-09-15 (×6): qty 10

## 2021-09-15 MED ORDER — LIDOCAINE HCL (PF) 1 % IJ SOLN
INTRAMUSCULAR | Status: AC
  Filled 2021-09-15: qty 30

## 2021-09-15 MED ORDER — HEPARIN (PORCINE) IN NACL 1000-0.9 UT/500ML-% IV SOLN
INTRAVENOUS | Status: DC | PRN
  Administered 2021-09-15 (×2): 500 mL

## 2021-09-15 MED ORDER — HEPARIN SODIUM (PORCINE) 1000 UNIT/ML IJ SOLN
INTRAMUSCULAR | Status: AC
  Filled 2021-09-15: qty 10

## 2021-09-15 MED ORDER — LABETALOL HCL 5 MG/ML IV SOLN
10.0000 mg | INTRAVENOUS | Status: AC | PRN
Start: 2021-09-15 — End: 2021-09-15

## 2021-09-15 MED ORDER — SODIUM CHLORIDE 0.9 % IV SOLN
250.0000 mL | INTRAVENOUS | Status: DC
  Administered 2021-09-16: 250 mL via INTRAVENOUS

## 2021-09-15 MED ORDER — SODIUM CHLORIDE 0.9% FLUSH
3.0000 mL | Freq: Two times a day (BID) | INTRAVENOUS | Status: DC
  Administered 2021-09-15 – 2021-09-17 (×5): 3 mL via INTRAVENOUS

## 2021-09-15 MED ORDER — POLYETHYLENE GLYCOL 3350 17 G PO PACK: 17.0000 g | PACK | Freq: Every day | ORAL | Status: DC

## 2021-09-15 MED ORDER — ONDANSETRON HCL 4 MG/2ML IJ SOLN: 4.0000 mg | Freq: Four times a day (QID) | INTRAMUSCULAR | Status: DC | PRN

## 2021-09-15 MED ORDER — AMIODARONE HCL 200 MG PO TABS
100.0000 mg | ORAL_TABLET | Freq: Every day | ORAL | Status: DC
Start: 2021-09-15 — End: 2021-09-15

## 2021-09-15 MED ORDER — FENTANYL 2500MCG IN NS 250ML (10MCG/ML) PREMIX INFUSION
50.0000 ug/h | INTRAVENOUS | Status: DC
  Administered 2021-09-15: 50 ug/h via INTRAVENOUS
  Filled 2021-09-15: qty 250

## 2021-09-15 MED ORDER — SODIUM CHLORIDE 0.9 % IV SOLN: 250.0000 mL | INTRAVENOUS | Status: DC | PRN

## 2021-09-15 MED ORDER — FUROSEMIDE 10 MG/ML IJ SOLN
40.0000 mg | Freq: Two times a day (BID) | INTRAMUSCULAR | Status: DC
  Administered 2021-09-15 (×2): 40 mg via INTRAVENOUS
  Filled 2021-09-15 (×2): qty 4

## 2021-09-15 MED ORDER — ROCURONIUM BROMIDE 50 MG/5ML IV SOLN
INTRAVENOUS | Status: DC | PRN
  Administered 2021-09-15: 100 mg via INTRAVENOUS

## 2021-09-15 MED ORDER — ETOMIDATE 2 MG/ML IV SOLN
INTRAVENOUS | Status: AC
  Filled 2021-09-15: qty 20

## 2021-09-15 MED ORDER — AMIODARONE HCL 100 MG PO TABS
100.0000 mg | ORAL_TABLET | Freq: Every day | ORAL | Status: DC
  Administered 2021-09-15 – 2021-09-17 (×3): 100 mg via ORAL
  Filled 2021-09-15 (×3): qty 1

## 2021-09-15 MED ORDER — PANTOPRAZOLE 2 MG/ML SUSPENSION: 40.0000 mg | Freq: Every day | ORAL | Status: DC

## 2021-09-15 MED ORDER — FUROSEMIDE 10 MG/ML IJ SOLN
INTRAMUSCULAR | Status: DC | PRN
  Administered 2021-09-15: 40 mg via INTRAVENOUS

## 2021-09-15 MED ORDER — INSULIN ASPART 100 UNIT/ML IJ SOLN
0.0000 [IU] | Freq: Three times a day (TID) | INTRAMUSCULAR | Status: DC
  Administered 2021-09-15: 3 [IU] via SUBCUTANEOUS
  Administered 2021-09-15: 2 [IU] via SUBCUTANEOUS
  Administered 2021-09-15 – 2021-09-16 (×2): 3 [IU] via SUBCUTANEOUS
  Administered 2021-09-16: 2 [IU] via SUBCUTANEOUS
  Administered 2021-09-16: 3 [IU] via SUBCUTANEOUS

## 2021-09-15 MED ORDER — INSULIN ASPART 100 UNIT/ML IJ SOLN
4.0000 [IU] | Freq: Three times a day (TID) | INTRAMUSCULAR | Status: DC
  Administered 2021-09-15 – 2021-09-16 (×4): 4 [IU] via SUBCUTANEOUS

## 2021-09-15 MED ORDER — VERAPAMIL HCL 2.5 MG/ML IV SOLN
INTRAVENOUS | Status: AC
  Filled 2021-09-15: qty 2

## 2021-09-15 MED ORDER — NITROGLYCERIN IN D5W 200-5 MCG/ML-% IV SOLN
INTRAVENOUS | Status: AC
  Filled 2021-09-15: qty 250

## 2021-09-15 MED ORDER — PANTOPRAZOLE SODIUM 40 MG PO TBEC
40.0000 mg | DELAYED_RELEASE_TABLET | Freq: Every day | ORAL | Status: DC
  Administered 2021-09-15 – 2021-09-16 (×2): 40 mg via ORAL
  Filled 2021-09-15 (×4): qty 1

## 2021-09-15 MED ORDER — FUROSEMIDE 10 MG/ML IJ SOLN
INTRAMUSCULAR | Status: AC
  Filled 2021-09-15: qty 4

## 2021-09-15 MED ORDER — INSULIN ASPART 100 UNIT/ML IJ SOLN: 0.0000 [IU] | Freq: Every day | INTRAMUSCULAR | Status: DC

## 2021-09-15 MED ORDER — HEPARIN SODIUM (PORCINE) 1000 UNIT/ML IJ SOLN
INTRAMUSCULAR | Status: DC | PRN
  Administered 2021-09-15: 4000 [IU] via INTRAVENOUS

## 2021-09-15 MED ORDER — ACETAMINOPHEN 160 MG/5ML PO SOLN: 650.0000 mg | ORAL | Status: DC | PRN

## 2021-09-15 MED ORDER — ETOMIDATE 2 MG/ML IV SOLN
INTRAVENOUS | Status: DC | PRN
  Administered 2021-09-15: 20 mg via INTRAVENOUS

## 2021-09-15 MED ORDER — VERAPAMIL HCL 2.5 MG/ML IV SOLN
INTRAVENOUS | Status: DC | PRN
  Administered 2021-09-15: 10 mL via INTRA_ARTERIAL

## 2021-09-15 MED ORDER — PROPOFOL 1000 MG/100ML IV EMUL
INTRAVENOUS | Status: AC
  Administered 2021-09-15: 5 ug/kg/min via INTRAVENOUS
  Filled 2021-09-15: qty 100

## 2021-09-15 MED ORDER — APIXABAN 5 MG PO TABS
5.0000 mg | ORAL_TABLET | Freq: Two times a day (BID) | ORAL | Status: DC
Start: 2021-09-15 — End: 2021-09-15

## 2021-09-15 MED ORDER — HEPARIN (PORCINE) IN NACL 1000-0.9 UT/500ML-% IV SOLN
INTRAVENOUS | Status: AC
  Filled 2021-09-15: qty 1000

## 2021-09-15 MED ORDER — HYDROMORPHONE HCL 2 MG PO TABS: 1.0000 mg | ORAL_TABLET | ORAL | Status: DC | PRN

## 2021-09-15 MED ORDER — APIXABAN 5 MG PO TABS
5.0000 mg | ORAL_TABLET | Freq: Two times a day (BID) | ORAL | Status: DC
  Administered 2021-09-15 – 2021-09-17 (×5): 5 mg via ORAL
  Filled 2021-09-15 (×5): qty 1

## 2021-09-15 MED ORDER — FENTANYL BOLUS VIA INFUSION: 50.0000 ug | INTRAVENOUS | Status: DC | PRN

## 2021-09-15 MED ORDER — ATORVASTATIN CALCIUM 80 MG PO TABS
80.0000 mg | ORAL_TABLET | Freq: Every day | ORAL | Status: DC
Start: 2021-09-15 — End: 2021-09-15

## 2021-09-15 MED ORDER — ASPIRIN 81 MG PO CHEW
81.0000 mg | CHEWABLE_TABLET | Freq: Every day | ORAL | Status: DC
Start: 2021-09-15 — End: 2021-09-15

## 2021-09-15 MED ORDER — NOREPINEPHRINE 4 MG/250ML-% IV SOLN
2.0000 ug/min | INTRAVENOUS | Status: DC
  Administered 2021-09-16: 3 ug/min via INTRAVENOUS
  Filled 2021-09-15: qty 250

## 2021-09-15 MED ORDER — NOREPINEPHRINE 4 MG/250ML-% IV SOLN
INTRAVENOUS | Status: AC
  Administered 2021-09-15: 2 ug/min via INTRAVENOUS
  Filled 2021-09-15: qty 250

## 2021-09-15 MED ORDER — FENTANYL CITRATE PF 50 MCG/ML IJ SOSY
50.0000 ug | PREFILLED_SYRINGE | Freq: Once | INTRAMUSCULAR | Status: DC
  Filled 2021-09-15: qty 1

## 2021-09-15 MED ORDER — ASPIRIN 81 MG PO CHEW
81.0000 mg | CHEWABLE_TABLET | Freq: Every day | ORAL | Status: DC
  Administered 2021-09-15 – 2021-09-17 (×3): 81 mg via ORAL
  Filled 2021-09-15 (×3): qty 1

## 2021-09-15 MED ORDER — SODIUM CHLORIDE 0.9% FLUSH
10.0000 mL | Freq: Three times a day (TID) | INTRAVENOUS | Status: DC
  Administered 2021-09-15 – 2021-09-17 (×5): 10 mL

## 2021-09-15 MED ORDER — SODIUM CHLORIDE 0.9% FLUSH: 3.0000 mL | INTRAVENOUS | Status: DC | PRN

## 2021-09-15 MED ORDER — POLYETHYLENE GLYCOL 3350 17 G PO PACK
17.0000 g | PACK | Freq: Every day | ORAL | Status: DC
  Filled 2021-09-15 (×3): qty 1

## 2021-09-15 MED ORDER — DOCUSATE SODIUM 50 MG/5ML PO LIQD: 100.0000 mg | Freq: Two times a day (BID) | ORAL | Status: DC

## 2021-09-15 MED ORDER — ATORVASTATIN CALCIUM 80 MG PO TABS: 80.0000 mg | ORAL_TABLET | Freq: Every day | ORAL | Status: DC

## 2021-09-15 MED ORDER — ATORVASTATIN CALCIUM 80 MG PO TABS
80.0000 mg | ORAL_TABLET | Freq: Every day | ORAL | Status: DC
  Administered 2021-09-15 – 2021-09-17 (×3): 80 mg via ORAL
  Filled 2021-09-15 (×3): qty 1

## 2021-09-15 MED ORDER — APIXABAN 5 MG PO TABS: 5.0000 mg | ORAL_TABLET | Freq: Two times a day (BID) | ORAL | Status: DC

## 2021-09-15 MED ORDER — CHLORHEXIDINE GLUCONATE CLOTH 2 % EX PADS
6.0000 | MEDICATED_PAD | Freq: Every day | CUTANEOUS | Status: DC
  Administered 2021-09-15 – 2021-09-17 (×3): 6 via TOPICAL

## 2021-09-15 MED ORDER — AMIODARONE HCL 200 MG PO TABS: 100.0000 mg | ORAL_TABLET | Freq: Every day | ORAL | Status: DC

## 2021-09-15 SURGICAL SUPPLY — 12 items
CATH DIAG 6FR JR4 (CATHETERS) ×1 IMPLANT
CATH OPTITORQUE TIG 4.0 5F (CATHETERS) ×1 IMPLANT
GLIDESHEATH SLEND A-KIT 6F 22G (SHEATH) ×1 IMPLANT
GUIDEWIRE INQWIRE 1.5J.035X260 (WIRE) IMPLANT
INQWIRE 1.5J .035X260CM (WIRE) ×2
KIT ENCORE 26 ADVANTAGE (KITS) ×1 IMPLANT
KIT HEART LEFT (KITS) ×2 IMPLANT
KIT HEMO VALVE WATCHDOG (MISCELLANEOUS) ×1 IMPLANT
PACK CARDIAC CATHETERIZATION (CUSTOM PROCEDURE TRAY) ×2 IMPLANT
SHEATH PROBE COVER 6X72 (BAG) ×1 IMPLANT
TRANSDUCER W/STOPCOCK (MISCELLANEOUS) ×2 IMPLANT
TUBING CIL FLEX 10 FLL-RA (TUBING) ×2 IMPLANT

## 2021-09-15 NOTE — CV Procedure (Signed)
Known mid RCA CTO Patent LAD stent. No stent thrombosis. LVEDP 37 mmHg   Elder Negus, MD Pager: [phone] Office: [phone]

## 2021-09-15 NOTE — Progress Notes (Signed)
RT note- patient placed on Bipap for low sp02, and sleepy from procedure, wean bipap as tolerated.

## 2021-09-15 NOTE — Procedures (Signed)
Extubation Procedure Note  Patient Details:   Name: Alexandra Ward DOB: 09/21/1960 MRN: [id number]   Airway Documentation:    Vent end date: 09/15/21 Vent end time: 0900   Evaluation  O2 sats: stable throughout Complications: No apparent complications Patient did tolerate procedure well. Bilateral Breath Sounds: Diminished, Rhonchi   Yes Pt was suctioned orally and via ETT. Audible cuff leak was heard. Pt was extubated to [facility]. No complications.IS at bedside. RT will continue to monitor.   Hillis Range 09/15/2021, 9:04 AM

## 2021-09-15 NOTE — Procedures (Signed)
Insertion of Chest Tube Procedure Note  ZOBIA MATTERS  JU:[id number]  Oct 21, 1960  Date:09/15/21  Time:9:19 AM    Provider Performing: Candee Furbish   Procedure: Pleural Catheter Insertion w/ Imaging Guidance [phone])  Indication(s) Hydropneumothorax  Consent Verbal at bedside  Anesthesia Topical only with 1% lidocaine    Time Out Verified patient identification, verified procedure, site/side was marked, verified correct patient position, special equipment/implants available, medications/allergies/relevant history reviewed, required imaging and test results available.   Sterile Technique Maximal sterile technique including full sterile barrier drape, hand hygiene, sterile gown, sterile gloves, mask, hair covering, sterile ultrasound probe cover (if used).   Procedure Description Ultrasound used to identify appropriate pleural anatomy for placement and overlying skin marked. Area of placement cleaned and draped in sterile fashion.  A 14 French pigtail pleural catheter was placed into the right pleural space using Seldinger technique. Appropriate return of fluid was obtained.  The tube was connected to atrium and placed on -20 cm H2O wall suction.   Complications/Tolerance None; patient tolerated the procedure well. Chest X-ray is ordered to verify placement.   EBL Minimal  Specimen(s) fluid

## 2021-09-15 NOTE — Progress Notes (Signed)
Pt has PRN Bipap orders, no distress noted at this time. Pt on 4L [place], Sp02 96%.

## 2021-09-15 NOTE — Consult Note (Signed)
NAME:  Alexandra Ward, MRN:  [id number], DOB:  08-19-1960, LOS: 0 ADMISSION DATE:  09/15/2021 CONSULTATION DATE:  09/15/2021 REFERRING MD:  Virgina Jock - Cards CHIEF COMPLAINT:  Post-procedure vent management   History of Present Illness:  61 year old woman who presented to [facility] ED 6/14 with SOB, Code STEMI. PMHx significant for HLD, CAD with STEMI (09/2020, s/p PCI to prox LAD), HFrEF (Echo 03/2021 with EF 30-35%, +RWMAs, G3DD, new MR/TR), Afib (on Eliquis), CVA (vertebral artery dissection 2018), T2DM, likely COPD, tobacco abuse.  History is obtained from chart review. Presented to [facility] ED 6/13 for acute onset of SOB, at the time of presentation denied CP. Reports medication compliance. Takes ASA and Eliquis for Afib, Plavix discontinued. EKG was obtained demonstrating old anterolateral infarct with ST elevation in leads V1-V3 (changes since 5/31) with concern for prior placed LAD stent thrombosis. CXR demonstrating flash pulmonary edema. Intubated to allow for airway protection in the setting of edema and need for coronary angiography. Underwent cardiac cath with Dr. Virgina Jock 6/14AM with findings of patent LAD stent, known mid RCA CTO. Patient was transferred to [facility] post-procedure.  PCCM consulted for assistance with vent management/respiratory status post-procedure.  Pertinent Medical History:   Past Medical History:  Diagnosis Date   Diabetes mellitus without complication ([place])    Hyperlipidemia    Stroke [facility])    Vertebral artery dissection 2018   Significant Hospital Events: Including procedures, antibiotic start and stop dates in addition to other pertinent events   6/14 - Presented to [facility] ED via EMS for acute onset SOB. EKG - prior known infarct but with EKG changes/ST elevation in V1-V3 c/f STEMI/prior LAD stent thrombosis. CXR with flash pulm edema. Intubated for airway protection. Taken to cath lab, no STEMI/stent thrombosis. Transfer to [place]. PCCM consulted for vent  management.  Interim History / Subjective:  PCCM consulted for vent management post-procedure  Objective:  Blood pressure 94/61, pulse 73, temperature 98.1 F (36.7 C), temperature source Oral, resp. rate 20, height 5\' 4"  (1.626 m), SpO2 97 %.    Vent Mode: PRVC FiO2 (%):  [60 %] 60 % Set Rate:  [20 bmp] 20 bmp Vt Set:  [440 mL] 440 mL PEEP:  [5 cmH20] 5 cmH20 Plateau Pressure:  [27 cmH20] 27 cmH20   Intake/Output Summary (Last 24 hours) at 09/15/2021 0345 Last data filed at 09/15/2021 0300 Gross per 24 hour  Intake --  Output 500 ml  Net -500 ml   There were no vitals filed for this visit.  Physical Examination: General: Acutely ill-appearing middle-aged woman in NAD. HEENT: [place]/AT, anicteric sclera, PERRL, moist mucous membranes. ETT/OGT in place. Neuro:  Awake, unable to assess orientation. Appears uncomfortable.  Responds to verbal stimuli. Following commands consistently. Moves all 4 extremities spontaneously. +Cough and +Gag  CV: RRR, no m/g/r. PULM: Breathing even and unlabored on vent (PEEP 5, FiO2 60%). Lung fields with coarse rhonchi throughout. GI: Obese, soft, nontender, nondistended. Normoactive bowel sounds. Extremities: Bilateral symmetric 1+ LE edema noted. Skin: Warm/dry, no rashes.  Resolved Hospital Problem List:    Assessment & Plan:  Flash pulmonary edema Respiratory insufficiency post-procedure - Continue full vent support (4-8cc/kg IBW) - Wean FiO2 for O2 sat > 90% - Daily WUA/SBT, may be able to extubate 6/14 pending mental status and recovery from edema - Aggressive diuresis - VAP bundle - Pulmonary hygiene - PAD protocol for sedation: Propofol and Fentanyl for goal RASS 0 to -1  CAD History of STEMI 05/2020 s/p PCI with LAD  stent HFrEF Echo 03/2021 with EF 30-35%, +RWMAs, G3DD, new MR/TR, new pulm HTN. Additional HF therapies previously limited due to soft BP. - Diuresis per Cardiology, Lasix BID at present - Monitor I&Os, goal euvolemic to  net negative - Cardiac monitoring/telemetry - Goal MAP > 65, SBP > 80 - Levophed titrated to goal MAP  Atrial fibrillation History of CVA (vertebral dissection 2018) CHA2DS2-VASc Score 4 (MI, F, stroke) and yearly risk of stroke 4.8%. - Continue Eliquis - Resume amiodarone - Conitnue ASA, statin  Leukocytosis, likely reactive WBC 21.9 on admission. - Trend WBC, fever curve - Follow CXR, doubt superimposed PNA  AKI Cr on admission 1.31, baseline ~0.7. - Trend BMP with diuresis - Replete electrolytes as indicated - Monitor I&Os - Avoid nephrotoxic agents as able - Ensure adequate renal perfusion  T2DM with hyperglycemia Hemoglobin A1c 6.0%. - CBGs Q4H while intubated/NPO - Goal CBG 140-180 - SSI  History of hyperlipidemia Lipid panel on admission with low HDL (36), otherwise WNL. - Continue home statin  Best Practice: (right click and "Reselect all SmartList Selections" daily)   Per Primary Team  Labs:  CBC: Recent Labs  Lab 09/15/21 0035  WBC 21.9*  NEUTROABS 11.8*  HGB 12.8  HCT 41.4  MCV 93.5  PLT [id number]   Basic Metabolic Panel: Recent Labs  Lab 09/15/21 0035  NA 141  K 4.5  CL 104  CO2 23  GLUCOSE 322*  BUN 14  CREATININE 1.31*  CALCIUM 8.3*   GFR: Estimated Creatinine Clearance: 46.2 mL/min (A) (by C-G formula based on SCr of 1.31 mg/dL (H)). Recent Labs  Lab 09/15/21 0035  WBC 21.9*   Liver Function Tests: Recent Labs  Lab 09/15/21 0035  AST 20  ALT 15  ALKPHOS 68  BILITOT 0.7  PROT 6.8  ALBUMIN 3.3*   No results for input(s): "LIPASE", "AMYLASE" in the last 168 hours. No results for input(s): "AMMONIA" in the last 168 hours.  ABG:    Component Value Date/Time   TCO2 26 09/21/2020 0659    Coagulation Profile: Recent Labs  Lab 09/15/21 0035  INR 1.3*   Cardiac Enzymes: No results for input(s): "CKTOTAL", "CKMB", "CKMBINDEX", "TROPONINI" in the last 168 hours.  HbA1C: Hgb A1c MFr Bld  Date/Time Value Ref Range Status   09/15/2021 12:35 AM 6.0 (H) 4.8 - 5.6 % Final    Comment:    (NOTE) Pre diabetes:          5.7%-6.4%  Diabetes:              >6.4%  Glycemic control for   <7.0% adults with diabetes   09/21/2020 06:59 AM 7.4 (H) 4.8 - 5.6 % Final    Comment:    (NOTE)         Prediabetes: 5.7 - 6.4         Diabetes: >6.4         Glycemic control for adults with diabetes: <7.0    CBG: Recent Labs  Lab 09/15/21 0200 09/15/21 0203  GLUCAP 263* 292*   Review of Systems:   Review of systems completed with pertinent positives/negatives outlined in above HPI.  Past Medical History:  She,  has a past medical history of Diabetes mellitus without complication ([place]), Hyperlipidemia, and Stroke ([place]).   Surgical History:   Past Surgical History:  Procedure Laterality Date   CORONARY THROMBECTOMY N/A 09/21/2020   Procedure: Coronary Thrombectomy;  Surgeon: Adrian Prows, MD;  Location: [place] CV LAB;  Service: Cardiovascular;  Laterality: N/A;   CORONARY/GRAFT ACUTE MI REVASCULARIZATION N/A 09/21/2020   Procedure: Coronary/Graft Acute MI Revascularization;  Surgeon: Adrian Prows, MD;  Location: [place] [facility];  Service: Cardiovascular;  Laterality: N/A;   LEFT HEART CATH AND CORONARY ANGIOGRAPHY N/A 09/21/2020   Procedure: LEFT HEART CATH AND CORONARY ANGIOGRAPHY;  Surgeon: Adrian Prows, MD;  Location: [place] [facility];  Service: Cardiovascular;  Laterality: N/A;   Social History:   reports that she has been smoking cigarettes. She has never used smokeless tobacco. She reports that she does not drink alcohol and does not use drugs.   Family History:  Her family history includes Heart disease in her mother.   Allergies: Allergies  Allergen Reactions   Hydrocodone Nausea And Vomiting   Hydrocodone Nausea And Vomiting   Other Nausea And Vomiting and Other (See Comments)    States all pain medications make her very ill- vomits   Tyloxapol Nausea And Vomiting   Oxycodone-Acetaminophen Nausea  And Vomiting   Prednisone Rash and Other (See Comments)    Chest pain and a short-lived rash on the neck    Home Medications: Prior to Admission medications   Medication Sig Start Date End Date Taking? Authorizing Provider  amiodarone (PACERONE) 100 MG tablet Take 1 tablet (100 mg total) by mouth daily. 09/06/21   Cantwell, Celeste C, PA-C  apixaban (ELIQUIS) 5 MG TABS tablet Take 1 tablet (5 mg total) by mouth 2 (two) times daily. 10/02/20   Cantwell, Celeste C, PA-C  Artificial Tear Ointment (DRY EYES OP) Apply 1 drop to eye daily as needed (dry eyes).    [provider]  aspirin 81 MG chewable tablet Chew 1 tablet (81 mg total) by mouth daily. 09/23/20   Cantwell, Celeste C, PA-C  atorvastatin (LIPITOR) 80 MG tablet TAKE 1 TABLET BY MOUTH DAILY AT 8pm 03/24/21   Cantwell, Celeste C, PA-C  carvedilol (COREG) 6.25 MG tablet TAKE 1 TABLET BY MOUTH TWICE DAILY 08/25/21   Camnitz, Ocie Doyne, MD  furosemide (LASIX) 20 MG tablet TAKE 1 TABLET BY MOUTH AS NEEDED FOR FLUID OR EDEMA 07/22/21   Cantwell, Celeste C, PA-C  losartan (COZAAR) 25 MG tablet Take 0.5 tablets (12.5 mg total) by mouth daily. 02/10/21   Cantwell, Celeste C, PA-C  LUMIFY 0.025 % SOLN Place 1 drop into both eyes daily as needed (for irritation).    [provider]  metFORMIN (GLUCOPHAGE) 500 MG tablet Take 500-1,000 mg by mouth See admin instructions. 1000 mg in the morning 500 mg in the evening 09/23/20   [provider]  potassium chloride (KLOR-CON) 10 MEQ tablet Take by mouth. 10/22/20 02/10/21  [provider]    Critical care time: 30 minutes   Lestine Mount, PA-C [facility] 09/15/21 3:45 AM  Please see [email or web address] for pager details.  From 7A-7P if no response, please call [phone] After hours, please call ELink [phone]

## 2021-09-15 NOTE — ED Provider Notes (Signed)
[place] EMERGENCY DEPARTMENT Provider Note   CSN: [id number] Arrival date & time: 09/15/21  0001     History  Chief Complaint  Patient presents with   Code STEMI   Level 5 caveat due to shortness of breath/acuity of condition Alexandra Ward is a 61 y.o. female.  The history is provided by the patient and the EMS personnel. The history is limited by the condition of the patient.  Shortness of Breath Severity:  Severe Onset quality:  Sudden Timing:  Constant Progression:  Worsening Chronicity:  New Worsened by:  Nothing Associated symptoms: no chest pain   Patient with known history of diabetes, CAD, previous stroke presents with shortness of breath.  She reports abrupt onset of shortness of breath, EMS was called, patient was felt to have a STEMI therefore code STEMI was enacted. Patient denies any chest pain. History is limited due to respiratory distress    Past Medical History:  Diagnosis Date   Diabetes mellitus without complication ([place])    Hyperlipidemia    Stroke [facility])    Vertebral artery dissection 2018    Home Medications Prior to Admission medications   Medication Sig Start Date End Date Taking? Authorizing Provider  amiodarone (PACERONE) 100 MG tablet Take 1 tablet (100 mg total) by mouth daily. 09/06/21   Cantwell, Celeste C, PA-C  apixaban (ELIQUIS) 5 MG TABS tablet Take 1 tablet (5 mg total) by mouth 2 (two) times daily. 10/02/20   Cantwell, Celeste C, PA-C  Artificial Tear Ointment (DRY EYES OP) Apply 1 drop to eye daily as needed (dry eyes).    [provider]  aspirin 81 MG chewable tablet Chew 1 tablet (81 mg total) by mouth daily. 09/23/20   Cantwell, Celeste C, PA-C  atorvastatin (LIPITOR) 80 MG tablet TAKE 1 TABLET BY MOUTH DAILY AT 8pm 03/24/21   Cantwell, Celeste C, PA-C  carvedilol (COREG) 6.25 MG tablet TAKE 1 TABLET BY MOUTH TWICE DAILY 08/25/21   Camnitz, Ocie Doyne, MD  furosemide (LASIX) 20 MG tablet TAKE 1 TABLET  BY MOUTH AS NEEDED FOR FLUID OR EDEMA 07/22/21   Cantwell, Celeste C, PA-C  losartan (COZAAR) 25 MG tablet Take 0.5 tablets (12.5 mg total) by mouth daily. 02/10/21   Cantwell, Celeste C, PA-C  LUMIFY 0.025 % SOLN Place 1 drop into both eyes daily as needed (for irritation).    [provider]  metFORMIN (GLUCOPHAGE) 500 MG tablet Take 500-1,000 mg by mouth See admin instructions. 1000 mg in the morning 500 mg in the evening 09/23/20   [provider]  potassium chloride (KLOR-CON) 10 MEQ tablet Take by mouth. 10/22/20 02/10/21  [provider]      Allergies    Hydrocodone, Hydrocodone, Other, Tyloxapol, Oxycodone-acetaminophen, and Prednisone    Review of Systems   Review of Systems  Unable to perform ROS: Acuity of condition  Respiratory:  Positive for shortness of breath.   Cardiovascular:  Negative for chest pain.    Physical Exam Updated Vital Signs BP (!) 86/66 (BP Location: Left Arm)   Pulse 79   Temp 98.1 F (36.7 C) (Oral)   Resp 17   Ht 1.626 m (5\' 4" )   SpO2 100%   BMI 30.38 kg/m  Physical Exam CONSTITUTIONAL: Ill-appearing, respiratory distress no HEAD: Normocephalic/atraumatic EYES: EOMI/PERRL ENMT: Mucous membranes dry, hirsutism NECK: supple no meningeal signs CV: S1/S2 noted, no murmurs/rubs/gallops noted LUNGS: Crackles and rales bilaterally, distress noted ABDOMEN: soft, nontender, obese NEURO: Pt is awake/alert  but is ill-appearing EXTREMITIES: pulses normal/equal SKIN: Skin is cool and clammy PSYCH: Anxious  ED Results / Procedures / Treatments   Labs (all labs ordered are listed, but only abnormal results are displayed) Labs Reviewed  RESP PANEL BY RT-PCR (FLU A&B, COVID) ARPGX2  HEMOGLOBIN A1C  CBC WITH DIFFERENTIAL/PLATELET  PROTIME-INR  APTT  COMPREHENSIVE METABOLIC PANEL  LIPID PANEL  BRAIN NATRIURETIC PEPTIDE  TROPONIN I (HIGH SENSITIVITY)    EKG EKG Interpretation  Date/Time:  Wednesday September 15 2021  00:11:19 EDT Ventricular Rate:  101 PR Interval:  200 QRS Duration: 131 QT Interval:  394 QTC Calculation: 511 R Axis:   109 Text Interpretation: Sinus tachycardia Nonspecific intraventricular conduction delay Anteroseptal infarct, possibly acute >>> Acute MI <<< Interpretation limited secondary to artifact Confirmed by Ripley Fraise [phone]) on 09/15/2021 12:18:13 AM  Radiology DG Chest Port 1 View  Result Date: 09/15/2021 CLINICAL DATA:  Dyspnea EXAM: PORTABLE CHEST 1 VIEW COMPARISON:  03/21/2021 FINDINGS: There is progressive interstitial and airspace infiltrate throughout the left lung, more prevalent within the left lung base, in keeping with acute infection or asymmetric pulmonary edema. Moderate right pleural effusion is again seen and appears stable with associated right basilar compressive atelectasis. No pneumothorax. Cardiac size within normal limits. No acute bone abnormality. IMPRESSION: Progressive interstitial and airspace infiltrate throughout the left lung, more prevalent within the left lung base, in keeping with acute infection or asymmetric pulmonary edema. Electronically Signed   By: Fidela Salisbury M.D.   On: 09/15/2021 00:20    Procedures .Critical Care  Performed by: Ripley Fraise, MD Authorized by: Ripley Fraise, MD   Critical care provider statement:    Critical care time (minutes):  35   Critical care start time:  09/15/2021 12:05 AM   Critical care end time:  09/15/2021 12:40 AM   Critical care time was exclusive of:  Separately billable procedures and treating other patients   Critical care was necessary to treat or prevent imminent or life-threatening deterioration of the following conditions:  Respiratory failure, cardiac failure and circulatory failure   Critical care was time spent personally by me on the following activities:  Development of treatment plan with patient or surrogate, ordering and review of radiographic studies, pulse oximetry,  re-evaluation of patient's condition, ordering and performing treatments and interventions and examination of patient   I assumed direction of critical care for this patient from another provider in my specialty: no     Care discussed with: admitting provider   Procedure Name: Intubation Date/Time: 09/15/2021 12:30 AM  Performed by: Ripley Fraise, MDPre-anesthesia Checklist: Patient being monitored Oxygen Delivery Method: Nasal cannula Induction Type: Rapid sequence Ventilation: Mask ventilation without difficulty Laryngoscope Size: Glidescope Grade View: Grade I Tube size: 7.5 mm Number of attempts: 1 Airway Equipment and Method: Video-laryngoscopy Placement Confirmation: ETT inserted through vocal cords under direct vision, CO2 detector and Breath sounds checked- equal and bilateral Secured at: 23 cm Tube secured with: ETT holder        Medications Ordered in ED Medications  rocuronium bromide 100 MG/10ML SOSY (has no administration in time range)  etomidate (AMIDATE) 2 MG/ML injection (has no administration in time range)  nitroGLYCERIN 0.2 mg/mL in dextrose 5 % infusion (has no administration in time range)  propofol (DIPRIVAN) 1000 MG/100ML infusion (has no administration in time range)  propofol (DIPRIVAN) 1000 MG/100ML infusion (has no administration in time range)    ED Course/ Medical Decision Making/ A&P Clinical Course as of 09/15/21 0044  Wed  Sep 15, 2021  0043 Patient seen after arrival with cardiology Dr. Delanna Ahmadi at bedside.  Patient reports abrupt onset of shortness of breath, EMS reports patient was hypoxic and ill-appearing.  Initial prehospital EKG revealed mild anterior elevation though this is been seen previously.  After discussion with cardiology, since patient is clinically ill with EKG findings, he will take patient emergently to the cardiac Cath Lab.  Unfortunately patient is having evidence of respiratory failure and new oxygen requirement is high  risk for deterioration.  Patient will be emergently intubated and transferred to cardiac Cath Lab [DW]  [phone] Patient had some postprocedural hypotension which is improving with IV fluids.  Patient will be taken emergently to the Cath Lab [DW]    Clinical Course User Index [DW] Ripley Fraise, MD                           Medical Decision Making Amount and/or Complexity of Data Reviewed Labs: ordered. Radiology: ordered.  Risk Prescription drug management. Decision regarding hospitalization.   This patient presents to the ED for concern of shortness of breath, this involves an extensive number of treatment options, and is a complaint that carries with it a high risk of complications and morbidity.  The differential diagnosis includes but is not limited to ACS, pulmonary embolism, pulmonary edema, pneumonia, pneumothorax  Comorbidities that complicate the patient evaluation: Patient's presentation is complicated by their history of CAD, diabetes  Social Determinants of Health: Patient's  ongoing tobacco use   increases the complexity of managing their presentation  Additional history obtained: Additional history obtained from EMS discussed with EMS at bedside Records reviewed  previous cardiology notes reviewed  Imaging Studies ordered: I ordered imaging studies including X-ray chest   I independently visualized and interpreted imaging which showed pulmonary edema I agree with the radiologist interpretation  Cardiac Monitoring: The patient was maintained on a cardiac monitor.  I personally viewed and interpreted the cardiac monitor which showed an underlying rhythm of:  sinus rhythm   Critical Interventions:  Intubation and transfer to cardiac Cath Lab  Consultations Obtained: I requested consultation with the consultant Dr Virgina Jock cardiology , and discussed  findings as well as pertinent plan - they recommend: intubated and take to cardiac cath  lab  Reevaluation: After the interventions noted above, I reevaluated the patient and found that they have :stayed the same  Complexity of problems addressed: Patient's presentation is most consistent with  acute presentation with potential threat to life or bodily function  Disposition: After consideration of the diagnostic results and the patient's response to treatment,  I feel that the patent would benefit from admission   .   Patient was given aspirin prior to arrival.  She is already on anticoagulation, was not given heparin in the ER.  Nitroglycerin was withheld due to postprocedural hypotension        Final Clinical Impression(s) / ED Diagnoses Final diagnoses:  Acute ST elevation myocardial infarction (STEMI), unspecified artery ([place])  Acute respiratory failure with hypoxia [facility])    Rx / DC Orders ED Discharge Orders     None         Ripley Fraise, MD 09/15/21 0045

## 2021-09-15 NOTE — H&P (Addendum)
Alexandra Ward is an 61 y.o. female.   Chief Complaint: Shortness of breath HPI:   61 y/o Caucasian female  with hyperlipidemia, type 2 DM, h/o stroke (vertebral artery dissection 2018), STEMI (09/21/2020) treated with PPCI to prox LAD, HFrEF EF 30-35%, PAF, presented with acute shortness of breath  Patient had been doing fairly well without complaints till 6/13 late night when she had acute onset of shortness of breath. She denies chest pain, but also did not have clear chest pain with her MI in 09/2020. She had been compliant with her medications, but had pork chops outside 6 PM on 6/13.  She is still taking Aspirin, but not P2Y12i. She is on eliquis for Afib.  EMS and ER EKG showed old anterolateral infarct with ST elevation in V1-V3, that is different from her EKG on 09/01/2021. EMS SBP was 86 mmHg. BP now is 150s/80s. CXR shows flash pulmonary edema.   Patient is in acute distress with her dyspnea.   Past Medical History:  Diagnosis Date   Diabetes mellitus without complication (HCC)    Hyperlipidemia    Stroke [facility])    Vertebral artery dissection 2018    Past Surgical History:  Procedure Laterality Date   CORONARY THROMBECTOMY N/A 09/21/2020   Procedure: Coronary Thrombectomy;  Surgeon: Yates Decamp, MD;  Location: [facility] INVASIVE CV LAB;  Service: Cardiovascular;  Laterality: N/A;   CORONARY/GRAFT ACUTE MI REVASCULARIZATION N/A 09/21/2020   Procedure: Coronary/Graft Acute MI Revascularization;  Surgeon: Yates Decamp, MD;  Location: [facility] INVASIVE CV LAB;  Service: Cardiovascular;  Laterality: N/A;   LEFT HEART CATH AND CORONARY ANGIOGRAPHY N/A 09/21/2020   Procedure: LEFT HEART CATH AND CORONARY ANGIOGRAPHY;  Surgeon: Yates Decamp, MD;  Location: [facility] INVASIVE CV LAB;  Service: Cardiovascular;  Laterality: N/A;     Family History  Problem Relation Age of Onset   Heart disease Mother     Social History:  reports that she has been smoking cigarettes. She has never used smokeless tobacco. She  reports that she does not drink alcohol and does not use drugs.  Allergies:  Allergies  Allergen Reactions   Hydrocodone Nausea And Vomiting   Hydrocodone Nausea And Vomiting   Other Nausea And Vomiting and Other (See Comments)    States all pain medications make her very ill- vomits   Tyloxapol Nausea And Vomiting   Oxycodone-Acetaminophen Nausea And Vomiting   Prednisone Rash and Other (See Comments)    Chest pain and a short-lived rash on the neck    Review of Systems  Cardiovascular:  Positive for dyspnea on exertion. Negative for chest pain, leg swelling, palpitations and syncope.   Vitals:   09/15/21 0005  Pulse: 99   BP 158/86 mmHg  Physical Exam Vitals and nursing note reviewed.  Constitutional:      General: She is in acute distress.     Appearance: She is ill-appearing and diaphoretic.  Neck:     Vascular: No JVD.  Cardiovascular:     Rate and Rhythm: Regular rhythm. Tachycardia present.     Heart sounds: Normal heart sounds. No murmur heard. Pulmonary:     Effort: Pulmonary effort is normal.     Breath sounds: Normal breath sounds. No wheezing or rales.  Musculoskeletal:     Right lower leg: Edema (Trace) present.     Left lower leg: Edema (1_) present.      (Not in a hospital admission)    No current facility-administered medications for this encounter.  Current Outpatient  Medications:    amiodarone (PACERONE) 100 MG tablet, Take 1 tablet (100 mg total) by mouth daily., Disp: 30 tablet, Rfl: 3   apixaban (ELIQUIS) 5 MG TABS tablet, Take 1 tablet (5 mg total) by mouth 2 (two) times daily., Disp: 60 tablet, Rfl: 3   Artificial Tear Ointment (DRY EYES OP), Apply 1 drop to eye daily as needed (dry eyes)., Disp: , Rfl:    aspirin 81 MG chewable tablet, Chew 1 tablet (81 mg total) by mouth daily., Disp: 30 tablet, Rfl: 0   atorvastatin (LIPITOR) 80 MG tablet, TAKE 1 TABLET BY MOUTH DAILY AT 8pm, Disp: 30 tablet, Rfl: 3   carvedilol (COREG) 6.25 MG tablet,  TAKE 1 TABLET BY MOUTH TWICE DAILY, Disp: 60 tablet, Rfl: 2   furosemide (LASIX) 20 MG tablet, TAKE 1 TABLET BY MOUTH AS NEEDED FOR FLUID OR EDEMA, Disp: 30 tablet, Rfl: 3   losartan (COZAAR) 25 MG tablet, Take 0.5 tablets (12.5 mg total) by mouth daily., Disp: 45 tablet, Rfl: 3   LUMIFY 0.025 % SOLN, Place 1 drop into both eyes daily as needed (for irritation)., Disp: , Rfl:    metFORMIN (GLUCOPHAGE) 500 MG tablet, Take 500-1,000 mg by mouth See admin instructions. 1000 mg in the morning 500 mg in the evening, Disp: , Rfl:    potassium chloride (KLOR-CON) 10 MEQ tablet, Take by mouth., Disp: , Rfl:    Today's Vitals   09/15/21 0005  Pulse: 99   There is no height or weight on file to calculate BMI.   Lab Results: Pending    Tests ordered:  Lab Orders         Resp Panel by RT-PCR (Flu A&B, Covid) Anterior Nasal Swab         Hemoglobin A1c         CBC with Differential/Platelet         Protime-INR         APTT         Comprehensive metabolic panel         Lipid panel       Imaging/tests reviewed and independently interpreted: Cardiac Studies:  EKG 09/15/2021: Sinus rhythm Anterolateral infarct, age indeterminate STE different from EKG on 09/01/2021  CXR 09/15/2021: Pulmonary edema  Echocardiogram 03/10/2021: Left ventricle cavity is normal in size. Normal left ventricular wall  thickness. Left ventricle regional wall motion findings: Mid anteroseptal,  Mid anterior, Mid inferoseptal, Apical anterior, Apical lateral, Apical  inferior, Apical septal and Apical cap akinesis. LVEF 30-35%. Doppler  evidence of grade III (restrictive) diastolic dysfunction, elevated LAP.  Calculated EF 49%.  Left atrial cavity is mildly dilated.  Mild to moderate mitral regurgitation.  Moderate tricuspid regurgitation. Estimated pulmonary artery systolic  pressure 45 mmHg.  Compared to previous study on 09/21/2020, mitral and tricuspid  regurgitation, pulmonary hypertension are new  findings.    Assessment & Recommendations:    61 y/o Caucasian female  with hyperlipidemia, type 2 DM, h/o stroke (vertebral artery dissection 2018), STEMI (09/21/2020) treated with PPCI to prox LAD, HFrEF EF 30-35%, PAF, presented with acute shortness of breath  Acute respiratory failure: Flash pulmonary edema. ST in anterior leads do look different from baseline, in spite of underlying anterolateral infarct LAD stent thrombosis is a concern I do recommend emergent coronary angiography and possible intervention. However, her respiratory status is unstable at this point with her increased work of breathing and minimal air entry b/l. I discussed with patient and ER physician Dr Bebe Shaggy. We  decided to proceed with intubation and mechanical ventilation prior to coronary angiography, as this is safer option.  Consented for cardiac catheterization, and possible intervention We discussed regarding risks, benefits, alternatives to this including stress testing, CTA and continued medical therapy. Patient wants to proceed. Understands <1-2% risk of death, stroke, MI, urgent CABG, bleeding, infection, renal failure but not limited to these.  CRITICAL CARE Performed by: Truett MainlandManish Damante Spragg   Total critical care time: 30 minutes   Critical care time was exclusive of separately billable procedures and treating other patients.   Critical care was necessary to treat or prevent imminent or life-threatening deterioration.   Critical care was time spent personally by me on the following activities: development of treatment plan with patient and/or surrogate as well as nursing, discussions with consultants, evaluation of patient's response to treatment, examination of patient, obtaining history from patient or surrogate, ordering and performing treatments and interventions, ordering and review of laboratory studies, ordering and review of radiographic studies, pulse oximetry and re-evaluation of patient's  condition.       Elder NegusManish J Katria Botts, MD Pager: [phone] Office: [phone]

## 2021-09-15 NOTE — Progress Notes (Signed)
RT NOTE:    Pt transported to CT without event. Report given to Makena, RT.

## 2021-09-15 NOTE — Telephone Encounter (Signed)
Pt called and stated that Dr. Virgina Jock visitted her in the hospital since she was admitted last night. She wanted to make you aware.

## 2021-09-15 NOTE — Interval H&P Note (Signed)
History and Physical Interval Note:  09/15/2021 12:46 AM  Alexandra Ward  has presented today for surgery, with the diagnosis of STEMI.  The various methods of treatment have been discussed with the patient and family. After consideration of risks, benefits and other options for treatment, the patient has consented to  Procedure(s): Coronary/Graft Acute MI Revascularization (N/A) as a surgical intervention.  The patient's history has been reviewed, patient examined, no change in status, stable for surgery.  I have reviewed the patient's chart and labs.  Questions were answered to the patient's satisfaction.    2016 Appropriate Use Criteria for Coronary Revascularization in Patients With Acute Coronary Syndrome Patient Information: Revascularization Strategy Immediate Revascularization by PCI  Time since STEMI symptom onset <=12 hours  Indication:  Revascularization of the Presumed STEMI Culprit Artery by Primary PCI <=12 hours after symptom onset A (9) Indication: 1; Score 9 10319 Indication:  Immediate Revascularization of 1 or more Non-culprit Arteries during the Same Procedure Following Successful Treatment of the STEMI Culprit Artery by Primary PCI.  PCI or CABG of 1 or more additional vessels for Cardiogenic Shock Persisting after PCI of the presumed culprit artery A (8) Indication: 4; Score 8 10320 Indication:  Immediate Revascularization of 1 or more Non-culprit Arteries during the Same Procedure Following Successful Treatment of the STEMI Culprit Artery by Primary PCI.  Revascularization of 1 or more additional severe (>=70% DS) stenoses in a stable patient immediately following PCI of the presumed culprit artery M (6) Indication: 5; Score 6 10321 Indication:  Immediate Revascularization of 1 or more Non-culprit Arteries during the Same Procedure Following Successful Treatment of the STEMI Culprit Artery by Primary PCI.  Revascularization of 1 or more additional  intermediate (50-69% DS) stenoses in a stable patient immediately following PCI of the presumed culprit artery M (4) Indication: 6; Score 4   Morghan Kester J Shawny Borkowski

## 2021-09-15 NOTE — Progress Notes (Signed)
   09/15/21 1630  Clinical Encounter Type  Visited With Patient and family together  Visit Type Initial;Critical Care  Referral From Nurse  Consult/Referral To Chaplain Albertina Parr [place])   Met with Alexandra Ward. [name] and her son, Alexandra Ward, at patient's bedside. Chaplain provided meaningful presence and reflective listening. At patient's request we shared in intercessory prayer. [address] [place], [name] [phone]

## 2021-09-15 NOTE — Progress Notes (Signed)
Alexandra Ward was received by the RN from Cath Lab on a Ventilator due to Respiratory distress. Purewick placed due to lasix administration in cath lab and no foley. Bathed by the RN with CHG. RAC PIV malpositioned and stopped working. New 20g PIV placed in Left FA with blood return noted. IV sedation started per order. Pt BP became hypotensive and labile while on the gtt for sedation and eventually Norepinephrine gtt was added per order of Dr. Rosemary Holms. Alexandra Ward continued to be labile and would have increased agitation when sedation decreased due to BP. Goal to extubated in the AM. Family updated by the RN and are at the bedside. The RN updated Dr. Adaline Sill in the AM and gave report to the day shift RN.

## 2021-09-15 NOTE — TOC Initial Note (Signed)
Transition of Care [facility]) - Initial/Assessment Note    Patient Details  Name: Alexandra Ward MRN: [id number] Date of Birth: 11/16/1960  Transition of [facility]) CM/SW Contact:    Bethena Roys, RN Phone Number: 09/15/2021, 12:21 PM  Clinical Narrative: Patient presented for shortness of breath-chest tube in place. PTA patient states she is from home with family support. Family is at the bedside during the conversation. Patient is currently without insurance; however, she pays out of pocket for PCP Dr. Manuella Ghazi in [place]. Case Manager will continue to follow for [facility] assistance needs based on discharge medications and additional disposition needs.                          Expected Discharge Plan: [place] Barriers to Discharge: Continued Medical Work up   Patient Goals and CMS Choice Patient states their goals for this hospitalization and ongoing recovery are:: Patient wants to return home.      Expected Discharge Plan and Services Expected Discharge Plan: [place]   Discharge Planning Services: CM Consult Post Acute Care Choice: Home Health Living arrangements for the past 2 months: Apartment                   DME Agency: NA     Prior Living Arrangements/Services Living arrangements for the past 2 months: Apartment Lives with:: Relatives, Self Patient language and need for interpreter reviewed:: Yes Do you feel safe going back to the place where you live?: Yes      Need for Family Participation in Patient Care: Yes (Comment) Care giver support system in place?: Yes (comment)   Criminal Activity/Legal Involvement Pertinent to Current Situation/Hospitalization: No - Comment as needed   Permission Sought/Granted Permission sought to share information with : Family Supports, Case Manager   Emotional Assessment Appearance:: Appears stated age Attitude/Demeanor/Rapport: Engaged Affect (typically observed):  Appropriate Orientation: : Oriented to Self, Oriented to Place Alcohol / Substance Use: Not Applicable Psych Involvement: No (comment)  Admission diagnosis:  Acute respiratory failure with hypoxia ([place]) [J96.01] Acute ST elevation myocardial infarction (STEMI), unspecified artery ([place]) [I21.3] Pulmonary edema [J81.1] Patient Active Problem List   Diagnosis Date Noted   Pulmonary edema 09/15/2021   Acute respiratory failure with hypoxia ([place])    Secondary spontaneous pneumothorax    Atrial fibrillation with rapid ventricular response ([place]) 09/28/2020   CAD (coronary artery disease), native coronary artery 09/21/2020   Hypercholesteremia 09/21/2020   Type 2 diabetes mellitus without complication, without long-term current use of insulin ([place]) 09/21/2020   Acute ST elevation myocardial infarction (STEMI) ([place]) 09/21/2020   Acute ischemic stroke ([place]) 11/23/2016   Tobacco abuse 11/23/2016   PCP:  Monico Blitz, MD Pharmacy:   [facility] [id number] [place] [id number] Phone: [phone] Fax: [phone]  [facility] [address] [place] [address] Phone: [phone] Fax: [phone]     Readmission Risk Interventions     No data to display

## 2021-09-15 NOTE — ED Notes (Signed)
Patient to Cath Lab with RN, Cath lab staff and MD.

## 2021-09-15 NOTE — Telephone Encounter (Signed)
Patient called the on-call phone and left a message line.  Message reads:  "Meds were changed and patient was not notified. Wants to know why there is a change. Called severeal times and still hasn't heard back, please call."  I tried calling patient back to see if I can get more information, but she did not answer.

## 2021-09-15 NOTE — Progress Notes (Signed)
An IV has been placed with catheter tip verified by [place] for short-term vasopressor infusion. A correctly placed ivWatch must be used when administering Vasopressors. Should this treatment be needed beyond 72 hours, central line access should be obtained.  It will be the responsibility of the bedside nurse to follow best practice to prevent extravasations.

## 2021-09-16 ENCOUNTER — Inpatient Hospital Stay (HOSPITAL_COMMUNITY): Payer: Self-pay

## 2021-09-16 DIAGNOSIS — I5032 Chronic diastolic (congestive) heart failure: Secondary | ICD-10-CM

## 2021-09-16 DIAGNOSIS — I5020 Unspecified systolic (congestive) heart failure: Secondary | ICD-10-CM

## 2021-09-16 LAB — ECHOCARDIOGRAM COMPLETE
Area-P 1/2: 3.08 cm2
Calc EF: 36.5 %
Height: 64 in
S' Lateral: 4.5 cm
Single Plane A2C EF: 25 %
Single Plane A4C EF: 44.9 %

## 2021-09-16 LAB — GLUCOSE, CAPILLARY
Glucose-Capillary: 200 mg/dL — ABNORMAL HIGH (ref 70–99)
Glucose-Capillary: 163 mg/dL — ABNORMAL HIGH (ref 70–99)
Glucose-Capillary: 170 mg/dL — ABNORMAL HIGH (ref 70–99)
Glucose-Capillary: 142 mg/dL — ABNORMAL HIGH (ref 70–99)
Glucose-Capillary: 113 mg/dL — ABNORMAL HIGH (ref 70–99)

## 2021-09-16 LAB — CYTOLOGY - NON PAP

## 2021-09-16 LAB — BASIC METABOLIC PANEL
Anion gap: 12 (ref 5–15)
BUN: 19 mg/dL (ref 8–23)
CO2: 30 mmol/L (ref 22–32)
Calcium: 8.9 mg/dL (ref 8.9–10.3)
Chloride: 96 mmol/L — ABNORMAL LOW (ref 98–111)
Creatinine, Ser: 1.17 mg/dL — ABNORMAL HIGH (ref 0.44–1.00)
GFR, Estimated: 53 mL/min — ABNORMAL LOW (ref >60)
Glucose, Bld: 195 mg/dL — ABNORMAL HIGH (ref 70–99)
Potassium: 3 mmol/L — ABNORMAL LOW (ref 3.5–5.1)
Sodium: 138 mmol/L (ref 135–145)

## 2021-09-16 LAB — MAGNESIUM: Magnesium: 1.9 mg/dL (ref 1.7–2.4)

## 2021-09-16 LAB — LIPOPROTEIN A (LPA): Lipoprotein (a): 11.4 nmol/L (ref <75.0)

## 2021-09-16 MED ORDER — NICOTINE 21 MG/24HR TD PT24
21.0000 mg | MEDICATED_PATCH | Freq: Every day | TRANSDERMAL | Status: DC
  Administered 2021-09-16 – 2021-09-17 (×2): 21 mg via TRANSDERMAL
  Filled 2021-09-16 (×2): qty 1

## 2021-09-16 MED ORDER — SODIUM CHLORIDE 0.9 % IV SOLN: INTRAVENOUS | Status: DC | PRN

## 2021-09-16 MED ORDER — PERFLUTREN LIPID MICROSPHERE
1.0000 mL | INTRAVENOUS | Status: AC | PRN
  Administered 2021-09-16: 2 mL via INTRAVENOUS

## 2021-09-16 MED ORDER — POTASSIUM CHLORIDE CRYS ER 20 MEQ PO TBCR
60.0000 meq | EXTENDED_RELEASE_TABLET | ORAL | Status: DC
  Filled 2021-09-16: qty 3

## 2021-09-16 MED ORDER — MAGNESIUM SULFATE 2 GM/50ML IV SOLN
2.0000 g | Freq: Once | INTRAVENOUS | Status: AC
Start: 2021-09-16 — End: 2021-09-16
  Administered 2021-09-16: 2 g via INTRAVENOUS
  Filled 2021-09-16: qty 50

## 2021-09-16 MED ORDER — POTASSIUM CHLORIDE 10 MEQ/100ML IV SOLN
10.0000 meq | INTRAVENOUS | Status: AC
  Administered 2021-09-16 – 2021-09-17 (×6): 10 meq via INTRAVENOUS
  Filled 2021-09-16 (×6): qty 100

## 2021-09-16 MED ORDER — MIDODRINE HCL 5 MG PO TABS
5.0000 mg | ORAL_TABLET | Freq: Three times a day (TID) | ORAL | Status: DC
  Administered 2021-09-16 – 2021-09-17 (×4): 5 mg via ORAL
  Filled 2021-09-16 (×4): qty 1

## 2021-09-16 MED FILL — Lidocaine HCl Local Preservative Free (PF) Inj 1%: INTRAMUSCULAR | Qty: 30 | Status: AC

## 2021-09-16 NOTE — Progress Notes (Signed)
NAME:  Alexandra Ward, MRN:  [id number], DOB:  Apr 06, 1960, LOS: 1 ADMISSION DATE:  09/15/2021 CONSULTATION DATE:  09/15/2021 REFERRING MD:  Rosemary Holms - Cards CHIEF COMPLAINT:  Post-procedure vent management   History of Present Illness:  61 year old woman who presented to [facility] ED 6/14 with SOB, Code STEMI. PMHx significant for HLD, CAD with STEMI (09/2020, s/p PCI to prox LAD), HFrEF (Echo 03/2021 with EF 30-35%, +RWMAs, G3DD, new MR/TR), Afib (on Eliquis), CVA (vertebral artery dissection 2018), T2DM, likely COPD, tobacco abuse.  History is obtained from chart review. Presented to [facility] ED 6/13 for acute onset of SOB, at the time of presentation denied CP. Reports medication compliance. Takes ASA and Eliquis for Afib, Plavix discontinued. EKG was obtained demonstrating old anterolateral infarct with ST elevation in leads V1-V3 (changes since 5/31) with concern for prior placed LAD stent thrombosis. CXR demonstrating flash pulmonary edema. Intubated to allow for airway protection in the setting of edema and need for coronary angiography. Underwent cardiac cath with Dr. Rosemary Holms 6/14AM with findings of patent LAD stent, known mid RCA CTO. Patient was transferred to [facility] post-procedure.  PCCM consulted for assistance with vent management/respiratory status post-procedure.  Pertinent Medical History:   Past Medical History:  Diagnosis Date   Diabetes mellitus without complication (HCC)    Hyperlipidemia    Stroke [facility])    Vertebral artery dissection 2018   Significant Hospital Events: Including procedures, antibiotic start and stop dates in addition to other pertinent events   6/14 - Presented to [facility] ED via EMS for acute onset SOB. EKG - prior known infarct but with EKG changes/ST elevation in V1-V3 c/f STEMI/prior LAD stent thrombosis. CXR with flash pulm edema. Intubated for airway protection. Taken to cath lab, no STEMI/stent thrombosis. Transfer to 2H. PCCM consulted for vent management. 6/14  extubated, pigtail R chest  Interim History / Subjective:  No events. Some orthostasis on sitting up yesterday. ~300cc output from chest tube.  Objective:  Blood pressure (!) 122/51, pulse 60, temperature 99 F (37.2 C), temperature source Oral, resp. rate 18, height 5\' 4"  (1.626 m), SpO2 94 %.    Vent Mode: BIPAP FiO2 (%):  [40 %] 40 %   Intake/Output Summary (Last 24 hours) at 09/16/2021 0829 Last data filed at 09/16/2021 0700 Gross per 24 hour  Intake 466.86 ml  Output 3450 ml  Net -2983.14 ml    There were no vitals filed for this visit.  Physical Examination: No distress Took her to RA, sats in low 90s Chest tube without tidaling or air leak Ext warm Lungs more clear today No edema  CXR has cleared up nicely, small apical air pocket on R  Pleural fluid looks benign although slightly neutrophil predominant ; cyto pending  Resolved Hospital Problem List:    Assessment & Plan:  Flash pulmonary edema- resolved with diuresis, CXR now clear, on RA R hydropneumothorax- R effusion likely related to volume overload, PTX related to barotrauma from coughing on vent CAD- LAD stent open, chronic RCA occlusion with collaterals on cath this admit Ischemic cardiomyopathy- now euvolemic to hypovolemic pAFon AC- in sinus here, normal rates History of CVA (vertebral dissection 2018) Hx smoking DM2 with hyperglycemia HLD  - Encourage IS - Hold further diuresis - Clamp chest tube, 1PM CXR, if stable DC chest tube - Ambulate, add midodrine given orthostasis and levophed requirement - Levophed to MAP 65 - Should Bps rebound, dc midodrine and start GDMT - Nicotine patch, consider LDCT lung ca screening referral -  f/u pleural fluid cytology - Continue eliquis  Best Practice: (right click and "Reselect all SmartList Selections" daily)   Best practice:  Diet: diabetic Pain/Anxiety/Delirium protocol (if indicated): N/A VAP protocol (if indicated): N/A DVT prophylaxis:  eliquis GI prophylaxis: N/A Glucose control: SSI Mobility: Progress as tolerated Code Status: full Family Communication: updated at bedside Disposition: Remains in ICU while on pressors  Critical care time: 33 minutes   Lorin Glass, MD [place] 09/16/21 8:29 AM  Please see [email or web address] for pager details.  From 7A-7P if no response, please call [phone] After hours, please call ELink [phone]

## 2021-09-16 NOTE — Progress Notes (Signed)
Inpatient Diabetes Program Recommendations  AACE/ADA: New Consensus Statement on Inpatient Glycemic Control (2015)  Target Ranges:  Prepandial:   less than 140 mg/dL      Peak postprandial:   less than 180 mg/dL (1-2 hours)      Critically ill patients:  140 - 180 mg/dL   Lab Results  Component Value Date   GLUCAP 200 (H) 09/16/2021   HGBA1C 6.0 (H) 09/15/2021    Review of Glycemic Control  Latest Reference Range & Units 09/15/21 16:42 09/15/21 21:37 09/16/21 06:38  Glucose-Capillary 70 - 99 mg/dL 168 (H) 372 (H) 902 (H)  (H): Data is abnormally high Diabetes history: Type 2 DM Outpatient Diabetes medications: Metformin 500 mg BID Current orders for Inpatient glycemic control: Novolog 0-15 units TID & HS, Novolog 4 units TID  Inpatient Diabetes Program Recommendations:    Consider adding Levemir 8 units QD and adding carb modified to diet.   Thanks, Lujean Rave, MSN, RNC-OB Diabetes Coordinator [phone] (8a-5p)

## 2021-09-16 NOTE — Progress Notes (Signed)
Subjective:  Feels well. Breathing is improved.  Objective:  Vital Signs in the last 24 hours: Temp:  [97.9 F (36.6 C)-99 F (37.2 C)] 99 F (37.2 C) (06/14 2300) Pulse Rate:  [55-70] 66 (06/15 0715) Resp:  [11-24] 15 (06/15 0715) BP: (74-153)/(29-91) 105/76 (06/15 0715) SpO2:  [84 %-100 %] 84 % (06/15 0715) FiO2 (%):  [40 %] 40 % (06/14 0919)  Intake/Output from previous day: 06/14 0701 - 06/15 0700 In: 590.5 [P.O.:240; I.V.:350.5] Out: 3450 [Urine:2600; Chest Tube:850]  Physical Exam Vitals and nursing note reviewed.  Constitutional:      General: She is not in acute distress. Neck:     Vascular: No JVD.  Cardiovascular:     Rate and Rhythm: Normal rate and regular rhythm.     Heart sounds: Normal heart sounds. No murmur heard. Pulmonary:     Effort: Pulmonary effort is normal.     Breath sounds: Normal breath sounds. No wheezing or rales.  Chest:     Comments: Right-sided chest tube in place  Musculoskeletal:     Right lower leg: No edema.     Left lower leg: No edema.     Imaging/tests reviewed and independently interpreted:  Cardiac Studies:  Telemetry 09/16/2021: No significant arrhythmia  Echocardiogram: Pending   Assessment & Recommendations:  61 y/o Caucasian female  with hyperlipidemia, type 2 DM, h/o stroke (vertebral artery dissection 2018), STEMI (09/21/2020) treated with PPCI to prox LAD, HFrEF EF 30-35%, PAF, admitted with acute hypoxic respiratory failure  Acute hypoxic respiratory failure: Presentation with flash pulmonary edema, acute hypoxic respiratory failure, with subtle EKG changes concerning for STEMI. Coronary angiogram on 09/15/2021 showed no acute occlusion. Flash pulmonary edema, as well as spontaneous right-sided pneumothorax likely etiology. Diuresing well.  Now has chest tube in place. Echocardiogram pending.  Given continued requirement of norepinephrine, home heart failure currently to therapy medications currently on  hold. Also discontinued Lasix today given suspicion of possible orthostatic hypotension.  CAD: Patent LAD stent, known RCA CTO. Continue aspirin, statin.  CRITICAL CARE Performed by: Truett Mainland   Total critical care time: 30 minutes   Critical care time was exclusive of separately billable procedures and treating other patients.   Critical care was necessary to treat or prevent imminent or life-threatening deterioration.   Critical care was time spent personally by me on the following activities: development of treatment plan with patient and/or surrogate as well as nursing, discussions with consultants, evaluation of patient's response to treatment, examination of patient, obtaining history from patient or surrogate, ordering and performing treatments and interventions, ordering and review of laboratory studies, ordering and review of radiographic studies, pulse oximetry and re-evaluation of patient's condition.     Discussed interpretation of tests and management recommendations with the primary team   Alexandra Negus, MD Pager: [phone] Office: [phone]

## 2021-09-16 NOTE — Plan of Care (Signed)

## 2021-09-17 ENCOUNTER — Encounter (HOSPITAL_COMMUNITY): Payer: Self-pay

## 2021-09-17 ENCOUNTER — Other Ambulatory Visit: Payer: Self-pay

## 2021-09-17 ENCOUNTER — Other Ambulatory Visit (HOSPITAL_COMMUNITY): Payer: Self-pay

## 2021-09-17 DIAGNOSIS — I480 Paroxysmal atrial fibrillation: Secondary | ICD-10-CM | POA: Diagnosis present

## 2021-09-17 DIAGNOSIS — I5020 Unspecified systolic (congestive) heart failure: Secondary | ICD-10-CM

## 2021-09-17 DIAGNOSIS — I10 Essential (primary) hypertension: Secondary | ICD-10-CM | POA: Diagnosis present

## 2021-09-17 LAB — BASIC METABOLIC PANEL
Anion gap: 10 (ref 5–15)
BUN: 12 mg/dL (ref 8–23)
CO2: 27 mmol/L (ref 22–32)
Calcium: 8.5 mg/dL — ABNORMAL LOW (ref 8.9–10.3)
Chloride: 102 mmol/L (ref 98–111)
Creatinine, Ser: 0.76 mg/dL (ref 0.44–1.00)
GFR, Estimated: >60 mL/min (ref >60)
Glucose, Bld: 98 mg/dL (ref 70–99)
Potassium: 4.1 mmol/L (ref 3.5–5.1)
Sodium: 139 mmol/L (ref 135–145)

## 2021-09-17 LAB — CBC
HCT: 36.2 % (ref 36.0–46.0)
Hemoglobin: 11.9 g/dL — ABNORMAL LOW (ref 12.0–15.0)
MCH: 29 pg (ref 26.0–34.0)
MCHC: 32.9 g/dL (ref 30.0–36.0)
MCV: 88.1 fL (ref 80.0–100.0)
Platelets: 252 10*3/uL (ref 150–400)
RBC: 4.11 MIL/uL (ref 3.87–5.11)
RDW: 14.2 % (ref 11.5–15.5)
WBC: 13 10*3/uL — ABNORMAL HIGH (ref 4.0–10.5)
nRBC: 0 % (ref 0.0–0.2)

## 2021-09-17 LAB — GLUCOSE, CAPILLARY: Glucose-Capillary: 120 mg/dL — ABNORMAL HIGH (ref 70–99)

## 2021-09-17 MED ORDER — DAPAGLIFLOZIN PROPANEDIOL 10 MG PO TABS
10.0000 mg | ORAL_TABLET | Freq: Every day | ORAL | 2 refills | Status: DC
  Filled 2021-09-17: qty 30, 30d supply, fill #0

## 2021-09-17 MED ORDER — MIDODRINE HCL 5 MG PO TABS
5.0000 mg | ORAL_TABLET | Freq: Three times a day (TID) | ORAL | 0 refills | Status: DC
  Filled 2021-09-17: qty 90, 30d supply, fill #0

## 2021-09-17 MED ORDER — PANTOPRAZOLE SODIUM 40 MG PO TBEC: 40.0000 mg | DELAYED_RELEASE_TABLET | Freq: Every day | ORAL | 2 refills | Status: DC

## 2021-09-17 MED ORDER — AMIODARONE HCL 100 MG PO TABS: 100.0000 mg | ORAL_TABLET | Freq: Every day | ORAL | 2 refills | Status: DC

## 2021-09-17 MED ORDER — ASPIRIN 81 MG PO CHEW
81.0000 mg | CHEWABLE_TABLET | Freq: Every day | ORAL | 0 refills | Status: DC
  Filled 2021-09-17: qty 30, 30d supply, fill #0

## 2021-09-17 MED ORDER — DAPAGLIFLOZIN PROPANEDIOL 10 MG PO TABS: 10.0000 mg | ORAL_TABLET | Freq: Every day | ORAL | 2 refills | Status: DC

## 2021-09-17 MED ORDER — PANTOPRAZOLE SODIUM 40 MG PO TBEC
40.0000 mg | DELAYED_RELEASE_TABLET | Freq: Every day | ORAL | 2 refills | Status: DC
  Filled 2021-09-17 – 2021-12-10 (×2): qty 30, 30d supply, fill #0

## 2021-09-17 MED ORDER — CARVEDILOL 6.25 MG PO TABS
3.1250 mg | ORAL_TABLET | Freq: Two times a day (BID) | ORAL | 2 refills | Status: DC
  Filled 2021-09-17: qty 30, 30d supply, fill #0

## 2021-09-17 MED ORDER — NICOTINE 21 MG/24HR TD PT24
21.0000 mg | MEDICATED_PATCH | Freq: Every day | TRANSDERMAL | 3 refills | Status: DC
Start: 2021-09-18 — End: 2022-04-14
  Filled 2021-09-17 – 2021-12-10 (×2): qty 28, 28d supply, fill #0

## 2021-09-17 MED ORDER — DAPAGLIFLOZIN PROPANEDIOL 10 MG PO TABS
10.0000 mg | ORAL_TABLET | Freq: Every day | ORAL | Status: DC
  Administered 2021-09-17: 10 mg via ORAL
  Filled 2021-09-17: qty 1

## 2021-09-17 MED ORDER — MIDODRINE HCL 5 MG PO TABS: 5.0000 mg | ORAL_TABLET | Freq: Three times a day (TID) | ORAL | 0 refills | Status: DC

## 2021-09-17 MED ORDER — NICOTINE 21 MG/24HR TD PT24: 21.0000 mg | MEDICATED_PATCH | Freq: Every day | TRANSDERMAL | 3 refills | Status: DC

## 2021-09-17 MED ORDER — APIXABAN 5 MG PO TABS
5.0000 mg | ORAL_TABLET | Freq: Two times a day (BID) | ORAL | 3 refills | Status: DC
  Filled 2021-09-17 – 2021-12-10 (×2): qty 60, 30d supply, fill #0

## 2021-09-17 NOTE — Assessment & Plan Note (Signed)
-  Continue statins ?

## 2021-09-17 NOTE — Assessment & Plan Note (Signed)
-   In the setting of acute on chronic systolic heart failure with flash pulmonary edema and a spontaneous pneumothorax. -Respiratory status back to baseline and hypoxia resolved -Patient not requiring oxygen supplementation at discharge.

## 2021-09-17 NOTE — Assessment & Plan Note (Signed)
-   Sinus rhythm currently -Rate controlled -Continue amiodarone and Eliquis.

## 2021-09-17 NOTE — Assessment & Plan Note (Signed)
-   Decompensated at time of admission with flash pulmonary edema. -Discharge stabilize volume; no requiring oxygen supplementation and hemodynamically stable. -Resume GDMT as per cardiology recommendation.

## 2021-09-17 NOTE — Progress Notes (Signed)
Okay to resume losartan 12.5 mg on discharge. Hold coreg until clinic f/u Added Farxiga 10 mg daily. Lasix only for prn use   Elder Negus, MD Pager: [phone] Office: [phone]

## 2021-09-17 NOTE — Progress Notes (Signed)
Pt set bed alarm off while brushing her teeth. Bed alarm reset. Pt set alarm off again, and was standing beside bed. Pt had not yet gotten out of bed since being transferred from ICU. Pt was instructed to call out when getting oob. Pt refusing bed alarm and stated, "I am alert and have never fallen don't set it again." Pt educated on the importance of calling before getting oob. Pt currently in bed at lowest position with call bell in reach.

## 2021-09-17 NOTE — Assessment & Plan Note (Signed)
-   Elevation of patient's troponin felt to be secondary to pulmonary edema, CHF exacerbation and spontaneous pneumothorax. -Cardiac cath demonstrating patent arteries and no acute abnormalities. -Continue risk factor modifications and outpatient follow-up with cardiology service.

## 2021-09-17 NOTE — Assessment & Plan Note (Signed)
-   Current dressing use of pressure supports in the setting of hypertension -At discharge we will continue low-dose of midodrine with close follow-up of patient's vital signs. -Okay to continue current use of adjusted dose of losartan and carvedilol.

## 2021-09-17 NOTE — Assessment & Plan Note (Signed)
-   Status post chest tube -Resolved and not requiring oxygen supplementation at discharge.

## 2021-09-17 NOTE — Progress Notes (Signed)
Mobility Specialist Progress Note:   09/17/21 0900  Mobility  Activity Ambulated with assistance in hallway  Level of Assistance Independent after set-up  Assistive Device None  Distance Ambulated (ft) 400 ft  Activity Response Tolerated well  $Mobility charge 1 Mobility   Pt received in bed willing to participate in mobility. No complaints of pain. Left in bed with call bell in reach and all needs met.   [facility] Alexandra Ward Mobility Specialist

## 2021-09-17 NOTE — TOC Transition Note (Addendum)
Transition of Care [facility]) - CM/SW Discharge Note   Patient Details  Name: Alexandra Ward MRN: [id number] Date of Birth: [birth year]  [facility] CM/SW Contact:  Leone Haven, RN Phone Number: 09/17/2021, 10:49 AM   Clinical Narrative:    Patient is for dc today, she has no insurance, meds sent to [facility], NCM will assist patient with meds with [employer].  She states her son will transport her home today. NCM checked with [facility] where scripts were sent, they states meds are 525.00, gave her [employer] information and this did not help.  NCM asked MD to to have TOC fill the meds for patient. NCM asked patient if she would like to go to [facility] clinic instead of her PCP, she states yes.  NCM made follow up apt for patient on AVS.      Barriers to Discharge: Continued Medical Work up   Patient Goals and CMS Choice Patient states their goals for this hospitalization and ongoing recovery are:: Patient wants to return home.      Discharge Placement                       Discharge Plan and Services   Discharge Planning Services: CM Consult Post Acute Care Choice: Home Health            DME Agency: NA                  Social Determinants of Health (SDOH) Interventions     Readmission Risk Interventions     No data to display

## 2021-09-17 NOTE — Progress Notes (Signed)
SATURATION QUALIFICATIONS: (This note is used to comply with regulatory documentation for home oxygen)  Patient Saturations on Room Air at Rest = 93%  Patient Saturations on Room Air while Ambulating = 90%  Patient Saturations on n/a Liters of oxygen while Ambulating = n/a%

## 2021-09-17 NOTE — Progress Notes (Signed)
Patient discharging home with her son at this time. Vital signs stable. Medications are being picked up at the [facility] pharmacy. Pt is aware of where she is to go for follow up appointments and when.   Education was given to patient about her medications and follow up appointments. Plan of care was adequate for discharge home.

## 2021-09-17 NOTE — Progress Notes (Signed)
Subjective:  Feels well. Breathing is improved. Chest tube is out.  Objective:  Vital Signs in the last 24 hours: Temp:  [97.8 F (36.6 C)-98.9 F (37.2 C)] 98.8 F (37.1 C) (06/16 0802) Pulse Rate:  [60-69] 66 (06/16 0802) Resp:  [12-21] 21 (06/16 0802) BP: (103-135)/(47-99) 120/47 (06/16 0802) SpO2:  [89 %-97 %] 92 % (06/16 0802) Weight:  [77 kg] 77 kg (06/16 0411)  Intake/Output from previous day: 06/15 0701 - 06/16 0700 In: 225.5 [P.O.:200; I.V.:13.9; IV Piggyback:11.7] Out: 800 [Urine:800]  Physical Exam Vitals and nursing note reviewed.  Constitutional:      General: She is not in acute distress. Neck:     Vascular: No JVD.  Cardiovascular:     Rate and Rhythm: Normal rate and regular rhythm.     Heart sounds: Normal heart sounds. No murmur heard. Pulmonary:     Effort: Pulmonary effort is normal.     Breath sounds: Normal breath sounds. No wheezing or rales.  Chest:     Comments:   Musculoskeletal:     Right lower leg: No edema.     Left lower leg: No edema.     Imaging/tests reviewed and independently interpreted:  Cardiac Studies:  Telemetry 09/17/2021: No significant arrhythmia  Echocardiogram 09/16/2021:  1. Left ventricular ejection fraction, by estimation, is 25 to 30%. The  left ventricle has severely decreased function. The left ventricle  demonstrates regional wall motion abnormalities (see scoring  diagram/findings for description). The left  ventricular internal cavity size was mildly dilated. Left ventricular  diastolic parameters are indeterminate. There is severe hypokinesis of the  left ventricular, entire anteroseptal wall and anterior wall. There is  akinesis of the left ventricular,  apical segment.   2. Right ventricular systolic function is normal. The right ventricular  size is normal.   3. The mitral valve is normal in structure. Mild mitral valve  regurgitation. No evidence of mitral stenosis.   4. The aortic valve is normal  in structure. Aortic valve regurgitation is  not visualized. No aortic stenosis is present.   Comparison(s): Previous outpatient study on 03/10/2021 reported PASP 45  mmHg, grade III DD, mild LA dilatation-not appreciated on this study. EF  reported 30-35% then.   Assessment & Recommendations:  61 y/o Caucasian female  with hyperlipidemia, type 2 DM, h/o stroke (vertebral artery dissection 2018), STEMI (09/21/2020) treated with PPCI to prox LAD, HFrEF EF 30-35%, PAF, admitted with acute hypoxic respiratory failure  Acute hypoxic respiratory failure: Presentation with flash pulmonary edema, acute hypoxic respiratory failure, with subtle EKG changes concerning for STEMI. Coronary angiogram on 09/15/2021 showed no acute occlusion. Flash pulmonary edema, as well as spontaneous right-sided pneumothorax likely etiology. Both resolved now.  Okay to resume losartan 12.5 mg on discharge. Hold coreg until clinic f/u Added Farxiga 10 mg daily. Lasix only for prn use   CAD: Patent LAD stent, known RCA CTO. Continue aspirin, statin.   F/u arranged.   Alexandra Negus, MD Pager: [phone] Office: [phone]

## 2021-09-17 NOTE — Discharge Summary (Signed)
Physician Discharge Summary   Patient: Alexandra Ward MRN: [id number] DOB: [birth year]  Admit date:     09/15/2021  Discharge date: 09/17/21  Discharge Physician: Barton Dubois   PCP: Monico Blitz, MD   Recommendations at discharge:  Repeat basic metabolic panel to follow electrolytes and renal function Reassess blood pressure and further adjust antihypertensive treatment as needed Make sure patient has follow-up with cardiology service as instructed Continue assisting patient with smoking cessation Consider Outpatient limited contrast CT chest for lung cancer screening referral.   Discharge Diagnoses: Principal Problem:   Pulmonary edema Active Problems:   Acute ST elevation myocardial infarction (STEMI) ([place])   Acute respiratory failure with hypoxia ([place])   Secondary spontaneous pneumothorax   HFrEF (heart failure with reduced ejection fraction) [facility])   Hospital Course: As per H&P written by Dr.Patwardhan on 09/15/2021 61 y/o Caucasian female  with hyperlipidemia, type 2 DM, h/o stroke (vertebral artery dissection 2018), STEMI (09/21/2020) treated with PPCI to prox LAD, HFrEF EF 30-35%, PAF, presented with acute shortness of breath   Patient had been doing fairly well without complaints till 6/13 late night when she had acute onset of shortness of breath. She denies chest pain, but also did not have clear chest pain with her MI in 09/2020. She had been compliant with her medications, but had pork chops outside 6 PM on 6/13.   She is still taking Aspirin, but not P2Y12i. She is on eliquis for Afib.   EMS and ER EKG showed old anterolateral infarct with ST elevation in V1-V3, that is different from her EKG on 09/01/2021. EMS SBP was 86 mmHg. BP now is 150s/80s. CXR shows flash pulmonary edema.   Assessment and Plan: * Pulmonary edema - In the setting most likely of acute on chronic systolic heart failure. -Patient in the requiring transient ventilatory support and her care was  complicated with a spontaneous pneumothorax. -After IV Lasix and further stabilization she was successfully extubated and chest tube removed. -Good saturation appreciated at time of discharge without oxygen supplementation. -GDMT medication as per cardiology recommendation has been resumed and she is ready to go home. -Low-sodium diet, daily weights and adequate hydration discussed with patient.  Essential hypertension - Current dressing use of pressure supports in the setting of hypertension -At discharge we will continue low-dose of midodrine with close follow-up of patient's vital signs. -Okay to continue current use of adjusted dose of losartan and carvedilol.  Paroxysmal atrial fibrillation (HCC) - Sinus rhythm currently -Rate controlled -Continue amiodarone and Eliquis.  HFrEF (heart failure with reduced ejection fraction) (HCC) - Decompensated at time of admission with flash pulmonary edema. -Discharge stabilize volume; no requiring oxygen supplementation and hemodynamically stable. -Resume GDMT as per cardiology recommendation.  Secondary spontaneous pneumothorax - Status post chest tube -Resolved and not requiring oxygen supplementation at discharge.  Acute respiratory failure with hypoxia (HCC) - In the setting of acute on chronic systolic heart failure with flash pulmonary edema and a spontaneous pneumothorax. -Respiratory status back to baseline and hypoxia resolved -Patient not requiring oxygen supplementation at discharge.  Acute ST elevation myocardial infarction (STEMI) (HCC) - Elevation of patient's troponin felt to be secondary to pulmonary edema, CHF exacerbation and spontaneous pneumothorax. -Cardiac cath demonstrating patent arteries and no acute abnormalities. -Continue risk factor modifications and outpatient follow-up with cardiology service.  Hyperlipidemia - Continue statins.  Tobacco abuse - Cessation counseling provided -Nicotine patch prescribed at  discharge.  Consultants: PCCM and cardiology service. Procedures performed: See below for x-ray  report; intubation and mechanical ventilation; chest tube for pneumothorax. Disposition: Home Diet recommendation: Heart healthy/low-sodium diet.  DISCHARGE MEDICATION: Allergies as of 09/17/2021       Reactions   Hydrocodone Nausea And Vomiting   Other Nausea And Vomiting, Other (See Comments)   States all pain medications make her very ill- vomits   Tyloxapol Nausea And Vomiting   Oxycodone-acetaminophen Nausea And Vomiting   Prednisone Rash, Other (See Comments)   Chest pain and a short-lived rash on the neck        Medication List     STOP taking these medications    clopidogrel 75 MG tablet Commonly known as: PLAVIX       TAKE these medications    amiodarone 100 MG tablet Commonly known as: PACERONE Take 1 tablet (100 mg total) by mouth daily. What changed: Another medication with the same name was removed. Continue taking this medication, and follow the directions you see here.   apixaban 5 MG Tabs tablet Commonly known as: ELIQUIS Take 1 tablet (5 mg total) by mouth 2 (two) times daily.   aspirin 81 MG chewable tablet Chew 1 tablet (81 mg total) by mouth daily. What changed: when to take this   atorvastatin 80 MG tablet Commonly known as: LIPITOR TAKE 1 TABLET BY MOUTH DAILY AT 8pm What changed: See the new instructions.   carvedilol 6.25 MG tablet Commonly known as: COREG Take 0.5 tablets (3.125 mg total) by mouth 2 (two) times daily. What changed: how much to take   dapagliflozin propanediol 10 MG Tabs tablet Commonly known as: FARXIGA Take 1 tablet (10 mg total) by mouth daily. Start taking on: September 18, 2021   furosemide 20 MG tablet Commonly known as: LASIX TAKE 1 TABLET BY MOUTH AS NEEDED FOR FLUID OR EDEMA What changed: See the new instructions.   losartan 25 MG tablet Commonly known as: COZAAR Take 0.5 tablets (12.5 mg total) by mouth  daily. What changed: when to take this   Lumify 0.025 % Soln Generic drug: Brimonidine Tartrate Place 1 drop into both eyes daily as needed (irritation/dry eyes).   metFORMIN 500 MG tablet Commonly known as: GLUCOPHAGE Take 500 mg by mouth 2 (two) times daily.   midodrine 5 MG tablet Commonly known as: PROAMATINE Take 1 tablet (5 mg total) by mouth 3 (three) times daily with meals.   nicotine 21 mg/24hr patch Commonly known as: NICODERM CQ - dosed in mg/24 hours Place 1 patch (21 mg total) onto the skin daily. Start taking on: September 18, 2021   pantoprazole 40 MG tablet Commonly known as: PROTONIX Take 1 tablet (40 mg total) by mouth daily. Start taking on: September 18, 2021        Follow-up Information     Rayetta Pigg, [place] [place], [place] Follow up on 09/23/2021.   Specialty: Cardiology Why: 10:30 AM Contact information: [place] [address] [phone]         Monico Blitz, MD. Schedule an appointment as soon as possible for a visit in 2 week(s).   Specialty: Internal Medicine Contact information: [place] [address] [phone]                Discharge Exam: Danley Danker Weights   09/17/21 0411  Weight: 77 kg   General exam: Alert, awake, oriented x 3; no chest pain, no nausea, no vomiting.  Good saturation on room air.  No requiring oxygen supplementation with activity.  Feeling ready to  go home. Respiratory system: Positive scattered rhonchi, no wheezing, no crackles.  No using accessory muscles. Cardiovascular system:RRR.  No rubs or gallops; no JVD on exam. Gastrointestinal system: Abdomen is obese, nondistended, soft and nontender. No organomegaly or masses felt. Normal bowel sounds heard. Central nervous system: Alert and oriented. No focal neurological deficits. Extremities: No cyanosis or clubbing. Skin: No petechiae. Psychiatry: Judgement and insight appear normal. Mood & affect appropriate.    Condition at  discharge: Stable and improved.  The results of significant diagnostics from this hospitalization (including imaging, microbiology, ancillary and laboratory) are listed below for reference.   Imaging Studies: ECHOCARDIOGRAM COMPLETE  Result Date: 09/16/2021    ECHOCARDIOGRAM REPORT   Patient Name:   LAVONE BARRIENTES Date of Exam: 09/16/2021 Medical Rec #:  [id number]       Height:       64.0 in Accession #:    [id number]      Weight:       177.0 lb Date of Birth:  1960-12-04        BSA:          1.857 m Patient Age:    60 years        BP:           122/51 mmHg Patient Gender: F               HR:           66 bpm. Exam Location:  Inpatient Procedure: 2D Echo, Cardiac Doppler, Color Doppler and Intracardiac            Opacification Agent Indications:    I50.40* Unspecified combined systolic (congestive) and diastolic                 (congestive) heart failure  History:        Patient has prior history of Echocardiogram examinations, most                 recent 09/21/2020. CHF, Previous Myocardial Infarction, Abnormal                 ECG, Stroke, Arrythmias:Atrial Fibrillation,                 Signs/Symptoms:Edema; Risk Factors:Diabetes and Current Smoker.                 Pneumothorax.  Sonographer:    Sheralyn Boatman RDCS Referring Phys: [id number] [facility] J PATWARDHAN  Sonographer Comments: Technically difficult study due to poor echo windows. Image acquisition challenging due to patient body habitus. Patient could not turn. IMPRESSIONS  1. Left ventricular ejection fraction, by estimation, is 25 to 30%. The left ventricle has severely decreased function. The left ventricle demonstrates regional wall motion abnormalities (see scoring diagram/findings for description). The left ventricular internal cavity size was mildly dilated. Left ventricular diastolic parameters are indeterminate. There is severe hypokinesis of the left ventricular, entire anteroseptal wall and anterior wall. There is akinesis of the left ventricular,  apical segment.  2. Right ventricular systolic function is normal. The right ventricular size is normal.  3. The mitral valve is normal in structure. Mild mitral valve regurgitation. No evidence of mitral stenosis.  4. The aortic valve is normal in structure. Aortic valve regurgitation is not visualized. No aortic stenosis is present. Comparison(s): Previous outpatient study on 03/10/2021 reported PASP 45 mmHg, grade III DD, mild LA dilatation-not appreciated on this study. EF reported 30-35% then. FINDINGS  Left Ventricle: Left ventricular ejection fraction,  by estimation, is 25 to 30%. The left ventricle has severely decreased function. The left ventricle demonstrates regional wall motion abnormalities. Severe hypokinesis of the left ventricular, entire anteroseptal wall and anterior wall. Definity contrast agent was given IV to delineate the left ventricular endocardial borders. The left ventricular internal cavity size was mildly dilated. There is no left ventricular hypertrophy. Left ventricular diastolic parameters are indeterminate. Right Ventricle: The right ventricular size is normal. No increase in right ventricular wall thickness. Right ventricular systolic function is normal. Left Atrium: Left atrial size was normal in size. Right Atrium: Right atrial size was normal in size. Pericardium: There is no evidence of pericardial effusion. Mitral Valve: The mitral valve is normal in structure. Mild mitral valve regurgitation. No evidence of mitral valve stenosis. Tricuspid Valve: The tricuspid valve is normal in structure. Tricuspid valve regurgitation is not demonstrated. No evidence of tricuspid stenosis. Aortic Valve: The aortic valve is normal in structure. Aortic valve regurgitation is not visualized. No aortic stenosis is present. Pulmonic Valve: The pulmonic valve was normal in structure. Pulmonic valve regurgitation is not visualized. No evidence of pulmonic stenosis. Aorta: The aortic root is normal in  size and structure. IAS/Shunts: The interatrial septum was not assessed.  LEFT VENTRICLE PLAX 2D LVIDd:         6.10 cm      Diastology LVIDs:         4.50 cm      LV e' medial:    4.28 cm/s LV PW:         1.00 cm      LV E/e' medial:  19.2 LV IVS:        1.40 cm      LV e' lateral:   3.90 cm/s LVOT diam:     2.00 cm      LV E/e' lateral: 21.1 LV SV:         70 LV SV Index:   38 LVOT Area:     3.14 cm  LV Volumes (MOD) LV vol d, MOD A2C: 126.0 ml LV vol d, MOD A4C: 187.0 ml LV vol s, MOD A2C: 94.5 ml LV vol s, MOD A4C: 103.0 ml LV SV MOD A2C:     31.5 ml LV SV MOD A4C:     187.0 ml LV SV MOD BP:      57.0 ml RIGHT VENTRICLE             IVC RV S prime:     11.10 cm/s  IVC diam: 1.40 cm TAPSE (M-mode): 1.8 cm LEFT ATRIUM             Index        RIGHT ATRIUM           Index LA diam:        4.30 cm 2.32 cm/m   RA Area:     12.60 cm LA Vol (A2C):   36.6 ml 19.71 ml/m  RA Volume:   29.30 ml  15.77 ml/m LA Vol (A4C):   38.8 ml 20.89 ml/m LA Biplane Vol: 37.2 ml 20.03 ml/m  AORTIC VALVE LVOT Vmax:   108.00 cm/s LVOT Vmean:  69.000 cm/s LVOT VTI:    0.222 m  AORTA Ao Root diam: 2.80 cm Ao Asc diam:  3.20 cm MITRAL VALVE MV Area (PHT): 3.08 cm    SHUNTS MV Decel Time: 246 msec    Systemic VTI:  0.22 m MV E velocity: 82.30 cm/s  Systemic Diam: 2.00  cm MV A velocity: 82.30 cm/s MV E/A ratio:  1.00 Vernell Leep MD Electronically signed by Vernell Leep MD Signature Date/Time: 09/16/2021/9:50:38 PM    Final    DG Chest Port 1 View  Result Date: 09/16/2021 CLINICAL DATA:  Shortness of breath. EXAM: PORTABLE CHEST 1 VIEW COMPARISON:  Chest radiograph 09/16/2021 at 6:02 a.m. FINDINGS: The cardiomediastinal silhouette is unchanged. A chest tube remains in place at the right lung base. The small right apical pneumothorax on the prior study has either decreased or resolved, with only a questionable tiny pneumothorax being currently visible. Mild bibasilar opacities are similar to the prior study and compatible with  atelectasis. No sizable pleural effusion is evident. IMPRESSION: 1. Decreased or resolved tiny right apical pneumothorax. 2. Unchanged bibasilar atelectasis. Electronically Signed   By: Logan Bores M.D.   On: 09/16/2021 13:38   DG Chest Port 1 View  Result Date: 09/16/2021 CLINICAL DATA:  Follow-up chest tube EXAM: PORTABLE CHEST 1 VIEW COMPARISON:  09/15/2021 FINDINGS: Heart and mediastinal shadows are normal. Chest tube remains in place within the inferior pleural space on the right. Tiny amount of pleural air at the apex is unchanged. Mild atelectasis at the lung bases, slightly improved. IMPRESSION: Right chest tube remains in place. Tiny amount of apical pleural air is unchanged. Slight improvement in basilar aeration. Electronically Signed   By: Nelson Chimes M.D.   On: 09/16/2021 08:05   DG Chest Port 1 View  Result Date: 09/15/2021 CLINICAL DATA:  Pneumothorax. EXAM: PORTABLE CHEST 1 VIEW COMPARISON:  Same day. FINDINGS: Interval placement of right-sided chest tube. Minimal right apical pneumothorax is noted which is significantly decreased compared to prior exam. Left basilar atelectasis or infiltrate is noted. Bony thorax is unremarkable. IMPRESSION: Minimal right apical pneumothorax is noted which is significantly smaller compared to prior exam, status post right-sided chest tube placement. Electronically Signed   By: Marijo Conception M.D.   On: 09/15/2021 16:33   DG CHEST PORT 1 VIEW  Result Date: 09/15/2021 CLINICAL DATA:  Accidental awareness under general anesthesia during emergence or extubation. EXAM: PORTABLE CHEST 1 VIEW COMPARISON:  09/15/2021. FINDINGS: Endotracheal tube terminates 4.0 cm above the carina. Nasogastric tube is followed into the stomach with the tip projecting beyond the inferior margin of the image. Moderate to large right pneumothorax, new from 0033 hours. Interstitial prominence and indistinctness bilaterally, left greater than right. Probable tiny left pleural  effusion. IMPRESSION: 1. Moderate to large right pneumothorax, new from 09/15/2021. Critical Value/emergent results were called by telephone at the time of interpretation on 09/15/2021 at 8:14 am to provider [facility] , who verbally acknowledged these results. 2. Pulmonary edema with a probable tiny left pleural effusion. Electronically Signed   By: Lorin Picket M.D.   On: 09/15/2021 08:14   CARDIAC CATHETERIZATION  Result Date: 09/15/2021 Images from the original result were not included.   The radiation dose exceeded thresholds defined in the "Patient Radiation Dose Management For Interventional Medical Procedures With Extensive Use of Fluoroscopy" policy. Specific follow up instructions will be provided to the patient prior to discharge. LM: Normal LAD: Patent mid LAD stent RCA: Mid 100% occlusion          Left-to-right collaterals LVEDP 37 mmHg No acute occlusion Presentation likely due to flash pulmonary edema Nigel Mormon, MD Pager: [phone] Office: [phone]  DG Chest Port 1 View  Result Date: 09/15/2021 CLINICAL DATA:  Respiratory failure EXAM: PORTABLE CHEST 1 VIEW COMPARISON:  09/15/2021 FINDINGS: Endotracheal tube  5 cm above the carina. Nasogastric tube extends into the upper abdomen beyond the margin of the examination asymmetric interstitial and airspace infiltrate is seen throughout the left lung, more prevalent at the left lung base, infection versus asymmetric pulmonary edema. Moderate right pleural effusion is unchanged. No pneumothorax. Cardiac size within normal limits. No acute bone abnormality. IMPRESSION: 1. Support tubes in appropriate position. 2. Stable asymmetric interstitial and airspace infiltrate throughout the left lung, more prevalent at the left lung base, infection versus asymmetric pulmonary edema. 3. Moderate right pleural effusion, unchanged. Electronically Signed   By: Fidela Salisbury M.D.   On: 09/15/2021 00:55   DG Chest Port 1 View  Result Date:  09/15/2021 CLINICAL DATA:  Dyspnea EXAM: PORTABLE CHEST 1 VIEW COMPARISON:  03/21/2021 FINDINGS: There is progressive interstitial and airspace infiltrate throughout the left lung, more prevalent within the left lung base, in keeping with acute infection or asymmetric pulmonary edema. Moderate right pleural effusion is again seen and appears stable with associated right basilar compressive atelectasis. No pneumothorax. Cardiac size within normal limits. No acute bone abnormality. IMPRESSION: Progressive interstitial and airspace infiltrate throughout the left lung, more prevalent within the left lung base, in keeping with acute infection or asymmetric pulmonary edema. Electronically Signed   By: Fidela Salisbury M.D.   On: 09/15/2021 00:20    Microbiology: Results for orders placed or performed during the hospital encounter of 09/15/21  Resp Panel by RT-PCR (Flu A&B, Covid) Anterior Nasal Swab     Status: None   Collection Time: 09/15/21 12:11 AM   Specimen: Anterior Nasal Swab  Result Value Ref Range Status   SARS Coronavirus 2 by RT PCR NEGATIVE NEGATIVE Final    Comment: (NOTE) SARS-CoV-2 target nucleic acids are NOT DETECTED.  The SARS-CoV-2 RNA is generally detectable in upper respiratory specimens during the acute phase of infection. The lowest concentration of SARS-CoV-2 viral copies this assay can detect is 138 copies/mL. A negative result does not preclude SARS-Cov-2 infection and should not be used as the sole basis for treatment or other patient management decisions. A negative result may occur with  improper specimen collection/handling, submission of specimen other than nasopharyngeal swab, presence of viral mutation(s) within the areas targeted by this assay, and inadequate number of viral copies(<138 copies/mL). A negative result must be combined with clinical observations, patient history, and epidemiological information. The expected result is Negative.  Fact Sheet for  Patients:  EntrepreneurPulse.com.au  Fact Sheet for Healthcare Providers:  IncredibleEmployment.be  This test is no t yet approved or cleared by the [place] FDA and  has been authorized for detection and/or diagnosis of SARS-CoV-2 by FDA under an Emergency Use Authorization (EUA). This EUA will remain  in effect (meaning this test can be used) for the duration of the COVID-19 declaration under Section 564(b)(1) of the Act, 21 U.S.C.section 360bbb-3(b)(1), unless the authorization is terminated  or revoked sooner.       Influenza A by PCR NEGATIVE NEGATIVE Final   Influenza B by PCR NEGATIVE NEGATIVE Final    Comment: (NOTE) The Xpert Xpress SARS-CoV-2/FLU/RSV plus assay is intended as an aid in the diagnosis of influenza from Nasopharyngeal swab specimens and should not be used as a sole basis for treatment. Nasal washings and aspirates are unacceptable for Xpert Xpress SARS-CoV-2/FLU/RSV testing.  Fact Sheet for Patients: EntrepreneurPulse.com.au  Fact Sheet for Healthcare Providers: IncredibleEmployment.be  This test is not yet approved or cleared by the [place] and has been authorized for  detection and/or diagnosis of SARS-CoV-2 by FDA under an Emergency Use Authorization (EUA). This EUA will remain in effect (meaning this test can be used) for the duration of the COVID-19 declaration under Section 564(b)(1) of the Act, 21 U.S.C. section 360bbb-3(b)(1), unless the authorization is terminated or revoked.  Performed at [facility], [address] [place], [place] [address]   MRSA Next Gen by PCR, Nasal     Status: None   Collection Time: 09/15/21  1:35 AM   Specimen: Nasal Mucosa; Nasal Swab  Result Value Ref Range Status   MRSA by PCR Next Gen NOT DETECTED NOT DETECTED Final    Comment: (NOTE) The GeneXpert MRSA Assay (FDA approved for NASAL specimens only), is one  component of a comprehensive MRSA colonization surveillance program. It is not intended to diagnose MRSA infection nor to guide or monitor treatment for MRSA infections. Test performance is not FDA approved in patients less than 66 years old. Performed at [facility], [address] [place], [place] [address]   Body fluid culture w Gram Stain     Status: None (Preliminary result)   Collection Time: 09/15/21  9:05 AM   Specimen: Pleural Fluid  Result Value Ref Range Status   Specimen Description PLEURAL RIGHT  Final   Special Requests NONE  Final   Gram Stain NO WBC SEEN NO ORGANISMS SEEN   Final   Culture   Final    NO GROWTH < 24 HOURS Performed at [facility], [address], [place], [place] [address]    Report Status PENDING  Incomplete    Labs: CBC: Recent Labs  Lab 09/15/21 0035 09/15/21 0508 09/17/21 0315  WBC 21.9* 18.5* 13.0*  NEUTROABS 11.8*  --   --   HGB 12.8 12.5 11.9*  HCT 41.4 38.9 36.2  MCV 93.5 90.7 88.1  PLT 359 333 252   Basic Metabolic Panel: Recent Labs  Lab 09/15/21 0035 09/15/21 0508 09/16/21 0646 09/17/21 0315  NA 141 141 138 139  K 4.5 4.4 3.0* 4.1  CL 104 107 96* 102  CO2 23 26 30 27   GLUCOSE 322* 147* 195* 98  BUN 14 16 19 12   CREATININE 1.31* 1.22* 1.17* 0.76  CALCIUM 8.3* 8.3* 8.9 8.5*  MG  --  2.0 1.9  --    Liver Function Tests: Recent Labs  Lab 09/15/21 0035 09/15/21 0918  AST 20  --   ALT 15  --   ALKPHOS 68  --   BILITOT 0.7  --   PROT 6.8 7.0  ALBUMIN 3.3*  --    CBG: Recent Labs  Lab 09/16/21 1119 09/16/21 1403 09/16/21 1647 09/16/21 2113 09/17/21 0618  GLUCAP 163* 170* 142* 113* 120*    Discharge time spent: greater than 30 minutes.  Signed: 2114, MD [facility] 09/17/2021

## 2021-09-17 NOTE — Assessment & Plan Note (Signed)
-   In the setting most likely of acute on chronic systolic heart failure. -Patient in the requiring transient ventilatory support and her care was complicated with a spontaneous pneumothorax. -After IV Lasix and further stabilization she was successfully extubated and chest tube removed. -Good saturation appreciated at time of discharge without oxygen supplementation. -GDMT medication as per cardiology recommendation has been resumed and she is ready to go home. -Low-sodium diet, daily weights and adequate hydration discussed with patient.

## 2021-09-17 NOTE — Assessment & Plan Note (Signed)
-  Cessation counseling provided -Nicotine patch prescribed at discharge. 

## 2021-09-18 LAB — BODY FLUID CULTURE W GRAM STAIN
Culture: NO GROWTH
Gram Stain: NONE SEEN

## 2021-09-21 ENCOUNTER — Other Ambulatory Visit: Payer: Self-pay

## 2021-09-22 NOTE — Progress Notes (Addendum)
Primary Physician/Referring:  Kirstie Peri, MD  Patient ID: Alexandra Ward, female    DOB: 02-28-1961, 61 y.o.   MRN: [id number]  Chief Complaint  Patient presents with   Atrial Fibrillation    1-2 weeks   Follow-up    1-2 weeks   HFrEF   HPI:    Alexandra Ward  is a 62 y.o.  female  patient with hyperlipidemia, ongoing tobacco use, type 2 DM, h/o stroke (vertebral artery dissection 2018), STEMI (09/21/2020) treated with PCI to prox LAD, presented again 09/28/2020 and atrial fibrillation with RVR and LVEF 30-35%.  He was started on amiodarone and Eliquis and discharged.  Patient was admitted 09/15/2021 - 09/17/2021 with acute onset of shortness of breath EKG showed old anterolateral infarct with ST elevation in V1-V3 new compared to EKG on 09/01/2021 chest x-ray showed flash pulmonary edema.  Patient underwent coronary angiography 09/15/2021 which showed no acute occlusion, therefore suspected flash pulmonary edema and spontaneous right-sided pneumothorax to be likely etiology of patient's respiratory failure.  Hospitalization patient required ventilatory support, extubated following diuresis.  Patient was subsequently discharged on guideline directed medical therapy for heart failure including carvedilol, Farxiga, losartan, and furosemide 20 mg as needed.  Patient also discharged with midodrine 5 mg p.o. 3 times daily with meals.  In view of atrial fibrillation patient was discharged on amiodarone 100 mg p.o. daily and Eliquis as well as aspirin and atorvastatin given underlying CAD. Notably echocardiogram during recent admission revealed LVEF reduced further to 25-30%.  Patient now presents for hospital follow-up with her son and fianc present at bedside.  Patient's primary concern today is generalized fatigue and occasional brief episodes of positional dizziness.  She continues to tolerate anticoagulation without bleeding diathesis.  Reports dyspnea has significantly improved following discharge.   Notably weight has trended up about 5 to 6 pounds from discharge weight.  Past Medical History:  Diagnosis Date   Diabetes mellitus without complication (HCC)    Hyperlipidemia    Stroke [facility])    Vertebral artery dissection 2018   Past Surgical History:  Procedure Laterality Date   CORONARY THROMBECTOMY N/A 09/21/2020   Procedure: Coronary Thrombectomy;  Surgeon: Yates Decamp, MD;  Location: [facility] INVASIVE CV LAB;  Service: Cardiovascular;  Laterality: N/A;   CORONARY/GRAFT ACUTE MI REVASCULARIZATION N/A 09/21/2020   Procedure: Coronary/Graft Acute MI Revascularization;  Surgeon: Yates Decamp, MD;  Location: [facility] INVASIVE CV LAB;  Service: Cardiovascular;  Laterality: N/A;   CORONARY/GRAFT ACUTE MI REVASCULARIZATION N/A 09/15/2021   Procedure: Coronary/Graft Acute MI Revascularization;  Surgeon: Elder Negus, MD;  Location: [facility] INVASIVE CV LAB;  Service: Cardiovascular;  Laterality: N/A;   LEFT HEART CATH AND CORONARY ANGIOGRAPHY N/A 09/21/2020   Procedure: LEFT HEART CATH AND CORONARY ANGIOGRAPHY;  Surgeon: Yates Decamp, MD;  Location: [facility] INVASIVE CV LAB;  Service: Cardiovascular;  Laterality: N/A;   Family History  Problem Relation Age of Onset   Heart disease Mother     Social History   Tobacco Use   Smoking status: Every Day    Packs/day: 0.50    Years: 15.00    Total pack years: 7.50    Types: Cigarettes   Smokeless tobacco: Never  Substance Use Topics   Alcohol use: No   Marital Status: Divorced   ROS  Review of Systems  Constitutional: Positive for malaise/fatigue and weight gain.  Cardiovascular:  Positive for dyspnea on exertion (stable). Negative for chest pain, claudication, leg swelling, near-syncope, orthopnea, palpitations, paroxysmal nocturnal dyspnea and  syncope.  Neurological:  Negative for dizziness.    Objective  Blood pressure (!) 127/57, pulse (!) 57, temperature 97.6 F (36.4 C), temperature source Temporal, resp. rate 16, height 5\' 4"  (1.626 m), weight  175 lb 3.2 oz (79.5 kg), SpO2 97 %.     09/23/2021   10:45 AM 09/23/2021   10:34 AM 09/17/2021   11:26 AM  Vitals with BMI  Height  5\' 4"    Weight  175 lbs 3 oz   BMI  123456   Systolic [id number]  Diastolic 57 57 62  Pulse 57 50 63      Physical Exam Vitals reviewed.  Neck:     Vascular: No carotid bruit.  Cardiovascular:     Rate and Rhythm: Regular rhythm. Bradycardia present.     Pulses: Intact distal pulses.          Carotid pulses are 2+ on the right side and 2+ on the left side.      Radial pulses are 2+ on the right side and 2+ on the left side.       Popliteal pulses are 2+ on the right side and 2+ on the left side.       Dorsalis pedis pulses are 1+ on the right side and 0 on the left side.       Posterior tibial pulses are 1+ on the right side and 1+ on the left side.     Heart sounds: S1 normal and S2 normal. No murmur heard.    No gallop.  Pulmonary:     Effort: Pulmonary effort is normal. No respiratory distress.     Breath sounds: No wheezing, rhonchi or rales.  Musculoskeletal:     Right lower leg: Edema (trace ankle) present.     Left lower leg: Edema (trace ankle) present.  Neurological:     Mental Status: She is alert.     Laboratory examination:   Recent Labs    09/15/21 0508 09/16/21 0646 09/17/21 0315  NA 141 138 139  K 4.4 3.0* 4.1  CL 107 96* 102  CO2 26 30 27   GLUCOSE 147* 195* 98  BUN 16 19 12   CREATININE 1.22* 1.17* 0.76  CALCIUM 8.3* 8.9 8.5*  GFRNONAA 50* 53* >60   estimated creatinine clearance is 75.3 mL/min (by C-G formula based on SCr of 0.76 mg/dL).     Latest Ref Rng & Units 09/17/2021    3:15 AM 09/16/2021    6:46 AM 09/15/2021    9:18 AM  CMP  Glucose 70 - 99 mg/dL 98  195    BUN 8 - 23 mg/dL 12  19    Creatinine 0.44 - 1.00 mg/dL 0.76  1.17    Sodium 135 - 145 mmol/L 139  138    Potassium 3.5 - 5.1 mmol/L 4.1  3.0    Chloride 98 - 111 mmol/L 102  96    CO2 22 - 32 mmol/L 27  30    Calcium 8.9 - 10.3 mg/dL 8.5  8.9     Total Protein 6.5 - 8.1 g/dL   7.0       Latest Ref Rng & Units 09/17/2021    3:15 AM 09/15/2021    5:08 AM 09/15/2021   12:35 AM  CBC  WBC 4.0 - 10.5 K/uL 13.0  18.5  21.9   Hemoglobin 12.0 - 15.0 g/dL 11.9  12.5  12.8   Hematocrit 36.0 - 46.0 % 36.2  38.9  41.4  Platelets 150 - 400 K/uL 252  333  359     Lipid Panel Recent Labs    09/15/21 0035  CHOL 132  TRIG 111  LDLCALC 74  VLDL 22  HDL 36*  CHOLHDL 3.7    HEMOGLOBIN A1C Lab Results  Component Value Date   HGBA1C 6.0 (H) 09/15/2021   MPG 125.5 09/15/2021   TSH Recent Labs    10/21/20 1040  TSH 5.740*    External labs:  None   Allergies   Allergies  Allergen Reactions   Hydrocodone Nausea And Vomiting   Other Nausea And Vomiting and Other (See Comments)    States all pain medications make her very ill- vomits   Tyloxapol Nausea And Vomiting   Oxycodone-Acetaminophen Nausea And Vomiting   Prednisone Rash and Other (See Comments)    Chest pain and a short-lived rash on the neck    Medications Prior to Visit:   Outpatient Medications Prior to Visit  Medication Sig Dispense Refill   amiodarone (PACERONE) 100 MG tablet Take 1 tablet (100 mg total) by mouth daily. 30 tablet 2   apixaban (ELIQUIS) 5 MG TABS tablet Take 1 tablet (5 mg total) by mouth 2 (two) times daily. 60 tablet 3   aspirin 81 MG chewable tablet Chew 1 tablet (81 mg total) by mouth daily. 30 tablet 0   atorvastatin (LIPITOR) 80 MG tablet TAKE 1 TABLET BY MOUTH DAILY AT 8pm (Patient taking differently: Take 80 mg by mouth at bedtime.) 30 tablet 3   Brimonidine Tartrate (LUMIFY) 0.025 % SOLN Place 1 drop into both eyes daily as needed (irritation/dry eyes).     dapagliflozin propanediol (FARXIGA) 10 MG TABS tablet Take 1 tablet (10 mg total) by mouth daily. 30 tablet 2   losartan (COZAAR) 25 MG tablet Take 0.5 tablets (12.5 mg total) by mouth daily. (Patient taking differently: Take 12.5 mg by mouth at bedtime.) 45 tablet 3   metFORMIN  (GLUCOPHAGE) 500 MG tablet Take 500 mg by mouth 2 (two) times daily.     nicotine (NICODERM CQ - DOSED IN MG/24 HOURS) 21 mg/24hr patch Place 1 patch (21 mg total) onto the skin daily. 28 patch 3   pantoprazole (PROTONIX) 40 MG tablet Take 1 tablet (40 mg total) by mouth daily. 30 tablet 2   carvedilol (COREG) 6.25 MG tablet Take 0.5 tablets (3.125 mg total) by mouth 2 (two) times daily. 60 tablet 2   furosemide (LASIX) 20 MG tablet TAKE 1 TABLET BY MOUTH AS NEEDED FOR FLUID OR EDEMA (Patient taking differently: Take 20 mg by mouth daily as needed for fluid or edema (shortness of breath).) 30 tablet 3   midodrine (PROAMATINE) 5 MG tablet Take 1 tablet (5 mg total) by mouth 3 (three) times daily with meals. 90 tablet 0   No facility-administered medications prior to visit.   Final Medications at End of Visit    Current Meds  Medication Sig   amiodarone (PACERONE) 100 MG tablet Take 1 tablet (100 mg total) by mouth daily.   apixaban (ELIQUIS) 5 MG TABS tablet Take 1 tablet (5 mg total) by mouth 2 (two) times daily.   aspirin 81 MG chewable tablet Chew 1 tablet (81 mg total) by mouth daily.   atorvastatin (LIPITOR) 80 MG tablet TAKE 1 TABLET BY MOUTH DAILY AT 8pm (Patient taking differently: Take 80 mg by mouth at bedtime.)   Brimonidine Tartrate (LUMIFY) 0.025 % SOLN Place 1 drop into both eyes daily as needed (irritation/dry eyes).  dapagliflozin propanediol (FARXIGA) 10 MG TABS tablet Take 1 tablet (10 mg total) by mouth daily.   losartan (COZAAR) 25 MG tablet Take 0.5 tablets (12.5 mg total) by mouth daily. (Patient taking differently: Take 12.5 mg by mouth at bedtime.)   metFORMIN (GLUCOPHAGE) 500 MG tablet Take 500 mg by mouth 2 (two) times daily.   nicotine (NICODERM CQ - DOSED IN MG/24 HOURS) 21 mg/24hr patch Place 1 patch (21 mg total) onto the skin daily.   pantoprazole (PROTONIX) 40 MG tablet Take 1 tablet (40 mg total) by mouth daily.   [DISCONTINUED] carvedilol (COREG) 6.25 MG  tablet Take 0.5 tablets (3.125 mg total) by mouth 2 (two) times daily.   [DISCONTINUED] furosemide (LASIX) 20 MG tablet TAKE 1 TABLET BY MOUTH AS NEEDED FOR FLUID OR EDEMA (Patient taking differently: Take 20 mg by mouth daily as needed for fluid or edema (shortness of breath).)   [DISCONTINUED] midodrine (PROAMATINE) 5 MG tablet Take 1 tablet (5 mg total) by mouth 3 (three) times daily with meals.   Radiology:   No results found.  Cardiac Studies:   Coronary arteriogram 09/21/2020: LV hemodynamics: 121/18, EDP 21 mmHg.  Ao 126/74, mean 97 mmHg.  No pressure gradient across the aortic valve. LV: EF 30 to 35% with mid to distal anterior, anteroapical, apical and inferior apical akinesis to dyskinesis. LM: Large-caliber vessel.  Smooth and normal. LAD: Large vessel, just immediately after the first septal perforator, the LAD is flush occluded.  Small diagonal arises at this segment.  The LAD otherwise is a large wraparound LAD and supplies large part of the apical and inferoapical region.  There are collaterals to the right coronary artery. CX: Moderate to large vessel, appears smooth and normal. RCA: Severely diseased diffusely, mid segment appears subtotally to totally occluded with TIMI I flow.  Distal bed of the RCA could not be seen due to minimal flow.  There are ipsilateral and contralateral collaterals to the distal right.   Intervention: Successful stenting of the proximal and mid segment of the LAD with a long 3.0 x 24 mm Synergy XD DES following aspiration thrombectomy, stent deployed at a peak pressure of 18 atmospheric pressure, stenosis reduced from 100% to 0% with stepup and stepdown and TIMI 0 to TIMI-3 flow at the end of the procedure. I did attempt to cross the RCA however extremely difficult and appeared to behave like a CTO, lesion left alone.  Patient was completely asymptomatic throughout the procedure. 135 mill contrast utilized.  Left heart catheterization 09/15/2021: LM:  Normal LAD: Patent mid LAD stent RCA: Mid 100% occlusion          Left-to-right collaterals LVEDP 37 mmHg No acute occlusion Presentation likely due to flash pulmonary edema  Echocardiogram 09/16/2021:  1. Left ventricular ejection fraction, by estimation, is 25 to 30%. The left ventricle has severely decreased function. The left ventricle demonstrates regional wall motion abnormalities (see scoring diagram/findings for description). The left ventricular internal cavity size was mildly dilated. Left ventricular diastolic parameters are indeterminate. There is severe hypokinesis of the left ventricular, entire anteroseptal wall and anterior wall. There is akinesis of the left ventricular, apical segment.   2. Right ventricular systolic function is normal. The right ventricular size is normal.   3. The mitral valve is normal in structure. Mild mitral valve regurgitation. No evidence of mitral stenosis.   4. The aortic valve is normal in structure. Aortic valve regurgitation is not visualized. No aortic stenosis is present.   Comparison(s):  Previous outpatient study on 03/10/2021 reported PASP 45 mmHg, grade III DD, mild LA dilatation-not appreciated on this study. EF reported 30-35% then.  EKG:  09/23/2021: Sinus bradycardia at a rate of 48 bpm.  Normal axis.  Cannot exclude anteroseptal infarct old.  Low voltage complexes.  Nonspecific ST-T wave abnormality.  EKG 09/15/2021: Sinus rhythm Anterolateral infarct, age indeterminate STE different from EKG on 09/01/2021  09/01/2021: Sinus bradycardia at a rate of 52 bpm.  Normal axis. Low voltage complexes, consider pulmonary disease pattern. Anterolateral infarct, age undetermined.  EKG unchanged compared to 11/04/2020.  10/02/2020: Sinus rhythm at a rate of 65 bpm.  Right axis Anterolateral infarct, age indeterminate.  Low voltage complexes, consider pulmonary disease pattern.  09/28/2020: Afib w/RVR 199 bpm Anterolateral infarct, age  indeterminate  09/21/2020: Normal sinus rhythm, normal axis, diffuse anterolateral and inferior STEMI.  Low-voltage complexes.  Assessment     ICD-10-CM   1. Coronary artery disease involving native coronary artery of native heart without angina pectoris  I25.10 EKG 12-Lead    2. Chronic combined systolic and diastolic heart failure (HCC)  123456 Basic metabolic panel    Brain natriuretic peptide    3. Paroxysmal atrial fibrillation (HCC)  I48.0        Medications Discontinued During This Encounter  Medication Reason   carvedilol (COREG) 6.25 MG tablet Discontinued by provider   midodrine (PROAMATINE) 5 MG tablet Discontinued by provider   furosemide (LASIX) 20 MG tablet      Meds ordered this encounter  Medications   furosemide (LASIX) 20 MG tablet    Sig: Take 1 tablet (20 mg total) by mouth daily.    Dispense:  30 tablet    Refill:  3    This prescription was filled on 07/02/2021. Any refills authorized will be placed on file.    This patients CHA2DS2-VASc Score 4 (MI, F, stroke)and yearly risk of stroke 4.8%.   Recommendations:   Alexandra Ward is a 61 y.o. female  patient with hyperlipidemia, ongoing tobacco use, type 2 DM, h/o stroke (vertebral artery dissection 2018), STEMI (09/21/2020) treated with PCI to prox LAD, presented again 09/28/2020 and atrial fibrillation with RVR and LVEF 30-35%.  He was started on amiodarone and Eliquis and discharged.  Patient was admitted 09/15/2021 - 09/17/2021 with acute onset of shortness of breath EKG showed old anterolateral infarct with ST elevation in V1-V3 new compared to EKG on 09/01/2021 chest x-ray showed flash pulmonary edema.  Patient underwent coronary angiography 09/15/2021 which showed no acute occlusion, therefore suspected flash pulmonary edema and spontaneous right-sided pneumothorax to be likely etiology of patient's respiratory failure.  Hospitalization patient required ventilatory support, extubated following diuresis.   Patient was subsequently discharged on guideline directed medical therapy for heart failure including carvedilol, Farxiga, losartan, and furosemide 20 mg as needed.  Patient also discharged with midodrine 5 mg p.o. 3 times daily with meals.  In view of atrial fibrillation patient was discharged on amiodarone 100 mg p.o. daily and Eliquis as well as aspirin and atorvastatin given underlying CAD.  Notably echocardiogram during recent admission revealed LVEF reduced further to 25-30%.  Patient now presents for hospital follow-up.  Blood pressure has improved since discharge, however EKG today reveals patient is markedly bradycardic.  We will discontinue carvedilol as well as midodrine.  Patient's weight has trended up since discharge, therefore advised patient to take Lasix 20 mg p.o. once daily.  We will repeat BMP and BMP in 1 week.  Reiterated the importance of  tobacco cessation, DASH diet, diet and lifestyle modifications, as well as monitoring daily weights.  Follow-up in 2 weeks, sooner if needed.   Alexandra Berthold, PA-C 09/23/2021, 3:01 PM Office: [phone]  Addendum 10/01/2021: External labs 09/30/2021: BUN 18, creatinine 1.06, GFR 60, sodium 140, potassium 4.2 proBNP 901   Omya Winfield Royston Sinner, PA-C 10/01/2021, 10:11 AM Office: [phone]

## 2021-09-23 ENCOUNTER — Ambulatory Visit: Payer: Self-pay | Admitting: Student

## 2021-09-23 ENCOUNTER — Encounter: Payer: Self-pay | Admitting: Student

## 2021-09-23 VITALS — BP 127/57 | HR 57 | Temp 97.6°F | Resp 16 | Ht 64.0 in | Wt 175.2 lb

## 2021-09-23 DIAGNOSIS — I480 Paroxysmal atrial fibrillation: Secondary | ICD-10-CM

## 2021-09-23 DIAGNOSIS — I2510 Atherosclerotic heart disease of native coronary artery without angina pectoris: Secondary | ICD-10-CM

## 2021-09-23 DIAGNOSIS — I5042 Chronic combined systolic (congestive) and diastolic (congestive) heart failure: Secondary | ICD-10-CM

## 2021-09-23 MED ORDER — FUROSEMIDE 20 MG PO TABS: 20.0000 mg | ORAL_TABLET | Freq: Every day | ORAL | 3 refills | Status: DC

## 2021-09-23 NOTE — Patient Instructions (Signed)
Labs in 1 week at [facility].  

## 2021-09-30 ENCOUNTER — Telehealth: Payer: Self-pay

## 2021-09-30 DIAGNOSIS — I5042 Chronic combined systolic (congestive) and diastolic (congestive) heart failure: Secondary | ICD-10-CM

## 2021-09-30 NOTE — Telephone Encounter (Signed)
Patient son called and stated that he and the patient have been monitoring patient's BP.  Readings are as follows:  8:20am  128/77 HR 74 6:30am  161/83 HR 74  2:30am  153/86 HR 78 2:15am  131/83 HR  Patient stated that she will usually "break out in a sweat, and that what wakes her up", so she will check her BP then.   11/29/2021  6:30pm  120/81 HR 68 1pm :     121/71 HR 61   Patient stated that it seems to spike up after .  Patient takes the Losartan every morning around 8am.

## 2021-10-01 MED ORDER — SPIRONOLACTONE 25 MG PO TABS: 12.5000 mg | ORAL_TABLET | Freq: Every day | ORAL | 3 refills | Status: DC

## 2021-10-01 NOTE — Telephone Encounter (Signed)
Called and spoke with patient regarding her results. Patient will go back in 1 week after starting Spironolactone to have blood work drawn. Have the orders been placed? Please advise.

## 2021-10-01 NOTE — Telephone Encounter (Signed)
They have. Thank you.

## 2021-10-01 NOTE — Telephone Encounter (Signed)
Yes, please just make sure thay have been release

## 2021-10-06 NOTE — Progress Notes (Unsigned)
Primary Physician/Referring:  Monico Blitz, MD  Patient ID: Alexandra Ward, female    DOB: 12-Sep-1960, 61 y.o.   MRN: [id number]  No chief complaint on file.  HPI:    Alexandra Ward  is a 61 y.o.  female  patient with hyperlipidemia, ongoing tobacco use, type 2 DM, h/o stroke (vertebral artery dissection 2018), STEMI (09/21/2020) treated with PCI to prox LAD, presented again 09/28/2020 and atrial fibrillation with RVR and LVEF 30-35%.  He was started on amiodarone and Eliquis and discharged.  Patient was admitted 09/15/2021 - 09/17/2021 with acute onset of shortness of breath EKG showed old anterolateral infarct with ST elevation in V1-V3 new compared to EKG on 09/01/2021 chest x-ray showed flash pulmonary edema.  Patient underwent coronary angiography 09/15/2021 which showed no acute occlusion, therefore suspected flash pulmonary edema and spontaneous right-sided pneumothorax to be likely etiology of patient's respiratory failure.  Hospitalization patient required ventilatory support, extubated following diuresis.  Patient was subsequently discharged on guideline directed medical therapy for heart failure including carvedilol, Farxiga, losartan, and furosemide 20 mg as needed.  Patient also discharged with midodrine 5 mg p.o. 3 times daily with meals.  In view of atrial fibrillation patient was discharged on amiodarone 100 mg p.o. daily and Eliquis as well as aspirin and atorvastatin given underlying CAD. Notably echocardiogram during recent admission revealed LVEF reduced further to 25-30%.  Patient now presents for hospital follow-up with her son and fianc present at bedside.  Patient's primary concern today is generalized fatigue and occasional brief episodes of positional dizziness.  She continues to tolerate anticoagulation without bleeding diathesis.  Reports dyspnea has significantly improved following discharge.  Notably weight has trended up about 5 to 6 pounds from discharge weight.  Past  Medical History:  Diagnosis Date   Diabetes mellitus without complication ([place])    Hyperlipidemia    Stroke [facility])    Vertebral artery dissection 2018   Past Surgical History:  Procedure Laterality Date   CORONARY THROMBECTOMY N/A 09/21/2020   Procedure: Coronary Thrombectomy;  Surgeon: Adrian Prows, MD;  Location: [place] [facility];  Service: Cardiovascular;  Laterality: N/A;   CORONARY/GRAFT ACUTE MI REVASCULARIZATION N/A 09/21/2020   Procedure: Coronary/Graft Acute MI Revascularization;  Surgeon: Adrian Prows, MD;  Location: [place] [facility];  Service: Cardiovascular;  Laterality: N/A;   CORONARY/GRAFT ACUTE MI REVASCULARIZATION N/A 09/15/2021   Procedure: Coronary/Graft Acute MI Revascularization;  Surgeon: Nigel Mormon, MD;  Location: [place] [facility];  Service: Cardiovascular;  Laterality: N/A;   LEFT HEART CATH AND CORONARY ANGIOGRAPHY N/A 09/21/2020   Procedure: LEFT HEART CATH AND CORONARY ANGIOGRAPHY;  Surgeon: Adrian Prows, MD;  Location: [place] [facility];  Service: Cardiovascular;  Laterality: N/A;   Family History  Problem Relation Age of Onset   Heart disease Mother     Social History   Tobacco Use   Smoking status: Every Day    Packs/day: 0.50    Years: 15.00    Total pack years: 7.50    Types: Cigarettes   Smokeless tobacco: Never  Substance Use Topics   Alcohol use: No   Marital Status: Divorced   ROS  Review of Systems  Constitutional: Positive for malaise/fatigue and weight gain.  Cardiovascular:  Positive for dyspnea on exertion (stable). Negative for chest pain, claudication, leg swelling, near-syncope, orthopnea, palpitations, paroxysmal nocturnal dyspnea and syncope.  Neurological:  Negative for dizziness.    Objective  There were no vitals taken for this visit.  09/23/2021   10:45 AM 09/23/2021   10:34 AM 09/17/2021   11:26 AM  Vitals with BMI  Height  5\' 4"    Weight  175 lbs 3 oz   BMI  30.06   Systolic 127 99 120  Diastolic 57 57 62   Pulse 57 50 63      Physical Exam Vitals reviewed.  Neck:     Vascular: No carotid bruit.  Cardiovascular:     Rate and Rhythm: Regular rhythm. Bradycardia present.     Pulses: Intact distal pulses.          Carotid pulses are 2+ on the right side and 2+ on the left side.      Radial pulses are 2+ on the right side and 2+ on the left side.       Popliteal pulses are 2+ on the right side and 2+ on the left side.       Dorsalis pedis pulses are 1+ on the right side and 0 on the left side.       Posterior tibial pulses are 1+ on the right side and 1+ on the left side.     Heart sounds: S1 normal and S2 normal. No murmur heard.    No gallop.  Pulmonary:     Effort: Pulmonary effort is normal. No respiratory distress.     Breath sounds: No wheezing, rhonchi or rales.  Musculoskeletal:     Right lower leg: Edema (trace ankle) present.     Left lower leg: Edema (trace ankle) present.  Neurological:     Mental Status: She is alert.     Laboratory examination:   Recent Labs    09/15/21 0508 09/16/21 0646 09/17/21 0315  NA 141 138 139  K 4.4 3.0* 4.1  CL 107 96* 102  CO2 26 30 27   GLUCOSE 147* 195* 98  BUN 16 19 12   CREATININE 1.22* 1.17* 0.76  CALCIUM 8.3* 8.9 8.5*  GFRNONAA 50* 53* >60    estimated creatinine clearance is 75.3 mL/min (by C-G formula based on SCr of 0.76 mg/dL).     Latest Ref Rng & Units 09/17/2021    3:15 AM 09/16/2021    6:46 AM 09/15/2021    9:18 AM  CMP  Glucose 70 - 99 mg/dL 98  09/19/2021    BUN 8 - 23 mg/dL 12  19    Creatinine 09/18/2021 - 1.00 mg/dL 09/17/2021  035    Sodium 4.65 - 145 mmol/L 139  138    Potassium 3.5 - 5.1 mmol/L 4.1  3.0    Chloride 98 - 111 mmol/L 102  96    CO2 22 - 32 mmol/L 27  30    Calcium 8.9 - 10.3 mg/dL 8.5  8.9    Total Protein 6.5 - 8.1 g/dL   7.0       Latest Ref Rng & Units 09/17/2021    3:15 AM 09/15/2021    5:08 AM 09/15/2021   12:35 AM  CBC  WBC 4.0 - 10.5 K/uL 13.0  18.5  21.9   Hemoglobin 12.0 - 15.0 g/dL 09/19/2021  09/17/2021   09/17/2021   Hematocrit 36.0 - 46.0 % 36.2  38.9  41.4   Platelets 150 - 400 K/uL 252  333  359     Lipid Panel Recent Labs    09/15/21 0035  CHOL 132  TRIG 111  LDLCALC 74  VLDL 22  HDL 36*  CHOLHDL 3.7     HEMOGLOBIN  A1C Lab Results  Component Value Date   HGBA1C 6.0 (H) 09/15/2021   MPG 125.5 09/15/2021   TSH Recent Labs    10/21/20 1040  TSH 5.740*     External labs:  None   Allergies   Allergies  Allergen Reactions   Hydrocodone Nausea And Vomiting   Other Nausea And Vomiting and Other (See Comments)    States all pain medications make her very ill- vomits   Tyloxapol Nausea And Vomiting   Oxycodone-Acetaminophen Nausea And Vomiting   Prednisone Rash and Other (See Comments)    Chest pain and a short-lived rash on the neck    Medications Prior to Visit:   Outpatient Medications Prior to Visit  Medication Sig Dispense Refill   amiodarone (PACERONE) 100 MG tablet Take 1 tablet (100 mg total) by mouth daily. 30 tablet 2   apixaban (ELIQUIS) 5 MG TABS tablet Take 1 tablet (5 mg total) by mouth 2 (two) times daily. 60 tablet 3   aspirin 81 MG chewable tablet Chew 1 tablet (81 mg total) by mouth daily. 30 tablet 0   atorvastatin (LIPITOR) 80 MG tablet TAKE 1 TABLET BY MOUTH DAILY AT 8pm (Patient taking differently: Take 80 mg by mouth at bedtime.) 30 tablet 3   Brimonidine Tartrate (LUMIFY) 0.025 % SOLN Place 1 drop into both eyes daily as needed (irritation/dry eyes).     dapagliflozin propanediol (FARXIGA) 10 MG TABS tablet Take 1 tablet (10 mg total) by mouth daily. 30 tablet 2   furosemide (LASIX) 20 MG tablet Take 1 tablet (20 mg total) by mouth daily. 30 tablet 3   losartan (COZAAR) 25 MG tablet Take 0.5 tablets (12.5 mg total) by mouth daily. (Patient taking differently: Take 12.5 mg by mouth at bedtime.) 45 tablet 3   metFORMIN (GLUCOPHAGE) 500 MG tablet Take 500 mg by mouth 2 (two) times daily.     nicotine (NICODERM CQ - DOSED IN MG/24 HOURS) 21 mg/24hr  patch Place 1 patch (21 mg total) onto the skin daily. 28 patch 3   pantoprazole (PROTONIX) 40 MG tablet Take 1 tablet (40 mg total) by mouth daily. 30 tablet 2   spironolactone (ALDACTONE) 25 MG tablet Take 0.5 tablets (12.5 mg total) by mouth daily. 15 tablet 3   No facility-administered medications prior to visit.   Final Medications at End of Visit    No outpatient medications have been marked as taking for the 10/07/21 encounter (Appointment) with Rayetta Pigg, Shizue Kaseman C, PA-C.   Radiology:   No results found.  Cardiac Studies:   Coronary arteriogram 09/21/2020: LV hemodynamics: 121/18, EDP 21 mmHg.  Ao 126/74, mean 97 mmHg.  No pressure gradient across the aortic valve. LV: EF 30 to 35% with mid to distal anterior, anteroapical, apical and inferior apical akinesis to dyskinesis. LM: Large-caliber vessel.  Smooth and normal. LAD: Large vessel, just immediately after the first septal perforator, the LAD is flush occluded.  Small diagonal arises at this segment.  The LAD otherwise is a large wraparound LAD and supplies large part of the apical and inferoapical region.  There are collaterals to the right coronary artery. CX: Moderate to large vessel, appears smooth and normal. RCA: Severely diseased diffusely, mid segment appears subtotally to totally occluded with TIMI I flow.  Distal bed of the RCA could not be seen due to minimal flow.  There are ipsilateral and contralateral collaterals to the distal right.   Intervention: Successful stenting of the proximal and mid segment of the LAD  with a long 3.0 x 24 mm Synergy XD DES following aspiration thrombectomy, stent deployed at a peak pressure of 18 atmospheric pressure, stenosis reduced from 100% to 0% with stepup and stepdown and TIMI 0 to TIMI-3 flow at the end of the procedure. I did attempt to cross the RCA however extremely difficult and appeared to behave like a CTO, lesion left alone.  Patient was completely asymptomatic throughout the  procedure. 135 mill contrast utilized.  Left heart catheterization 09/15/2021: LM: Normal LAD: Patent mid LAD stent RCA: Mid 100% occlusion          Left-to-right collaterals LVEDP 37 mmHg No acute occlusion Presentation likely due to flash pulmonary edema  Echocardiogram 09/16/2021:  1. Left ventricular ejection fraction, by estimation, is 25 to 30%. The left ventricle has severely decreased function. The left ventricle demonstrates regional wall motion abnormalities (see scoring diagram/findings for description). The left ventricular internal cavity size was mildly dilated. Left ventricular diastolic parameters are indeterminate. There is severe hypokinesis of the left ventricular, entire anteroseptal wall and anterior wall. There is akinesis of the left ventricular, apical segment.   2. Right ventricular systolic function is normal. The right ventricular size is normal.   3. The mitral valve is normal in structure. Mild mitral valve regurgitation. No evidence of mitral stenosis.   4. The aortic valve is normal in structure. Aortic valve regurgitation is not visualized. No aortic stenosis is present.   Comparison(s): Previous outpatient study on 03/10/2021 reported PASP 45 mmHg, grade III DD, mild LA dilatation-not appreciated on this study. EF reported 30-35% then.  EKG:  09/23/2021: Sinus bradycardia at a rate of 48 bpm.  Normal axis.  Cannot exclude anteroseptal infarct old.  Low voltage complexes.  Nonspecific ST-T wave abnormality.  EKG 09/15/2021: Sinus rhythm Anterolateral infarct, age indeterminate STE different from EKG on 09/01/2021  09/01/2021: Sinus bradycardia at a rate of 52 bpm.  Normal axis. Low voltage complexes, consider pulmonary disease pattern. Anterolateral infarct, age undetermined.  EKG unchanged compared to 11/04/2020.  10/02/2020: Sinus rhythm at a rate of 65 bpm.  Right axis Anterolateral infarct, age indeterminate.  Low voltage complexes, consider pulmonary disease  pattern.  09/28/2020: Afib w/RVR 199 bpm Anterolateral infarct, age indeterminate  09/21/2020: Normal sinus rhythm, normal axis, diffuse anterolateral and inferior STEMI.  Low-voltage complexes.  Assessment   No diagnosis found.    There are no discontinued medications.    No orders of the defined types were placed in this encounter.   This patients CHA2DS2-VASc Score 4 (MI, F, stroke)and yearly risk of stroke 4.8%.   Recommendations:   Alexandra Ward is a 61 y.o. female  patient with hyperlipidemia, ongoing tobacco use, type 2 DM, h/o stroke (vertebral artery dissection 2018), STEMI (09/21/2020) treated with PCI to prox LAD, presented again 09/28/2020 and atrial fibrillation with RVR and LVEF 30-35%.  He was started on amiodarone and Eliquis and discharged.  Patient was admitted 09/15/2021 - 09/17/2021 with acute onset of shortness of breath EKG showed old anterolateral infarct with ST elevation in V1-V3 new compared to EKG on 09/01/2021 chest x-ray showed flash pulmonary edema.  Patient underwent coronary angiography 09/15/2021 which showed no acute occlusion, therefore suspected flash pulmonary edema and spontaneous right-sided pneumothorax to be likely etiology of patient's respiratory failure.  Hospitalization patient required ventilatory support, extubated following diuresis.  Patient was subsequently discharged on guideline directed medical therapy for heart failure including carvedilol, Farxiga, losartan, and furosemide 20 mg as needed.  Patient also discharged  with midodrine 5 mg p.o. 3 times daily with meals.  In view of atrial fibrillation patient was discharged on amiodarone 100 mg p.o. daily and Eliquis as well as aspirin and atorvastatin given underlying CAD.  Notably echocardiogram during recent admission revealed LVEF reduced further to 25-30%.  Patient now presents for hospital follow-up.  Blood pressure has improved since discharge, however EKG today reveals patient is markedly  bradycardic.  We will discontinue carvedilol as well as midodrine.  Patient's weight has trended up since discharge, therefore advised patient to take Lasix 20 mg p.o. once daily.  We will repeat BMP and BMP in 1 week.  Reiterated the importance of tobacco cessation, DASH diet, diet and lifestyle modifications, as well as monitoring daily weights.  Follow-up in 2 weeks, sooner if needed.   Rayford Halsted, PA-C 10/06/2021, 2:34 PM Office: [phone]  Addendum 10/01/2021: External labs 09/30/2021: BUN 18, creatinine 1.06, GFR 60, sodium 140, potassium 4.2 proBNP 901   Rayford Halsted, PA-C 10/06/2021, 2:34 PM Office: [phone]

## 2021-10-06 NOTE — Progress Notes (Signed)
External labs 10/04/2021: BUN 18, creatinine 1.06, GFR 60, sodium 140, potassium 4.2 NT proBNP 901

## 2021-10-07 ENCOUNTER — Encounter: Payer: Self-pay | Admitting: Student

## 2021-10-07 ENCOUNTER — Ambulatory Visit: Payer: Self-pay | Admitting: Student

## 2021-10-07 VITALS — BP 106/70 | HR 68 | Temp 97.6°F | Resp 16 | Ht 64.0 in | Wt 175.2 lb

## 2021-10-07 DIAGNOSIS — I5042 Chronic combined systolic (congestive) and diastolic (congestive) heart failure: Secondary | ICD-10-CM

## 2021-10-07 DIAGNOSIS — I2510 Atherosclerotic heart disease of native coronary artery without angina pectoris: Secondary | ICD-10-CM

## 2021-10-07 DIAGNOSIS — I480 Paroxysmal atrial fibrillation: Secondary | ICD-10-CM

## 2021-10-07 MED ORDER — CARVEDILOL 6.25 MG PO TABS: 6.2500 mg | ORAL_TABLET | Freq: Every day | ORAL | 3 refills | Status: DC | PRN

## 2021-10-08 ENCOUNTER — Other Ambulatory Visit: Payer: Self-pay

## 2021-10-08 MED ORDER — DAPAGLIFLOZIN PROPANEDIOL 10 MG PO TABS: 10.0000 mg | ORAL_TABLET | Freq: Every day | ORAL | 3 refills | Status: DC

## 2021-10-12 ENCOUNTER — Other Ambulatory Visit (HOSPITAL_COMMUNITY): Payer: Self-pay

## 2021-10-14 ENCOUNTER — Telehealth: Payer: Self-pay | Admitting: Emergency Medicine

## 2021-10-14 NOTE — Telephone Encounter (Signed)
Copied from CRM [id number]. Topic: General - Other >> Oct 14, 2021 10:30 AM Turkey B wrote: Reason for GPQ:DIYMEB has hops fu rescheduled for 08/21.  She wants to know what all Dr Carleene Overlie do at this appt, since she has already be seeing a few Dr's.

## 2021-10-14 NOTE — Telephone Encounter (Signed)
Call placed to patient and VM was left informing patient to return phone call. 

## 2021-10-19 ENCOUNTER — Inpatient Hospital Stay: Payer: Self-pay | Admitting: Family Medicine

## 2021-10-19 ENCOUNTER — Other Ambulatory Visit: Payer: Self-pay

## 2021-10-19 ENCOUNTER — Telehealth: Payer: Self-pay

## 2021-10-19 MED ORDER — DAPAGLIFLOZIN PROPANEDIOL 10 MG PO TABS: 10.0000 mg | ORAL_TABLET | Freq: Every day | ORAL | 3 refills | Status: DC

## 2021-10-19 NOTE — Telephone Encounter (Signed)
Patient called and began to scream and was upset that her patient assistance form was not done. I had the form with a "faxed" stamp and I told her I would call them directly get clarification. Patient called several times while I was on the phone with [employer], also yelling at another MA staff. Called directed to OM.

## 2021-10-31 ENCOUNTER — Other Ambulatory Visit: Payer: Self-pay | Admitting: Student

## 2021-11-04 NOTE — Telephone Encounter (Signed)
Followed up with pt to see if she has gotten any answers/health insurance coverage. She reports she has not. States she almost died in 2022/09/16 and was in the hospital.  Reports that she has gotten some medication assistance for her Jardiance & Eliquis through the manufacturers. Reports that she did get family planning through [facility] but says they told her she had too many assets to qualify for medication. Aware I will send a message to social worker to follow up with pt to see if there is any options out there. Informed that it may be many weeks/month before I follow up with her. Patient verbalized understanding and agreeable to plan.

## 2021-11-22 ENCOUNTER — Inpatient Hospital Stay: Payer: Self-pay | Admitting: Family Medicine

## 2021-12-02 ENCOUNTER — Ambulatory Visit: Payer: Self-pay | Admitting: Student

## 2021-12-10 ENCOUNTER — Other Ambulatory Visit (HOSPITAL_COMMUNITY): Payer: Self-pay

## 2021-12-14 ENCOUNTER — Inpatient Hospital Stay: Payer: Self-pay | Admitting: Family Medicine

## 2022-01-07 ENCOUNTER — Encounter: Payer: Self-pay | Admitting: Cardiology

## 2022-01-07 ENCOUNTER — Ambulatory Visit: Payer: Self-pay | Admitting: Cardiology

## 2022-01-07 VITALS — BP 111/68 | HR 74 | Temp 97.7°F | Resp 16 | Ht 64.0 in | Wt 180.0 lb

## 2022-01-07 DIAGNOSIS — I5042 Chronic combined systolic (congestive) and diastolic (congestive) heart failure: Secondary | ICD-10-CM

## 2022-01-07 DIAGNOSIS — I2510 Atherosclerotic heart disease of native coronary artery without angina pectoris: Secondary | ICD-10-CM

## 2022-01-07 DIAGNOSIS — I480 Paroxysmal atrial fibrillation: Secondary | ICD-10-CM

## 2022-01-07 MED ORDER — SPIRONOLACTONE 25 MG PO TABS: 12.5000 mg | ORAL_TABLET | Freq: Every day | ORAL | 3 refills | Status: DC

## 2022-01-07 NOTE — Progress Notes (Signed)
Follow up visit  Subjective:   Alexandra Ward, female    DOB: 02/22/1961, 61 y.o.   MRN: [id number]   HPI  Chief Complaint  Patient presents with   Coronary Artery Disease   Atrial Fibrillation   Congestive Heart Failure   Follow-up    3 month   61 y/o Caucasian female with hyperlipidemia, type 2 DM, h/o stroke (vertebral artery dissection 2018), STEMI (09/21/2020) treated with PPCI to prox LAD, HFrEF, PAF, h/o spontaneous Rt pneumothorax (09/2021)  Patient denies any overt shortness of breath symptoms, denies chest pain.  Also denies leg edema, orthopnea, PND.  She did not tolerate carvedilol due to hair loss and fatigue, and is not willing to try another beta-blocker at this time.  She also was not able to start [facility] as previously recommended, due to borderline low blood pressure. She saw Dr Curt Bears in 01/2021 and discussed Afib ablation. She did not pursue as she did not have insurance, and still does not have insurance.     Current Outpatient Medications:    amiodarone (PACERONE) 200 MG tablet, Take 100 mg by mouth daily., Disp: , Rfl:    apixaban (ELIQUIS) 5 MG TABS tablet, Take 1 tablet (5 mg total) by mouth 2 (two) times daily., Disp: 60 tablet, Rfl: 3   aspirin 81 MG chewable tablet, Chew 1 tablet (81 mg total) by mouth daily., Disp: 30 tablet, Rfl: 0   atorvastatin (LIPITOR) 80 MG tablet, TAKE 1 TABLET BY MOUTH DAILY AT 8pm, Disp: 30 tablet, Rfl: 3   Brimonidine Tartrate (LUMIFY) 0.025 % SOLN, Place 1 drop into both eyes daily as needed (irritation/dry eyes)., Disp: , Rfl:    dapagliflozin propanediol (FARXIGA) 10 MG TABS tablet, Take 1 tablet (10 mg total) by mouth daily., Disp: 90 tablet, Rfl: 3   furosemide (LASIX) 20 MG tablet, Take 1 tablet (20 mg total) by mouth daily., Disp: 30 tablet, Rfl: 3   losartan (COZAAR) 25 MG tablet, Take 0.5 tablets (12.5 mg total) by mouth daily. (Patient taking differently: Take 12.5 mg by mouth at bedtime.), Disp: 45 tablet, Rfl: 3    metFORMIN (GLUCOPHAGE) 500 MG tablet, Take 500 mg by mouth 2 (two) times daily., Disp: , Rfl:    nicotine (NICODERM CQ - DOSED IN MG/24 HOURS) 21 mg/24hr patch, Place 1 patch (21 mg total) onto the skin daily., Disp: 28 patch, Rfl: 3   pantoprazole (PROTONIX) 40 MG tablet, Take 1 tablet (40 mg total) by mouth daily., Disp: 30 tablet, Rfl: 2   spironolactone (ALDACTONE) 25 MG tablet, Take 0.5 tablets (12.5 mg total) by mouth daily., Disp: 15 tablet, Rfl: 3   Cardiovascular & other pertient studies:  Reviewed external labs and tests, independently interpreted  EKG 10/07/2021: Sinus rhythm 61 bpm Anterior infarct, age indeterminate  Echocardiogram 09/16/2021: 1. Left ventricular ejection fraction, by estimation, is 25 to 30%. The  left ventricle has severely decreased function. The left ventricle  demonstrates regional wall motion abnormalities (see scoring  diagram/findings for description). The left  ventricular internal cavity size was mildly dilated. Left ventricular  diastolic parameters are indeterminate. There is severe hypokinesis of the  left ventricular, entire anteroseptal wall and anterior wall. There is  akinesis of the left ventricular,  apical segment.   2. Right ventricular systolic function is normal. The right ventricular  size is normal.   3. The mitral valve is normal in structure. Mild mitral valve  regurgitation. No evidence of mitral stenosis.   4. The aortic  valve is normal in structure. Aortic valve regurgitation is  not visualized. No aortic stenosis is present.   Comparison(s): Previous outpatient study on 03/10/2021 reported PASP 45  mmHg, grade III DD, mild LA dilatation-not appreciated on this study. EF  reported 30-35% then.   Coronary angiography 09/15/2021: LM: Normal LAD: Patent mid LAD stent RCA: Mid 100% occlusion          Left-to-right collaterals   LVEDP 37 mmHg   No acute occlusion Presentation likely due to flash pulmonary edema  Coronary  arteriogram 09/21/2020: LV hemodynamics: 121/18, EDP 21 mmHg.  Ao 126/74, mean 97 mmHg.  No pressure gradient across the aortic valve. LV: EF 30 to 35% with mid to distal anterior, anteroapical, apical and inferior apical akinesis to dyskinesis. LM: Large-caliber vessel.  Smooth and normal. LAD: Large vessel, just immediately after the first septal perforator, the LAD is flush occluded.  Small diagonal arises at this segment.  The LAD otherwise is a large wraparound LAD and supplies large part of the apical and inferoapical region.  There are collaterals to the right coronary artery. CX: Moderate to large vessel, appears smooth and normal. RCA: Severely diseased diffusely, mid segment appears subtotally to totally occluded with TIMI I flow.  Distal bed of the RCA could not be seen due to minimal flow.  There are ipsilateral and contralateral collaterals to the distal right.   Intervention: Successful stenting of the proximal and mid segment of the LAD with a long 3.0 x 24 mm Synergy XD DES following aspiration thrombectomy, stent deployed at a peak pressure of 18 atmospheric pressure, stenosis reduced from 100% to 0% with stepup and stepdown and TIMI 0 to TIMI-3 flow at the end of the procedure. I did attempt to cross the RCA however extremely difficult and appeared to behave like a CTO, lesion left alone.  Patient was completely asymptomatic throughout the procedure. 135 mill contrast utilized.  Recent labs: 09/17/2021: Glucose 98, BUN/Cr 12/0.76. EGFR >60. Na/K 139/4.1. Albumin 3.3. Rest of the CMP normal H/H 12/36. MCV 88. Platelets 252 HbA1C 6.0% Chol 132, TG 111, HDL 36, LDL 74 Lipoprotein (a) 11 TSH 5.7    Review of Systems  Cardiovascular:  Negative for chest pain, dyspnea on exertion, leg swelling, palpitations and syncope.       Vitals:   01/07/22 1009  BP: 111/68  Pulse: 74  Resp: 16  Temp: 97.7 F (36.5 C)  SpO2: 95%    Body mass index is 30.9 kg/m. Filed Weights    01/07/22 1009  Weight: 180 lb (81.6 kg)     Objective:   Physical Exam Vitals and nursing note reviewed.  Constitutional:      General: She is not in acute distress. Neck:     Vascular: No JVD.  Cardiovascular:     Rate and Rhythm: Normal rate and regular rhythm.     Heart sounds: Normal heart sounds. No murmur heard. Pulmonary:     Effort: Pulmonary effort is normal.     Breath sounds: Normal breath sounds. No wheezing or rales.  Musculoskeletal:     Right lower leg: No edema.     Left lower leg: No edema.          Visit diagnoses:   ICD-10-CM   1. Coronary artery disease involving native coronary artery of native heart without angina pectoris  I25.10     2. Chronic combined systolic and diastolic heart failure (HCC)  I50.42 TSH    Comprehensive Metabolic Panel (  CMET)    B Nat Peptide    3. Paroxysmal atrial fibrillation (HCC)  I48.0        Orders Placed This Encounter  Procedures   TSH   Comprehensive Metabolic Panel (CMET)   B Nat Peptide    Meds ordered this encounter  Medications   spironolactone (ALDACTONE) 25 MG tablet    Sig: Take 0.5 tablets (12.5 mg total) by mouth daily.    Dispense:  45 tablet    Refill:  3     Assessment & Recommendations:   61 y/o Caucasian female with hyperlipidemia, type 2 DM, h/o stroke (vertebral artery dissection 2018), STEMI (09/21/2020) treated with PPCI to prox LAD, HFrEF, PAF, h/o spontaneous Rt pneumothorax (09/2021)  CAD: No angina symptoms. Discontinued Aspirin due to ongoing use of Eliquis.   HFrEF: Clinically euvolemic. EF remained 25-30%. That said she is not on optimal medical therapy.  She remains unwilling to add beta blocker. Or change losartan to Entresto.  She remains reluctant to consider Afib ablation or ICD until after she has insurance. Offered her information re: healthcare.gov   PAF: Maintains sinus rhythm on amiodarone 100 mg daily. Check TSH Not ready for ablation until after she gets  insurance.  F/u in 3 months    Nigel Mormon, MD Pager: [phone] Office: [phone]

## 2022-01-10 ENCOUNTER — Other Ambulatory Visit: Payer: Self-pay | Admitting: Cardiology

## 2022-01-11 ENCOUNTER — Other Ambulatory Visit: Payer: Self-pay

## 2022-01-11 MED ORDER — LOSARTAN POTASSIUM 25 MG PO TABS: 12.5000 mg | ORAL_TABLET | Freq: Every day | ORAL | 1 refills | Status: DC

## 2022-01-14 ENCOUNTER — Other Ambulatory Visit (HOSPITAL_COMMUNITY): Payer: Self-pay

## 2022-01-19 LAB — COMPREHENSIVE METABOLIC PANEL
ALT: 20 IU/L (ref 0–32)
AST: 23 IU/L (ref 0–40)
Albumin/Globulin Ratio: 1.5 (ref 1.2–2.2)
Albumin: 4.6 g/dL (ref 3.9–4.9)
Alkaline Phosphatase: 74 IU/L (ref 44–121)
BUN/Creatinine Ratio: 19 (ref 12–28)
BUN: 22 mg/dL (ref 8–27)
Bilirubin Total: <0.2 mg/dL (ref 0.0–1.2)
CO2: 21 mmol/L (ref 20–29)
Calcium: 9.2 mg/dL (ref 8.7–10.3)
Chloride: 102 mmol/L (ref 96–106)
Creatinine, Ser: 1.14 mg/dL — ABNORMAL HIGH (ref 0.57–1.00)
Globulin, Total: 3 g/dL (ref 1.5–4.5)
Glucose: 84 mg/dL (ref 70–99)
Potassium: 4.2 mmol/L (ref 3.5–5.2)
Sodium: 141 mmol/L (ref 134–144)
Total Protein: 7.6 g/dL (ref 6.0–8.5)
eGFR: 54 mL/min/{1.73_m2} — ABNORMAL LOW (ref >59)

## 2022-01-19 LAB — BRAIN NATRIURETIC PEPTIDE: BNP: 118.5 pg/mL — ABNORMAL HIGH (ref 0.0–100.0)

## 2022-01-19 LAB — TSH: TSH: 4.72 u[IU]/mL — ABNORMAL HIGH (ref 0.450–4.500)

## 2022-02-01 ENCOUNTER — Other Ambulatory Visit: Payer: Self-pay

## 2022-02-01 DIAGNOSIS — I480 Paroxysmal atrial fibrillation: Secondary | ICD-10-CM

## 2022-02-01 MED ORDER — AMIODARONE HCL 100 MG PO TABS: 100.0000 mg | ORAL_TABLET | Freq: Every day | ORAL | 5 refills | Status: DC

## 2022-02-01 NOTE — Progress Notes (Signed)
Patient taking 100mg  of amiodarone. Changed strength for patient convenience.

## 2022-04-14 ENCOUNTER — Encounter: Payer: Self-pay | Admitting: Cardiology

## 2022-04-14 ENCOUNTER — Ambulatory Visit: Payer: Self-pay | Admitting: Cardiology

## 2022-04-14 VITALS — BP 98/60 | HR 59 | Resp 16 | Ht 64.0 in | Wt 177.0 lb

## 2022-04-14 DIAGNOSIS — I2510 Atherosclerotic heart disease of native coronary artery without angina pectoris: Secondary | ICD-10-CM

## 2022-04-14 DIAGNOSIS — I480 Paroxysmal atrial fibrillation: Secondary | ICD-10-CM

## 2022-04-14 MED ORDER — METOPROLOL SUCCINATE ER 25 MG PO TB24: 12.5000 mg | ORAL_TABLET | Freq: Every day | ORAL | 3 refills | Status: DC

## 2022-04-14 NOTE — Progress Notes (Signed)
Follow up visit  Subjective:   Alexandra Ward, female    DOB: 1961-01-14, 62 y.o.   MRN: [id number]   HPI  Chief Complaint  Patient presents with   Atrial Fibrillation   Follow-up   62 y/o Caucasian female with hyperlipidemia, type 2 DM, h/o stroke (vertebral artery dissection 2018), STEMI (09/21/2020) treated with PPCI to prox LAD, HFrEF, PAF, h/o spontaneous Rt pneumothorax (09/2021)  Patient has had occasional episodes of retrosternal burning and palpitations.     Current Outpatient Medications:    amiodarone (PACERONE) 100 MG tablet, Take 1 tablet (100 mg total) by mouth daily. (Patient taking differently: Take 100 mg by mouth 2 (two) times daily.), Disp: 30 tablet, Rfl: 5   apixaban (ELIQUIS) 5 MG TABS tablet, Take 1 tablet (5 mg total) by mouth 2 (two) times daily., Disp: 60 tablet, Rfl: 3   atorvastatin (LIPITOR) 80 MG tablet, TAKE 1 TABLET BY MOUTH DAILY AT 8pm, Disp: 30 tablet, Rfl: 3   Brimonidine Tartrate (LUMIFY) 0.025 % SOLN, Place 1 drop into both eyes daily as needed (irritation/dry eyes)., Disp: , Rfl:    dapagliflozin propanediol (FARXIGA) 10 MG TABS tablet, Take 1 tablet (10 mg total) by mouth daily., Disp: 90 tablet, Rfl: 3   furosemide (LASIX) 20 MG tablet, Take 1 tablet (20 mg total) by mouth daily., Disp: 30 tablet, Rfl: 3   losartan (COZAAR) 25 MG tablet, Take 0.5 tablets (12.5 mg total) by mouth daily., Disp: 45 tablet, Rfl: 1   metFORMIN (GLUCOPHAGE) 500 MG tablet, Take 500 mg by mouth 2 (two) times daily., Disp: , Rfl:    spironolactone (ALDACTONE) 25 MG tablet, Take 0.5 tablets (12.5 mg total) by mouth daily., Disp: 45 tablet, Rfl: 3   Cardiovascular & other pertient studies:  Reviewed external labs and tests, independently interpreted  EKG 04/14/2022: Sinus rhythm 69 bpm Old anterior infarct Nonspecific T-abnormality Low voltage  Echocardiogram 09/16/2021: 1. Left ventricular ejection fraction, by estimation, is 25 to 30%. The  left ventricle has  severely decreased function. The left ventricle  demonstrates regional wall motion abnormalities (see scoring  diagram/findings for description). The left  ventricular internal cavity size was mildly dilated. Left ventricular  diastolic parameters are indeterminate. There is severe hypokinesis of the  left ventricular, entire anteroseptal wall and anterior wall. There is  akinesis of the left ventricular,  apical segment.   2. Right ventricular systolic function is normal. The right ventricular  size is normal.   3. The mitral valve is normal in structure. Mild mitral valve  regurgitation. No evidence of mitral stenosis.   4. The aortic valve is normal in structure. Aortic valve regurgitation is  not visualized. No aortic stenosis is present.   Comparison(s): Previous outpatient study on 03/10/2021 reported PASP 45  mmHg, grade III DD, mild LA dilatation-not appreciated on this study. EF  reported 30-35% then.   Coronary angiography 09/15/2021: LM: Normal LAD: Patent mid LAD stent RCA: Mid 100% occlusion          Left-to-right collaterals   LVEDP 37 mmHg   No acute occlusion Presentation likely due to flash pulmonary edema  Coronary arteriogram 09/21/2020: LV hemodynamics: 121/18, EDP 21 mmHg.  Ao 126/74, mean 97 mmHg.  No pressure gradient across the aortic valve. LV: EF 30 to 35% with mid to distal anterior, anteroapical, apical and inferior apical akinesis to dyskinesis. LM: Large-caliber vessel.  Smooth and normal. LAD: Large vessel, just immediately after the first septal perforator, the LAD is  flush occluded.  Small diagonal arises at this segment.  The LAD otherwise is a large wraparound LAD and supplies large part of the apical and inferoapical region.  There are collaterals to the right coronary artery. CX: Moderate to large vessel, appears smooth and normal. RCA: Severely diseased diffusely, mid segment appears subtotally to totally occluded with TIMI I flow.  Distal bed of  the RCA could not be seen due to minimal flow.  There are ipsilateral and contralateral collaterals to the distal right.   Intervention: Successful stenting of the proximal and mid segment of the LAD with a long 3.0 x 24 mm Synergy XD DES following aspiration thrombectomy, stent deployed at a peak pressure of 18 atmospheric pressure, stenosis reduced from 100% to 0% with stepup and stepdown and TIMI 0 to TIMI-3 flow at the end of the procedure. I did attempt to cross the RCA however extremely difficult and appeared to behave like a CTO, lesion left alone.  Patient was completely asymptomatic throughout the procedure. 135 mill contrast utilized.  Recent labs: 01/18/2022: Glucose 84, BUN/Cr 22/1.14. EGFR 54. Na/K 141/4.2. Rest of the CMP normal BNP 118 H/H 11/36. MCV 88. Platelets 252 TSH 4.7 normal  09/17/2021: Glucose 98, BUN/Cr 12/0.76. EGFR >60. Na/K 139/4.1. Albumin 3.3. Rest of the CMP normal H/H 12/36. MCV 88. Platelets 252 HbA1C 6.0% Chol 132, TG 111, HDL 36, LDL 74 Lipoprotein (a) 11 TSH 5.7    Review of Systems  Cardiovascular:  Positive for chest pain and palpitations. Negative for dyspnea on exertion, leg swelling and syncope.       Vitals:   04/14/22 0929  BP: 98/60  Pulse: (!) 59  Resp: 16  SpO2: 96%    Body mass index is 30.38 kg/m. Filed Weights   04/14/22 0929  Weight: 177 lb (80.3 kg)     Objective:   Physical Exam Vitals and nursing note reviewed.  Constitutional:      General: She is not in acute distress. Neck:     Vascular: No JVD.  Cardiovascular:     Rate and Rhythm: Normal rate and regular rhythm.     Heart sounds: Normal heart sounds. No murmur heard. Pulmonary:     Effort: Pulmonary effort is normal.     Breath sounds: Normal breath sounds. No wheezing or rales.  Musculoskeletal:     Right lower leg: No edema.     Left lower leg: No edema.          Visit diagnoses:   ICD-10-CM   1. Paroxysmal atrial fibrillation (HCC)  I48.0  EKG 12-Lead    2. Coronary artery disease involving native coronary artery of native heart without angina pectoris  I25.10        Orders Placed This Encounter  Procedures   EKG 12-Lead    Meds ordered this encounter  Medications   metoprolol succinate (TOPROL-XL) 25 MG 24 hr tablet    Sig: Take 0.5 tablets (12.5 mg total) by mouth daily. Take with or immediately following a meal.    Dispense:  30 tablet    Refill:  3     Assessment & Recommendations:   62 y/o Caucasian female with hyperlipidemia, type 2 DM, h/o stroke (vertebral artery dissection 2018), STEMI (09/21/2020) treated with PPCI to prox LAD, HFrEF, PAF, h/o spontaneous Rt pneumothorax (09/2021)  CAD: Occasional retrosternal symptoms, possibly angina. Not on Aspirin due to ongoing use of Eliquis.   HFrEF: Clinically euvolemic. EF remained 25-30% (09/2021). Added metoprolol succinate 12.5  mg daily. Continue losartan, spironolactone, Marcelline Deist. Blood pressures low normal. She remains reluctant to consider Afib ablation or ICD until after she has insurance. Offered her information re: healthcare.gov   PAF: Recent episodes of palpitations, possibly Afib.  Added metoprolol succinate 12.5 mg daily. If symptoms don't improve, will check cardiac telemetry.  Continue amiodarone 100 mg daily.  F/u in 3 months    Elder Negus, MD Pager: [phone] Office: [phone]

## 2022-06-23 ENCOUNTER — Ambulatory Visit: Payer: Medicaid Other | Admitting: Cardiology

## 2022-06-25 ENCOUNTER — Other Ambulatory Visit: Payer: Self-pay | Admitting: Cardiology

## 2022-07-05 ENCOUNTER — Other Ambulatory Visit: Payer: Self-pay | Admitting: Cardiology

## 2022-07-11 ENCOUNTER — Ambulatory Visit: Payer: Medicaid Other | Admitting: Cardiology

## 2022-07-11 ENCOUNTER — Encounter: Payer: Self-pay | Admitting: Cardiology

## 2022-07-11 VITALS — BP 128/58 | HR 68 | Ht 64.0 in | Wt 187.4 lb

## 2022-07-11 DIAGNOSIS — I480 Paroxysmal atrial fibrillation: Secondary | ICD-10-CM

## 2022-07-11 DIAGNOSIS — I5042 Chronic combined systolic (congestive) and diastolic (congestive) heart failure: Secondary | ICD-10-CM

## 2022-07-11 MED ORDER — ENTRESTO 24-26 MG PO TABS: 1.0000 | ORAL_TABLET | Freq: Two times a day (BID) | ORAL | 3 refills | Status: DC

## 2022-07-11 NOTE — Progress Notes (Signed)
Follow up visit  Subjective:   Alexandra Ward, female    DOB: February 16, 1961, 62 y.o.   MRN: [id number]   HPI  Chief Complaint  Patient presents with   Paroxysmal atrial fibrillation [facility])   Follow-up   62 y/o Caucasian female with hyperlipidemia, type 2 DM, h/o stroke (vertebral artery dissection 2018), STEMI (09/21/2020) treated with PPCI to prox LAD, HFrEF, PAF, h/o spontaneous Rt pneumothorax (09/2021)  No recent chest pain or dyspnea symptoms. She wants to start [facility] for weight loss. BMI is 32.     Current Outpatient Medications:    amiodarone (PACERONE) 100 MG tablet, Take 1 tablet (100 mg total) by mouth daily. (Patient taking differently: Take 100 mg by mouth 2 (two) times daily.), Disp: 30 tablet, Rfl: 5   apixaban (ELIQUIS) 5 MG TABS tablet, Take 1 tablet (5 mg total) by mouth 2 (two) times daily., Disp: 60 tablet, Rfl: 3   atorvastatin (LIPITOR) 80 MG tablet, TAKE 1 TABLET BY MOUTH DAILY AT 8pm, Disp: 30 tablet, Rfl: 3   Brimonidine Tartrate (LUMIFY) 0.025 % SOLN, Place 1 drop into both eyes daily as needed (irritation/dry eyes)., Disp: , Rfl:    dapagliflozin propanediol (FARXIGA) 10 MG TABS tablet, Take 1 tablet (10 mg total) by mouth daily., Disp: 90 tablet, Rfl: 3   furosemide (LASIX) 20 MG tablet, Take 1 tablet (20 mg total) by mouth daily., Disp: 30 tablet, Rfl: 3   losartan (COZAAR) 25 MG tablet, TAKE 1/2 TABLET BY MOUTH DAILY, Disp: 45 tablet, Rfl: 1   metFORMIN (GLUCOPHAGE) 500 MG tablet, Take 500 mg by mouth 2 (two) times daily., Disp: , Rfl:    metoprolol succinate (TOPROL-XL) 25 MG 24 hr tablet, Take 0.5 tablets (12.5 mg total) by mouth daily. Take with or immediately following a meal., Disp: 30 tablet, Rfl: 3   spironolactone (ALDACTONE) 25 MG tablet, Take 0.5 tablets (12.5 mg total) by mouth daily., Disp: 45 tablet, Rfl: 3   Cardiovascular & other pertient studies:  Reviewed external labs and tests, independently interpreted  EKG 07/11/2022: Sinus rhythm 57  bpm Low voltage in limb leads Old anterior infarct Nonspecific T-abnormality  Echocardiogram 09/16/2021: 1. Left ventricular ejection fraction, by estimation, is 25 to 30%. The  left ventricle has severely decreased function. The left ventricle  demonstrates regional wall motion abnormalities (see scoring  diagram/findings for description). The left  ventricular internal cavity size was mildly dilated. Left ventricular  diastolic parameters are indeterminate. There is severe hypokinesis of the  left ventricular, entire anteroseptal wall and anterior wall. There is  akinesis of the left ventricular,  apical segment.   2. Right ventricular systolic function is normal. The right ventricular  size is normal.   3. The mitral valve is normal in structure. Mild mitral valve  regurgitation. No evidence of mitral stenosis.   4. The aortic valve is normal in structure. Aortic valve regurgitation is  not visualized. No aortic stenosis is present.   Comparison(s): Previous outpatient study on 03/10/2021 reported PASP 45  mmHg, grade III DD, mild LA dilatation-not appreciated on this study. EF  reported 30-35% then.   Coronary angiography 09/15/2021: LM: Normal LAD: Patent mid LAD stent RCA: Mid 100% occlusion          Left-to-right collaterals   LVEDP 37 mmHg   No acute occlusion Presentation likely due to flash pulmonary edema  Coronary arteriogram 09/21/2020: LV hemodynamics: 121/18, EDP 21 mmHg.  Ao 126/74, mean 97 mmHg.  No pressure gradient across  the aortic valve. LV: EF 30 to 35% with mid to distal anterior, anteroapical, apical and inferior apical akinesis to dyskinesis. LM: Large-caliber vessel.  Smooth and normal. LAD: Large vessel, just immediately after the first septal perforator, the LAD is flush occluded.  Small diagonal arises at this segment.  The LAD otherwise is a large wraparound LAD and supplies large part of the apical and inferoapical region.  There are collaterals to  the right coronary artery. CX: Moderate to large vessel, appears smooth and normal. RCA: Severely diseased diffusely, mid segment appears subtotally to totally occluded with TIMI I flow.  Distal bed of the RCA could not be seen due to minimal flow.  There are ipsilateral and contralateral collaterals to the distal right.   Intervention: Successful stenting of the proximal and mid segment of the LAD with a long 3.0 x 24 mm Synergy XD DES following aspiration thrombectomy, stent deployed at a peak pressure of 18 atmospheric pressure, stenosis reduced from 100% to 0% with stepup and stepdown and TIMI 0 to TIMI-3 flow at the end of the procedure. I did attempt to cross the RCA however extremely difficult and appeared to behave like a CTO, lesion left alone.  Patient was completely asymptomatic throughout the procedure. 135 mill contrast utilized.  Recent labs: 01/18/2022: Glucose 84, BUN/Cr 22/1.14. EGFR 54. Na/K 141/4.2. Rest of the CMP normal BNP 118 H/H 11/36. MCV 88. Platelets 252 TSH 4.7 normal  09/17/2021: Glucose 98, BUN/Cr 12/0.76. EGFR >60. Na/K 139/4.1. Albumin 3.3. Rest of the CMP normal H/H 12/36. MCV 88. Platelets 252 HbA1C 6.0% Chol 132, TG 111, HDL 36, LDL 74 Lipoprotein (a) 11 TSH 5.7    Review of Systems  Cardiovascular:  Negative for chest pain, dyspnea on exertion, leg swelling, palpitations and syncope.       Vitals:   07/11/22 1147  BP: (!) 128/58  Pulse: 68    Body mass index is 32.17 kg/m. Filed Weights   07/11/22 1147  Weight: 187 lb 6.4 oz (85 kg)     Objective:   Physical Exam Vitals and nursing note reviewed.  Constitutional:      General: She is not in acute distress. Neck:     Vascular: No JVD.  Cardiovascular:     Rate and Rhythm: Normal rate and regular rhythm.     Heart sounds: Normal heart sounds. No murmur heard. Pulmonary:     Effort: Pulmonary effort is normal.     Breath sounds: Normal breath sounds. No wheezing or rales.   Musculoskeletal:     Right lower leg: No edema.     Left lower leg: No edema.          Visit diagnoses:   ICD-10-CM   1. Paroxysmal atrial fibrillation  I48.0 EKG 12-Lead       Orders Placed This Encounter  Procedures   EKG 12-Lead    No orders of the defined types were placed in this encounter.    Assessment & Recommendations:   62 y/o Caucasian female with hyperlipidemia, type 2 DM, h/o stroke (vertebral artery dissection 2018), STEMI (09/21/2020) treated with PPCI to prox LAD, HFrEF, PAF, h/o spontaneous Rt pneumothorax (09/2021)  CAD: No angina symptoms. Not on Aspirin due to ongoing use of Eliquis.   HFrEF: Clinically euvolemic. EF remained 25-30% (09/2021). Change losartan to Entresto 24-26 mg bid today, now that the blood pressures are higher. Continue metoprolol succinate 12.5 mg daily, spironolactone, Marcelline Deist. Labs in 1 week (CMP) Echocardiogram in [place]. F/u  in July 2024.   Obesity: Discussed diet and lifestyle modifications first. She could certainly do better with exercise. If not improved, I think Ozempic will be more likely to approved by insurance given CAD, obesity.  Can readdress at next visit.   PAF: No recent symptoms. Continue amiodarone 200 mg daily. (CMP, TSH)  F/u in 3 months    Elder NegusManish J Ginette Bradway, MD Pager: [phone] Office: [phone]

## 2022-07-21 ENCOUNTER — Other Ambulatory Visit: Payer: Self-pay

## 2022-07-21 ENCOUNTER — Telehealth: Payer: Self-pay | Admitting: Cardiology

## 2022-07-21 NOTE — Telephone Encounter (Signed)
Pt is calling regarding a previous talk that she had with Dr. Elberta Fortis regarding a procedure for A-Fib. She states at the time she did not have insurance, so the procedure was not done but she has insurance now and would like to proceed. Please advise.

## 2022-07-22 NOTE — Telephone Encounter (Signed)
Attempted to reach pt, no answer, voicemail full unable to leave message

## 2022-07-28 NOTE — Telephone Encounter (Signed)
Patient is returning phone call.  °

## 2022-07-29 NOTE — Telephone Encounter (Signed)
Left message to call back  

## 2022-08-03 NOTE — Telephone Encounter (Signed)
Left message to call back  

## 2022-08-17 LAB — BASIC METABOLIC PANEL
BUN/Creatinine Ratio: 12 (ref 12–28)
BUN: 11 mg/dL (ref 8–27)
CO2: 25 mmol/L (ref 20–29)
Calcium: 9.4 mg/dL (ref 8.7–10.3)
Chloride: 104 mmol/L (ref 96–106)
Creatinine, Ser: 0.95 mg/dL (ref 0.57–1.00)
Glucose: 102 mg/dL — ABNORMAL HIGH (ref 70–99)
Potassium: 4.9 mmol/L (ref 3.5–5.2)
Sodium: 142 mmol/L (ref 134–144)
eGFR: 68 mL/min/{1.73_m2} (ref >59)

## 2022-08-17 LAB — BRAIN NATRIURETIC PEPTIDE: BNP: 164.3 pg/mL — ABNORMAL HIGH (ref 0.0–100.0)

## 2022-08-23 ENCOUNTER — Telehealth: Payer: Self-pay | Admitting: Cardiology

## 2022-08-23 DIAGNOSIS — I480 Paroxysmal atrial fibrillation: Secondary | ICD-10-CM

## 2022-08-23 NOTE — Telephone Encounter (Signed)
Patient is requesting to speak with RN Sherri in regards to scheduling an ablation.

## 2022-08-23 NOTE — Telephone Encounter (Signed)
Patient is returning call.  °

## 2022-08-23 NOTE — Telephone Encounter (Signed)
Left message to call back   (Informed pt this would be my last attempt to reach her after leaving several messages.  Will mail letter if do not hear back by the end of the week.)

## 2022-08-23 NOTE — Telephone Encounter (Signed)
Pt would like to hold afib ablation date for 10/01 w/ Camnitz.  Aware scheduler will reach out to arrange OV w/ Camnitz to ensure ok to proceed w/ ablation. Aware that appt may be a month/two.  Patient verbalized understanding and agreeable to plan.

## 2022-08-23 NOTE — Telephone Encounter (Signed)
Spoke to pt. Pt is going to call her insurance to ensure procedure is covered.  CPT code given  to pt. She will call office back if would like to schedule appt w/ Camnitz to discuss ablation. She appreciates my return call.

## 2022-08-24 ENCOUNTER — Other Ambulatory Visit: Payer: Self-pay

## 2022-08-24 DIAGNOSIS — I480 Paroxysmal atrial fibrillation: Secondary | ICD-10-CM

## 2022-08-24 NOTE — Addendum Note (Signed)
Addended by: Cleda Mccreedy on: 08/24/2022 09:49 AM   Modules accepted: Orders

## 2022-08-24 NOTE — Telephone Encounter (Signed)
Pt is scheduled for an Afib Ablation on 10/1 @ 10:30.   She has a f/u with Dr. Elberta Fortis on 10/28/22.Marland Kitchen  She will have labs done at [facility] in [place].  Orders have been entered and released.

## 2022-09-23 ENCOUNTER — Other Ambulatory Visit: Payer: Self-pay

## 2022-09-23 MED ORDER — APIXABAN 5 MG PO TABS: 5.0000 mg | ORAL_TABLET | Freq: Two times a day (BID) | ORAL | 3 refills | Status: AC

## 2022-09-23 MED ORDER — DAPAGLIFLOZIN PROPANEDIOL 10 MG PO TABS: 10.0000 mg | ORAL_TABLET | Freq: Every day | ORAL | 0 refills | Status: DC

## 2022-09-27 LAB — CBC
Hematocrit: 42.5 % (ref 34.0–46.6)
Hemoglobin: 14.1 g/dL (ref 11.1–15.9)
MCH: 29.5 pg (ref 26.6–33.0)
MCHC: 33.2 g/dL (ref 31.5–35.7)
MCV: 89 fL (ref 79–97)
Platelets: 256 10*3/uL (ref 150–450)
RBC: 4.78 x10E6/uL (ref 3.77–5.28)
RDW: 14.5 % (ref 11.7–15.4)
WBC: 12.4 10*3/uL — ABNORMAL HIGH (ref 3.4–10.8)

## 2022-09-28 ENCOUNTER — Telehealth: Payer: Self-pay | Admitting: Cardiology

## 2022-09-28 DIAGNOSIS — Z01812 Encounter for preprocedural laboratory examination: Secondary | ICD-10-CM

## 2022-09-28 DIAGNOSIS — I480 Paroxysmal atrial fibrillation: Secondary | ICD-10-CM

## 2022-09-28 LAB — BASIC METABOLIC PANEL
BUN/Creatinine Ratio: 12 (ref 12–28)
BUN: 13 mg/dL (ref 8–27)
CO2: 23 mmol/L (ref 20–29)
Calcium: 9.9 mg/dL (ref 8.7–10.3)
Chloride: 103 mmol/L (ref 96–106)
Creatinine, Ser: 1.05 mg/dL — ABNORMAL HIGH (ref 0.57–1.00)
Glucose: 98 mg/dL (ref 70–99)
Potassium: 4.8 mmol/L (ref 3.5–5.2)
Sodium: 142 mmol/L (ref 134–144)
eGFR: 60 mL/min/{1.73_m2} (ref >59)

## 2022-09-28 NOTE — Telephone Encounter (Signed)
Left message to call back  

## 2022-09-28 NOTE — Telephone Encounter (Signed)
Patient calling in about her blood work. Please advise

## 2022-09-29 NOTE — Telephone Encounter (Signed)
Preliminary result given to pt. Aware that she will still need to obtain blood work as it gets closer to ablation date. Mailed CT & ablation instructions to home address. Pt agreeable to plan.

## 2022-09-29 NOTE — Telephone Encounter (Signed)
Pt returning nurse's call. Please advise

## 2022-10-05 ENCOUNTER — Other Ambulatory Visit: Payer: Self-pay | Admitting: Cardiology

## 2022-10-05 DIAGNOSIS — I5042 Chronic combined systolic (congestive) and diastolic (congestive) heart failure: Secondary | ICD-10-CM

## 2022-10-05 DIAGNOSIS — I480 Paroxysmal atrial fibrillation: Secondary | ICD-10-CM

## 2022-10-10 ENCOUNTER — Other Ambulatory Visit: Payer: Medicaid Other

## 2022-10-20 ENCOUNTER — Ambulatory Visit: Payer: Medicaid Other | Admitting: Cardiology

## 2022-10-26 ENCOUNTER — Other Ambulatory Visit: Payer: Medicaid Other

## 2022-10-28 ENCOUNTER — Ambulatory Visit: Payer: Medicaid Other | Admitting: Cardiology

## 2022-11-09 ENCOUNTER — Ambulatory Visit: Payer: Medicaid Other | Admitting: Cardiology

## 2022-11-11 ENCOUNTER — Telehealth: Payer: Self-pay | Admitting: Cardiology

## 2022-11-11 NOTE — Telephone Encounter (Signed)
Called pt back - No answer. LM telling her she will need to go to [facility] in [place] the week before her CT Scan that is scheduled on 9/24.

## 2022-11-11 NOTE — Telephone Encounter (Signed)
Patient wants call back to clarify when she needs to get her lab work done prior to her procedure.

## 2022-11-30 ENCOUNTER — Ambulatory Visit: Payer: Medicaid Other

## 2022-11-30 DIAGNOSIS — I5042 Chronic combined systolic (congestive) and diastolic (congestive) heart failure: Secondary | ICD-10-CM

## 2022-11-30 DIAGNOSIS — I480 Paroxysmal atrial fibrillation: Secondary | ICD-10-CM

## 2022-12-15 ENCOUNTER — Telehealth: Payer: Self-pay | Admitting: Cardiology

## 2022-12-15 ENCOUNTER — Other Ambulatory Visit: Payer: Self-pay | Admitting: Cardiology

## 2022-12-15 NOTE — Telephone Encounter (Signed)
Pt scheduled to see MD on Monday (ok per Camnitz) for labs and H&P. Pt agreeable to plan.

## 2022-12-15 NOTE — Telephone Encounter (Signed)
Patient is requesting to speak with RN Sherri. Please advise

## 2022-12-15 NOTE — Telephone Encounter (Signed)
Aware still waiting to speak w/ MD.

## 2022-12-19 ENCOUNTER — Encounter: Payer: Self-pay | Admitting: Cardiology

## 2022-12-19 ENCOUNTER — Ambulatory Visit: Payer: Medicaid Other | Attending: Cardiology | Admitting: Cardiology

## 2022-12-19 VITALS — BP 108/60 | HR 61 | Ht 64.0 in | Wt 170.2 lb

## 2022-12-19 DIAGNOSIS — D6869 Other thrombophilia: Secondary | ICD-10-CM

## 2022-12-19 DIAGNOSIS — I4819 Other persistent atrial fibrillation: Secondary | ICD-10-CM

## 2022-12-19 DIAGNOSIS — I2510 Atherosclerotic heart disease of native coronary artery without angina pectoris: Secondary | ICD-10-CM

## 2022-12-19 NOTE — H&P (View-Only) (Signed)
Electrophysiology Office Note:   Date:  12/19/2022  ID:  Alexandra Ward, DOB 1960/11/10, MRN [id number]  Primary Cardiologist: Alexandra Negus, MD Electrophysiologist: Alexandra Lemming, MD      History of Present Illness:   Alexandra Ward is a 62 y.o. female with h/o hyperlipidemia, diabetes, CVA, coronary artery disease post PCI to the LAD post STEMI, atrial fibrillation seen today for routine electrophysiology followup.   Since last being seen in our clinic the patient reports doing well.  She remains on amiodarone.  She continues to have minimal episodes of atrial fibrillation on amiodarone.  Despite this, due to long-term side effects of amiodarone, she would like to be left off her medications.  she denies chest pain, palpitations, dyspnea, PND, orthopnea, nausea, vomiting, dizziness, syncope, edema, weight gain, or early satiety.   Review of systems complete and found to be negative unless listed in HPI.   EP Information / Studies Reviewed:    EKG is ordered today. Personal review as below.  EKG Interpretation Date/Time:  Monday December 19 2022 09:55:54 EDT Ventricular Rate:  61 PR Interval:  160 QRS Duration:  90 QT Interval:  442 QTC Calculation: 444 R Axis:   93  Text Interpretation: Normal sinus rhythm Rightward axis Septal infarct (cited on or before 15-Sep-2021) When compared with ECG of 15-Sep-2021 01:34, Vent. rate has decreased BY  33 BPM Confirmed by Alexandra Ward ([id number]) on 12/19/2022 10:04:50 AM     Risk Assessment/Calculations:    CHA2DS2-VASc Score = 6   This indicates a 9.7% annual risk of stroke. The patient's score is based upon: CHF History: 0 HTN History: 1 Diabetes History: 1 Stroke History: 2 Vascular Disease History: 1 Age Score: 0 Gender Score: 1             Physical Exam:   VS:  BP 108/60 (BP Location: Left Arm, Patient Position: Sitting, Cuff Size: Large)   Pulse 61   Ht 5\' 4"  (1.626 m)   Wt 170 lb 3.2 oz (77.2 kg)   SpO2  99%   BMI 29.21 kg/m    Wt Readings from Last 3 Encounters:  12/19/22 170 lb 3.2 oz (77.2 kg)  07/11/22 187 lb 6.4 oz (85 kg)  04/14/22 177 lb (80.3 kg)     GEN: Well nourished, well developed in no acute distress NECK: No JVD; No carotid bruits CARDIAC: Regular rate and rhythm, no murmurs, rubs, gallops RESPIRATORY:  Clear to auscultation without rales, wheezing or rhonchi  ABDOMEN: Soft, non-tender, non-distended EXTREMITIES:  No edema; No deformity   ASSESSMENT AND PLAN:    1.  Persistent atrial fibrillation: Currently on amiodarone and metoprolol.  She has been having thyroid issues.  She would prefer to be off of amiodarone.  Alexandra Ward plan for ablation.  Risk and benefits have been discussed.  She understands the risks and is agreed to the procedure.  Risk, benefits, and alternatives to EP study and radiofrequency/pulse field ablation for afib were also discussed in detail today. These risks include but are not limited to stroke, bleeding, vascular damage, tamponade, perforation, damage to the esophagus, lungs, and other structures, pulmonary vein stenosis, worsening renal function, and death. The patient understands these risk and wishes to proceed.  We Alexandra Ward therefore proceed with catheter ablation at the next available time.  Carto, ICE, anesthesia are requested for the procedure.  Alexandra Ward also obtain CT PV protocol prior to the procedure to exclude LAA thrombus and further evaluate atrial anatomy.  2.  Secondary hypercoagulable state: Currently on Eliquis for atrial fibrillation  3.  Coronary artery disease: No current chest pain.  Plan per primary cardiology.  4.  Chronic systolic heart failure: Due to ischemic cardiomyopathy.  Currently off to medical therapy.  Ejection fraction is normalized.  Follow up with Dr. Elberta Ward as usual post procedure  Signed, Alexandra Nay Jorja Loa, MD

## 2022-12-19 NOTE — Progress Notes (Signed)
Electrophysiology Office Note:   Date:  12/19/2022  ID:  Alexandra Ward, DOB 1960/11/10, MRN [id number]  Primary Cardiologist: Alexandra Negus, MD Electrophysiologist: Alexandra Lemming, MD      History of Present Illness:   Alexandra Ward is a 62 y.o. female with h/o hyperlipidemia, diabetes, CVA, coronary artery disease post PCI to the LAD post STEMI, atrial fibrillation seen today for routine electrophysiology followup.   Since last being seen in our clinic the patient reports doing well.  She remains on amiodarone.  She continues to have minimal episodes of atrial fibrillation on amiodarone.  Despite this, due to long-term side effects of amiodarone, she would like to be left off her medications.  she denies chest pain, palpitations, dyspnea, PND, orthopnea, nausea, vomiting, dizziness, syncope, edema, weight gain, or early satiety.   Review of systems complete and found to be negative unless listed in HPI.   EP Information / Studies Reviewed:    EKG is ordered today. Personal review as below.  EKG Interpretation Date/Time:  Monday December 19 2022 09:55:54 EDT Ventricular Rate:  61 PR Interval:  160 QRS Duration:  90 QT Interval:  442 QTC Calculation: 444 R Axis:   93  Text Interpretation: Normal sinus rhythm Rightward axis Septal infarct (cited on or before 15-Sep-2021) When compared with ECG of 15-Sep-2021 01:34, Vent. rate has decreased BY  33 BPM Confirmed by Alexandra Ward ([id number]) on 12/19/2022 10:04:50 AM     Risk Assessment/Calculations:    CHA2DS2-VASc Score = 6   This indicates a 9.7% annual risk of stroke. The patient's score is based upon: CHF History: 0 HTN History: 1 Diabetes History: 1 Stroke History: 2 Vascular Disease History: 1 Age Score: 0 Gender Score: 1             Physical Exam:   VS:  BP 108/60 (BP Location: Left Arm, Patient Position: Sitting, Cuff Size: Large)   Pulse 61   Ht 5\' 4"  (1.626 m)   Wt 170 lb 3.2 oz (77.2 kg)   SpO2  99%   BMI 29.21 kg/m    Wt Readings from Last 3 Encounters:  12/19/22 170 lb 3.2 oz (77.2 kg)  07/11/22 187 lb 6.4 oz (85 kg)  04/14/22 177 lb (80.3 kg)     GEN: Well nourished, well developed in no acute distress NECK: No JVD; No carotid bruits CARDIAC: Regular rate and rhythm, no murmurs, rubs, gallops RESPIRATORY:  Clear to auscultation without rales, wheezing or rhonchi  ABDOMEN: Soft, non-tender, non-distended EXTREMITIES:  No edema; No deformity   ASSESSMENT AND PLAN:    1.  Persistent atrial fibrillation: Currently on amiodarone and metoprolol.  She has been having thyroid issues.  She would prefer to be off of amiodarone.  Alexandra Ward plan for ablation.  Risk and benefits have been discussed.  She understands the risks and is agreed to the procedure.  Risk, benefits, and alternatives to EP study and radiofrequency/pulse field ablation for afib were also discussed in detail today. These risks include but are not limited to stroke, bleeding, vascular damage, tamponade, perforation, damage to the esophagus, lungs, and other structures, pulmonary vein stenosis, worsening renal function, and death. The patient understands these risk and wishes to proceed.  We Alexandra Ward therefore proceed with catheter ablation at the next available time.  Carto, ICE, anesthesia are requested for the procedure.  Alexandra Ward also obtain CT PV protocol prior to the procedure to exclude LAA thrombus and further evaluate atrial anatomy.  2.  Secondary hypercoagulable state: Currently on Eliquis for atrial fibrillation  3.  Coronary artery disease: No current chest pain.  Plan per primary cardiology.  4.  Chronic systolic heart failure: Due to ischemic cardiomyopathy.  Currently off to medical therapy.  Ejection fraction is normalized.  Follow up with Alexandra Ward as usual post procedure  Signed, Alexandra Nay Jorja Loa, MD

## 2022-12-19 NOTE — Patient Instructions (Addendum)
See instruction letters given to you today.  Labs today:  BMET & CBC

## 2022-12-21 NOTE — Telephone Encounter (Signed)
This encounter was created in error - please disregard.

## 2022-12-23 ENCOUNTER — Telehealth: Payer: Self-pay | Admitting: Cardiology

## 2022-12-23 NOTE — Telephone Encounter (Signed)
Pt calling to report that she recently had an echocardiogram w/ Dr. Radene Knee office. He EF has improved and is normal now. She called because she was not sure if we could see that. Pt aware this is great news and I would forward to MD for his FYI. Aware that this would not change anything w/ ablation and to proceed w/ ablation plan. Patient verbalized understanding and agreeable to plan.

## 2022-12-23 NOTE — Telephone Encounter (Signed)
  Patient states she has some questions regarding her upcoming procedure. Please call

## 2022-12-25 NOTE — Pre-Procedure Instructions (Signed)
Attempted to call patient regarding procedure instructions.  Patient is scheduled for procedure with anesthesia.  Anesthesia requires you to hold ozempic.  Left voicemail to not take Ozempic after Monday 9/23 until after procedure.

## 2022-12-27 ENCOUNTER — Ambulatory Visit (HOSPITAL_COMMUNITY)
Admission: RE | Admit: 2022-12-27 | Discharge: 2022-12-27 | Disposition: A | Discharge status: Home / Self Care | Payer: Medicaid Other | Source: Ambulatory Visit | Attending: Cardiology | Admitting: Cardiology

## 2022-12-27 DIAGNOSIS — I480 Paroxysmal atrial fibrillation: Secondary | ICD-10-CM | POA: Insufficient documentation

## 2022-12-27 MED ORDER — IOHEXOL 350 MG/ML SOLN
95.0000 mL | Freq: Once | INTRAVENOUS | Status: AC | PRN
  Administered 2022-12-27: 95 mL via INTRAVENOUS

## 2022-12-28 ENCOUNTER — Ambulatory Visit: Payer: Medicaid Other | Admitting: Cardiology

## 2022-12-30 ENCOUNTER — Telehealth: Payer: Self-pay | Admitting: Cardiology

## 2022-12-30 NOTE — Telephone Encounter (Signed)
CT CARDIAC MORPH/PULM VEIN W/CM&W/O CA SCORE: Result Notes   Will Jorja Loa, MD 12/28/2022 10:51 AM EDT     CT without LAA thrombus.    The patient has been notified of the result and verbalized understanding.  All questions (if any) were answered. Loa Socks, LPN 1/61/0960 4:54 PM

## 2022-12-30 NOTE — Telephone Encounter (Signed)
Pt calling for ct results

## 2023-01-02 NOTE — Pre-Procedure Instructions (Signed)
Instructed patient on the following items: Arrival time 0800 Nothing to eat or drink after midnight No meds AM of procedure Responsible person to drive you home and stay with you for 24 hrs  Have you missed any doses of anti-coagulant Eliquis-takes twice a day, hasn't missed any doses.  Don't take dose in the morning.

## 2023-01-03 ENCOUNTER — Other Ambulatory Visit: Payer: Self-pay

## 2023-01-03 ENCOUNTER — Encounter (HOSPITAL_COMMUNITY)
Admission: RE | Disposition: A | Discharge status: Home / Self Care | Payer: Self-pay | Source: Home / Self Care | Attending: Cardiology

## 2023-01-03 ENCOUNTER — Ambulatory Visit (HOSPITAL_COMMUNITY): Payer: Medicaid Other

## 2023-01-03 ENCOUNTER — Ambulatory Visit (HOSPITAL_COMMUNITY)
Admission: RE | Admit: 2023-01-03 | Discharge: 2023-01-03 | Disposition: A | Discharge status: Home / Self Care | Payer: Medicaid Other | Attending: Cardiology | Admitting: Cardiology

## 2023-01-03 ENCOUNTER — Ambulatory Visit (HOSPITAL_BASED_OUTPATIENT_CLINIC_OR_DEPARTMENT_OTHER): Payer: Medicaid Other

## 2023-01-03 DIAGNOSIS — I5022 Chronic systolic (congestive) heart failure: Secondary | ICD-10-CM | POA: Diagnosis not present

## 2023-01-03 DIAGNOSIS — I2510 Atherosclerotic heart disease of native coronary artery without angina pectoris: Secondary | ICD-10-CM | POA: Insufficient documentation

## 2023-01-03 DIAGNOSIS — Z8673 Personal history of transient ischemic attack (TIA), and cerebral infarction without residual deficits: Secondary | ICD-10-CM | POA: Insufficient documentation

## 2023-01-03 DIAGNOSIS — I255 Ischemic cardiomyopathy: Secondary | ICD-10-CM | POA: Insufficient documentation

## 2023-01-03 DIAGNOSIS — D6869 Other thrombophilia: Secondary | ICD-10-CM | POA: Diagnosis not present

## 2023-01-03 DIAGNOSIS — F17210 Nicotine dependence, cigarettes, uncomplicated: Secondary | ICD-10-CM | POA: Diagnosis not present

## 2023-01-03 DIAGNOSIS — Z955 Presence of coronary angioplasty implant and graft: Secondary | ICD-10-CM | POA: Diagnosis not present

## 2023-01-03 DIAGNOSIS — I252 Old myocardial infarction: Secondary | ICD-10-CM | POA: Diagnosis not present

## 2023-01-03 DIAGNOSIS — I110 Hypertensive heart disease with heart failure: Secondary | ICD-10-CM | POA: Insufficient documentation

## 2023-01-03 DIAGNOSIS — E119 Type 2 diabetes mellitus without complications: Secondary | ICD-10-CM | POA: Diagnosis not present

## 2023-01-03 DIAGNOSIS — I4819 Other persistent atrial fibrillation: Secondary | ICD-10-CM | POA: Diagnosis present

## 2023-01-03 DIAGNOSIS — Z7901 Long term (current) use of anticoagulants: Secondary | ICD-10-CM | POA: Diagnosis not present

## 2023-01-03 DIAGNOSIS — E785 Hyperlipidemia, unspecified: Secondary | ICD-10-CM | POA: Insufficient documentation

## 2023-01-03 DIAGNOSIS — Z79899 Other long term (current) drug therapy: Secondary | ICD-10-CM | POA: Insufficient documentation

## 2023-01-03 DIAGNOSIS — I4891 Unspecified atrial fibrillation: Secondary | ICD-10-CM | POA: Diagnosis not present

## 2023-01-03 HISTORY — PX: ATRIAL FIBRILLATION ABLATION: EP1191

## 2023-01-03 LAB — GLUCOSE, CAPILLARY: Glucose-Capillary: 83 mg/dL (ref 70–99)

## 2023-01-03 LAB — POCT ACTIVATED CLOTTING TIME
Activated Clotting Time: 348 s
Activated Clotting Time: 397 s

## 2023-01-03 SURGERY — ATRIAL FIBRILLATION ABLATION
Anesthesia: General

## 2023-01-03 MED ORDER — SODIUM CHLORIDE 0.9% FLUSH: 3.0000 mL | INTRAVENOUS | Status: DC | PRN

## 2023-01-03 MED ORDER — PHENYLEPHRINE 80 MCG/ML (10ML) SYRINGE FOR IV PUSH (FOR BLOOD PRESSURE SUPPORT)
PREFILLED_SYRINGE | INTRAVENOUS | Status: DC | PRN
  Administered 2023-01-03: 40 ug via INTRAVENOUS

## 2023-01-03 MED ORDER — PROPOFOL 500 MG/50ML IV EMUL
INTRAVENOUS | Status: DC | PRN
  Administered 2023-01-03: 40 ug/kg/min via INTRAVENOUS

## 2023-01-03 MED ORDER — LIDOCAINE 2% (20 MG/ML) 5 ML SYRINGE
INTRAMUSCULAR | Status: DC | PRN
  Administered 2023-01-03: 100 mg via INTRAVENOUS

## 2023-01-03 MED ORDER — SODIUM CHLORIDE 0.9 % IV SOLN: INTRAVENOUS | Status: DC

## 2023-01-03 MED ORDER — SUGAMMADEX SODIUM 200 MG/2ML IV SOLN
INTRAVENOUS | Status: DC | PRN
  Administered 2023-01-03: 200 mg via INTRAVENOUS

## 2023-01-03 MED ORDER — ONDANSETRON HCL 4 MG/2ML IJ SOLN: 4.0000 mg | Freq: Four times a day (QID) | INTRAMUSCULAR | Status: DC | PRN

## 2023-01-03 MED ORDER — SODIUM CHLORIDE 0.9% FLUSH: 3.0000 mL | Freq: Two times a day (BID) | INTRAVENOUS | Status: DC

## 2023-01-03 MED ORDER — FENTANYL CITRATE (PF) 250 MCG/5ML IJ SOLN
INTRAMUSCULAR | Status: DC | PRN
  Administered 2023-01-03 (×2): 50 ug via INTRAVENOUS

## 2023-01-03 MED ORDER — ROCURONIUM BROMIDE 10 MG/ML (PF) SYRINGE
PREFILLED_SYRINGE | INTRAVENOUS | Status: DC | PRN
  Administered 2023-01-03: 30 mg via INTRAVENOUS
  Administered 2023-01-03: 50 mg via INTRAVENOUS

## 2023-01-03 MED ORDER — ONDANSETRON HCL 4 MG/2ML IJ SOLN
INTRAMUSCULAR | Status: DC | PRN
  Administered 2023-01-03: 4 mg via INTRAVENOUS

## 2023-01-03 MED ORDER — PHENYLEPHRINE HCL-NACL 20-0.9 MG/250ML-% IV SOLN
INTRAVENOUS | Status: DC | PRN
  Administered 2023-01-03: 20 ug/min via INTRAVENOUS

## 2023-01-03 MED ORDER — ATROPINE SULFATE 0.4 MG/ML IV SOLN
INTRAVENOUS | Status: DC | PRN
  Administered 2023-01-03: 1 mg via INTRAVENOUS

## 2023-01-03 MED ORDER — HEPARIN SODIUM (PORCINE) 1000 UNIT/ML IJ SOLN
INTRAMUSCULAR | Status: DC | PRN
  Administered 2023-01-03: 14000 [IU] via INTRAVENOUS

## 2023-01-03 MED ORDER — PROPOFOL 10 MG/ML IV BOLUS
INTRAVENOUS | Status: DC | PRN
  Administered 2023-01-03: 50 mg via INTRAVENOUS

## 2023-01-03 MED ORDER — HEPARIN (PORCINE) IN NACL 1000-0.9 UT/500ML-% IV SOLN
INTRAVENOUS | Status: DC | PRN
  Administered 2023-01-03 (×4): 500 mL

## 2023-01-03 MED ORDER — SODIUM CHLORIDE 0.9 % IV SOLN: 250.0000 mL | INTRAVENOUS | Status: DC | PRN

## 2023-01-03 MED ORDER — MIDAZOLAM HCL 2 MG/2ML IJ SOLN
INTRAMUSCULAR | Status: DC | PRN
  Administered 2023-01-03: 2 mg via INTRAVENOUS

## 2023-01-03 SURGICAL SUPPLY — 21 items
BAG SNAP BAND KOVER 36X36 (MISCELLANEOUS) ×2
BLANKET WARM UNDERBOD FULL ACC (MISCELLANEOUS) ×1
CABLE PFA RX CATH CONN (CABLE) ×1
CATH FARAWAVE ABLATION 31 (CATHETERS) ×1
CATH OCTARAY 2.0 F 3-3-3-3-3 (CATHETERS) ×1
CATH SOUNDSTAR ECO 8FR (CATHETERS) ×1
CATH WEBSTER BI DIR CS D-F CRV (CATHETERS) ×1
CLOSURE MYNX CONTROL 6F/7F (Vascular Products) ×2 IMPLANT
CLOSURE PERCLOSE PROSTYLE (VASCULAR PRODUCTS) ×2
COVER SWIFTLINK CONNECTOR (BAG) ×1
DILATOR VESSEL 38 20CM 16FR (INTRODUCER) ×1
INQWIRE 1.5J .035X260CM (WIRE) ×1
PACK EP LATEX FREE (CUSTOM PROCEDURE TRAY) ×1
PACK EP LF (CUSTOM PROCEDURE TRAY) ×1
PAD DEFIB RADIO PHYSIO CONN (PAD) ×1
PATCH CARTO3 (PAD) ×1
SHEATH FARADRIVE STEERABLE (SHEATH) ×1
SHEATH PINNACLE 8F 10CM (SHEATH) ×2
SHEATH PINNACLE 9F 10CM (SHEATH) ×1
SHEATH PROBE COVER 6X72 (BAG) ×2
SHEATH WIRE KIT BAYLIS SL1 (KITS) ×1

## 2023-01-03 NOTE — Transfer of Care (Signed)
Immediate Anesthesia Transfer of Care Note  Patient: Alexandra Ward  Procedure(s) Performed: ATRIAL FIBRILLATION ABLATION  Patient Location: PACU and Cath Lab  Anesthesia Type:General  Level of Consciousness: awake  Airway & Oxygen Therapy: Patient Spontanous Breathing  Post-op Assessment: Report given to RN  Post vital signs: Reviewed and stable  Last Vitals:  Vitals Value Taken Time  BP    Temp    Pulse    Resp    SpO2      Last Pain:  Vitals:   01/03/23 0911  TempSrc:   PainSc: 0-No pain         Complications: No notable events documented.

## 2023-01-03 NOTE — Progress Notes (Signed)
PT removing bp cuff, pt wants to get up out of bed to eat. Pt argumentative despite instructing why she can't get up, why we need to monitor her vitals and why we can't take her iv out. Will continue to monitor,

## 2023-01-03 NOTE — Anesthesia Postprocedure Evaluation (Signed)
Anesthesia Post Note  Patient: Alexandra Ward  Procedure(s) Performed: ATRIAL FIBRILLATION ABLATION     Patient location during evaluation: PACU Anesthesia Type: General Level of consciousness: awake and alert Pain management: pain level controlled Vital Signs Assessment: post-procedure vital signs reviewed and stable Respiratory status: spontaneous breathing, nonlabored ventilation, respiratory function stable and patient connected to nasal cannula oxygen Cardiovascular status: blood pressure returned to baseline and stable Postop Assessment: no apparent nausea or vomiting Anesthetic complications: no  No notable events documented.  Last Vitals:  Vitals:   01/03/23 1253 01/03/23 1319  BP:  105/77  Pulse:  71  Resp:  13  Temp:    SpO2: 94% (!) 89%    Last Pain:  Vitals:   01/03/23 1257  TempSrc:   PainSc: 0-No pain                 [name] S

## 2023-01-03 NOTE — Anesthesia Preprocedure Evaluation (Addendum)
Anesthesia Evaluation  Patient identified by MRN, date of birth, ID band Patient awake    Reviewed: Allergy & Precautions, H&P , NPO status , Patient's Chart, lab work & pertinent test results  Airway Mallampati: II  TM Distance: >3 FB Neck ROM: Full    Dental  (+) Upper Dentures   Pulmonary Current Smoker   Pulmonary exam normal breath sounds clear to auscultation       Cardiovascular hypertension, + CAD, + Past MI and +CHF  Normal cardiovascular exam+ dysrhythmias Atrial Fibrillation  Rhythm:Irregular Rate:Normal  Echocardiogram 11/30/2022:  Left ventricle cavity is normal in size. Normal left ventricular wall  thickness. Apical anterior, apical lateral, apical inferior, apical septal  and apical cap akinesis. Normal LV systolic function with EF 55%. Doppler  evidence of grade I (impaired) diastolic dysfunction, normal LAP.  Left atrial cavity is mildly dilated.  Compared to previous study on 03/10/2021, LVEF has improved from 30-35%.     Neuro/Psych negative neurological ROS  negative psych ROS   GI/Hepatic negative GI ROS, Neg liver ROS,,,  Endo/Other  diabetes, Type 2    Renal/GU negative Renal ROS  negative genitourinary   Musculoskeletal negative musculoskeletal ROS (+)    Abdominal   Peds negative pediatric ROS (+)  Hematology negative hematology ROS (+)   Anesthesia Other Findings   Reproductive/Obstetrics negative OB ROS                             Anesthesia Physical Anesthesia Plan  ASA: 3  Anesthesia Plan: General   Post-op Pain Management: Minimal or no pain anticipated   Induction: Intravenous  PONV Risk Score and Plan: 2 and Ondansetron, Dexamethasone, TIVA and Treatment may vary due to age or medical condition  Airway Management Planned: Oral ETT  Additional Equipment:   Intra-op Plan:   Post-operative Plan: Extubation in OR  Informed Consent: I have  reviewed the patients History and Physical, chart, labs and discussed the procedure including the risks, benefits and alternatives for the proposed anesthesia with the patient or authorized representative who has indicated his/her understanding and acceptance.     Dental advisory given  Plan Discussed with: CRNA and Surgeon  Anesthesia Plan Comments:        Anesthesia Quick Evaluation

## 2023-01-03 NOTE — Anesthesia Procedure Notes (Signed)
Procedure Name: Intubation Date/Time: 01/03/2023 10:13 AM  Performed by: Vena Austria, CRNAPre-anesthesia Checklist: Patient identified, Emergency Drugs available, Suction available, Patient being monitored and Timeout performed Patient Re-evaluated:Patient Re-evaluated prior to induction Oxygen Delivery Method: Circle system utilized Induction Type: IV induction Ventilation: Mask ventilation without difficulty Laryngoscope Size: Mac and 3 Grade View: Grade I Tube type: Oral Number of attempts: 2 Airway Equipment and Method: Stylet Placement Confirmation: ETT inserted through vocal cords under direct vision, positive ETCO2, CO2 detector and breath sounds checked- equal and bilateral Secured at: 23 cm Tube secured with: Tape Comments: Looked glido 3 but better intubating angle with mac 3 though both had grade 1 views

## 2023-01-03 NOTE — Interval H&P Note (Signed)
History and Physical Interval Note:  01/03/2023 9:06 AM  Alexandra Ward  has presented today for surgery, with the diagnosis of afib.  The various methods of treatment have been discussed with the patient and family. After consideration of risks, benefits and other options for treatment, the patient has consented to  Procedure(s): ATRIAL FIBRILLATION ABLATION (N/A) as a surgical intervention.  The patient's history has been reviewed, patient examined, no change in status, stable for surgery.  I have reviewed the patient's chart and labs.  Questions were answered to the patient's satisfaction.     Zali Kamaka [employer]

## 2023-01-03 NOTE — Discharge Instructions (Signed)

## 2023-01-04 ENCOUNTER — Encounter (HOSPITAL_COMMUNITY): Payer: Self-pay | Admitting: Cardiology

## 2023-01-10 ENCOUNTER — Telehealth: Payer: Self-pay | Admitting: Cardiology

## 2023-01-10 ENCOUNTER — Other Ambulatory Visit: Payer: Self-pay | Admitting: Cardiology

## 2023-01-10 MED ORDER — DAPAGLIFLOZIN PROPANEDIOL 10 MG PO TABS: 10.0000 mg | ORAL_TABLET | Freq: Every day | ORAL | 1 refills | Status: DC

## 2023-01-10 NOTE — Telephone Encounter (Signed)
*  STAT* If patient is at the pharmacy, call can be transferred to refill team.   1. Which medications need to be refilled? (please list name of each medication and dose if known)   dapagliflozin propanediol (FARXIGA) 10 MG TABS tablet   2. Would you like to learn more about the convenience, safety, & potential cost savings by using the [facility]?   3. Are you open to using the [facility] (Type [facility]. ).  4. Which pharmacy/location (including street and city if local pharmacy) is medication to be sent to?  [facility] - [place], [place] - [address]   5. Do they need a 30 day or 90 day supply? 90 days   Patient stated she still has some medication left.  Patient has appointment scheduled on 10/31.

## 2023-01-10 NOTE — Telephone Encounter (Signed)
Pt's medication was sent to pt's pharmacy as requested. Confirmation received.  °

## 2023-01-11 ENCOUNTER — Ambulatory Visit: Payer: Medicaid Other | Admitting: Cardiology

## 2023-01-13 ENCOUNTER — Telehealth: Payer: Self-pay | Admitting: Cardiology

## 2023-01-13 NOTE — Telephone Encounter (Signed)
New message:    Patient said she would like for Sherri to please give her a call today. She says she want to talk to her about her Ablation that she had 01-03-23.

## 2023-01-13 NOTE — Telephone Encounter (Signed)
Was asking about her getting her flu shot after procedure. Aware ok to get her vaccination. Patient verbalized understanding and agreeable to plan.

## 2023-01-31 ENCOUNTER — Ambulatory Visit (HOSPITAL_COMMUNITY): Payer: Medicaid Other | Admitting: Internal Medicine

## 2023-02-02 ENCOUNTER — Ambulatory Visit: Payer: Medicaid Other | Admitting: Cardiology

## 2023-02-02 ENCOUNTER — Telehealth: Payer: Self-pay | Admitting: Cardiology

## 2023-02-02 NOTE — Telephone Encounter (Signed)
Patient is requesting to switch from Dr. Rosemary Holms to Dr. Jenene Slicker because she lives in [place] and the location is more convenient.   Please advise.

## 2023-02-06 ENCOUNTER — Ambulatory Visit (HOSPITAL_COMMUNITY): Payer: Medicaid Other | Admitting: Internal Medicine

## 2023-02-14 ENCOUNTER — Ambulatory Visit (HOSPITAL_COMMUNITY)
Admission: RE | Admit: 2023-02-14 | Discharge: 2023-02-14 | Disposition: A | Discharge status: Home / Self Care | Payer: Medicaid Other | Source: Ambulatory Visit | Attending: Internal Medicine | Admitting: Internal Medicine

## 2023-02-14 VITALS — BP 76/50 | HR 69 | Ht 64.0 in | Wt 162.6 lb

## 2023-02-14 DIAGNOSIS — I480 Paroxysmal atrial fibrillation: Secondary | ICD-10-CM | POA: Diagnosis not present

## 2023-02-14 DIAGNOSIS — I4819 Other persistent atrial fibrillation: Secondary | ICD-10-CM | POA: Diagnosis present

## 2023-02-14 DIAGNOSIS — Z79899 Other long term (current) drug therapy: Secondary | ICD-10-CM

## 2023-02-14 DIAGNOSIS — Z7901 Long term (current) use of anticoagulants: Secondary | ICD-10-CM | POA: Insufficient documentation

## 2023-02-14 DIAGNOSIS — I959 Hypotension, unspecified: Secondary | ICD-10-CM | POA: Insufficient documentation

## 2023-02-14 DIAGNOSIS — D6869 Other thrombophilia: Secondary | ICD-10-CM | POA: Diagnosis not present

## 2023-02-14 DIAGNOSIS — Z5181 Encounter for therapeutic drug level monitoring: Secondary | ICD-10-CM | POA: Diagnosis not present

## 2023-02-14 NOTE — Progress Notes (Signed)
Primary Care Physician: Kirstie Peri, MD Primary Cardiologist: Elder Negus, MD Electrophysiologist: Will Jorja Loa, MD     Referring Physician: Dr. [name] is a 62 y.o. female with a history of HLD, T2DM, CVA, CAD s/p PCI to the LAD post STEMI, and persistent atrial fibrillation who presents for consultation in the [facility]. S/p Afib ablation on 01/03/23 by Dr. Elberta Fortis. Currently taking amiodarone 100 mg dailiy. Patient is on Eliquis 5 mg BID for a CHADS2VASC score of 6.  On evaluation today, she is currently in NSR. No episodes of Afib since ablation. No chest pain, SOB, leg swelling. She notes marked hypotension today and notes it ongoing for several days possibly. She denies acute illness. She took her morning dose of entresto but has not yet taken 1/2 aldactone pill. Leg sites healed without issue. No missed doses of anticoagulant.  Today, she denies symptoms of orthopnea, PND, dizziness, presyncope, syncope, snoring, daytime somnolence, bleeding, or neurologic sequela. The patient is tolerating medications without difficulties and is otherwise without complaint today.   she has a BMI of Body mass index is 27.91 kg/m.Marland Kitchen Filed Weights   02/14/23 1023  Weight: 73.8 kg    Current Outpatient Medications  Medication Sig Dispense Refill   ALPRAZolam (XANAX) 1 MG tablet Take 0.5-1 mg by mouth daily as needed for anxiety.     amiodarone (PACERONE) 200 MG tablet Take 100 mg by mouth daily.     apixaban (ELIQUIS) 5 MG TABS tablet Take 1 tablet (5 mg total) by mouth 2 (two) times daily. 60 tablet 3   atorvastatin (LIPITOR) 80 MG tablet TAKE 1 TABLET BY MOUTH DAILY AT 8pm 30 tablet 3   Brimonidine Tartrate (LUMIFY) 0.025 % SOLN Place 1 drop into both eyes daily as needed (irritation/dry eyes).     dapagliflozin propanediol (FARXIGA) 10 MG TABS tablet Take 1 tablet (10 mg total) by mouth daily. 90 tablet 1   metFORMIN  (GLUCOPHAGE) 500 MG tablet Take 500-1,000 mg by mouth See admin instructions. Taking 1000 mg in the morning and 500 mg at bedtime     OZEMPIC, 0.25 OR 0.5 MG/DOSE, 2 MG/3ML SOPN Inject 0.5 mg into the skin once a week.     sacubitril-valsartan (ENTRESTO) 24-26 MG Take 1 tablet by mouth 2 (two) times daily. 60 tablet 3   spironolactone (ALDACTONE) 25 MG tablet TAKE 1/2 TABLET BY MOUTH EVERY DAY 45 tablet 3   No current facility-administered medications for this encounter.    Atrial Fibrillation Management history:  Previous antiarrhythmic drugs: amiodarone Previous cardioversions: none Previous ablations: 01/03/23 Anticoagulation history: Eliquis   ROS- All systems are reviewed and negative except as per the HPI above.  Physical Exam: BP (!) 76/50   Pulse 69   Ht 5\' 4"  (1.626 m)   Wt 73.8 kg   BMI 27.91 kg/m   GEN: Well nourished, well developed in no acute distress. Flat affect. NECK: No JVD; No carotid bruits CARDIAC: Regular rate and rhythm, no murmurs, rubs, gallops RESPIRATORY:  Clear to auscultation without rales, wheezing or rhonchi  ABDOMEN: Soft, non-tender, non-distended EXTREMITIES:  No edema; No deformity   EKG today demonstrates  Vent. rate 69 BPM PR interval 156 ms QRS duration 84 ms QT/QTcB 406/435 ms P-R-T axes 79 80 125 Normal sinus rhythm Low voltage QRS Septal infarct , age undetermined Abnormal ECG When compared with ECG of 03-Jan-2023 12:07, PREVIOUS ECG IS PRESENT  Echo 11/30/22  demonstrated  Echocardiogram 11/30/2022: Left ventricle cavity is normal in size. Normal left ventricular wall thickness. Apical anterior, apical lateral, apical inferior, apical septal and apical cap akinesis. Normal LV systolic function with EF 55%. Doppler evidence of grade I (impaired) diastolic dysfunction, normal LAP. Left atrial cavity is mildly dilated. Compared to previous study on 03/10/2021, LVEF has improved from 30-35%.    ASSESSMENT & PLAN CHA2DS2-VASc  Score = 6  The patient's score is based upon: CHF History: 0 HTN History: 1 Diabetes History: 1 Stroke History: 2 Vascular Disease History: 1 Age Score: 0 Gender Score: 1       ASSESSMENT AND PLAN: Persistent Atrial Fibrillation (ICD10:  I48.19) The patient's CHA2DS2-VASc score is 6, indicating a 9.7% annual risk of stroke.   S/p Afib ablation on 01/03/23 by Dr. Elberta Fortis.  She is currently in NSR.  Qtc stable. Continue amiodarone 100 mg daily.   Secondary Hypercoagulable State (ICD10:  D68.69) The patient is at significant risk for stroke/thromboembolism based upon her CHA2DS2-VASc Score of 6.  Continue Apixaban (Eliquis).  No missed doses.  Hypotension Discussion with Dr. Aggie Cosier as to plan for hypotension. Patient will hold aldactone and PM dose of Entresto today. She will take her BP later today and tomorrow morning and call their office on how to proceed with medications regarding hypotension. Review of prior office visits show lower blood pressure on average with systolic low 100s.     Follow up as scheduled with Dr. Elberta Fortis.    [place] Bells, PA-C  Afib Clinic [facility] [address] [place], [place] [address] [phone]

## 2023-02-14 NOTE — Patient Instructions (Addendum)
Hold entresto and spironolactone  - Take bp tonight and tomorrow call dr Rosemary Holms with readings and next steps (before taking again) [phone]

## 2023-02-15 ENCOUNTER — Telehealth: Payer: Self-pay | Admitting: Cardiology

## 2023-02-15 NOTE — Telephone Encounter (Signed)
Message Received: Today Alexandra Simpson, RN  Alexandra Socks, LPN Caller: Unspecified (Today,  9:46 AM)  Alexandra Mellow PA spoke with Dr. Rosemary Holms yesterday during the office visit with the instructions to call today with her BP reports to his office for guidance on appropriate medication adjustments.  Hypotension Discussion with Dr. Aggie Cosier as to plan for hypotension. Patient will hold aldactone and PM dose of Entresto today. She will take her BP later today and tomorrow morning and call their office on how to proceed with medications regarding hypotension. Review of prior office visits show lower blood pressure on average with systolic low 100s.  Thanks! Kennyth Arnold

## 2023-02-15 NOTE — Telephone Encounter (Signed)
Pt c/o medication issue:  1. Name of Medication:   sacubitril-valsartan (ENTRESTO) 24-26 MG   2. How are you currently taking this medication (dosage and times per day)?   As prescribed  3. Are you having a reaction (difficulty breathing--STAT)?   4. What is your medication issue?   Patient stated she was told to not take this medication last night and this morning.  Patient wants to know next steps.  BP 11/12 - 108/68  HR 75  9:00 pm       11/13 - 118/70  HR 68  6:00 am

## 2023-02-15 NOTE — Telephone Encounter (Signed)
Patient is following up requesting feedback regarding whether or not she needs to take [facility]. Please advise.

## 2023-02-15 NOTE — Telephone Encounter (Signed)
That would be ideal. It appears that her BP was low at Afib clinic, so I agreed with holding medications as above, as patient has not yet seen Dr. Jenene Slicker. She has appt with her on 12/5.  Thanks MJP

## 2023-02-15 NOTE — Telephone Encounter (Signed)
Pt switched from Dr. Rosemary Holms to Dr. Jenene Slicker for commute reasons (see telenote 10/31 indicating this).  Will route this call to Dr. Antoine Poche office for further management.

## 2023-02-15 NOTE — Telephone Encounter (Signed)
Routed this to our [place] office for their reference and management going forward.  Will let our afib clinic know as well.

## 2023-02-15 NOTE — Telephone Encounter (Signed)
Dr. Rosemary Holms, the pt switched to Dr. Jenene Slicker at our [facility] office, per her request.  She has an appt with Dr. Jenene Slicker on 12/5 for follow-up.  Do you want Dr. Antoine Poche office to manage this for the pt going forward, being she will be seeing them for her cardiac care?

## 2023-02-16 NOTE — Telephone Encounter (Signed)
Patient states she has stopped taking all entresto due to lightheadedness and dizziness she states all symptoms have resided since stopping the entresto

## 2023-02-20 ENCOUNTER — Other Ambulatory Visit: Payer: Self-pay | Admitting: Cardiology

## 2023-03-09 ENCOUNTER — Ambulatory Visit: Payer: Medicaid Other | Admitting: Internal Medicine

## 2023-03-10 ENCOUNTER — Encounter: Payer: Self-pay | Admitting: Internal Medicine

## 2023-03-10 ENCOUNTER — Ambulatory Visit: Payer: Medicaid Other | Attending: Internal Medicine | Admitting: Internal Medicine

## 2023-03-10 VITALS — BP 100/60 | HR 60 | Ht 64.0 in | Wt 163.8 lb

## 2023-03-10 DIAGNOSIS — I5032 Chronic diastolic (congestive) heart failure: Secondary | ICD-10-CM | POA: Diagnosis not present

## 2023-03-10 DIAGNOSIS — I2510 Atherosclerotic heart disease of native coronary artery without angina pectoris: Secondary | ICD-10-CM | POA: Diagnosis not present

## 2023-03-10 DIAGNOSIS — I480 Paroxysmal atrial fibrillation: Secondary | ICD-10-CM

## 2023-03-10 DIAGNOSIS — E7849 Other hyperlipidemia: Secondary | ICD-10-CM

## 2023-03-10 NOTE — Patient Instructions (Signed)

## 2023-03-10 NOTE — Progress Notes (Signed)
Cardiology Office Note  Date: 03/10/2023   ID: KIRSTIE QUIRAM, DOB 1960/07/16, MRN [id number]  PCP:  Kirstie Peri, MD  Cardiologist:  Marjo Bicker, MD Electrophysiologist:  Regan Lemming, MD   History of Present Illness: HERA DEXHEIMER is a 62 y.o. female  Overall doing great, no dizziness after stopping Entresto and spironolactone.  No angina, DOE, orthopnea, PND, dizziness, syncope and palpitations.  EKG from 11/24 showed normal sinus rhythm.    Past Medical History:  Diagnosis Date   Diabetes mellitus without complication (HCC)    Hyperlipidemia    Stroke [facility])    Vertebral artery dissection 2018    Past Surgical History:  Procedure Laterality Date   ATRIAL FIBRILLATION ABLATION N/A 01/03/2023   Procedure: ATRIAL FIBRILLATION ABLATION;  Surgeon: Regan Lemming, MD;  Location: [facility] INVASIVE CV LAB;  Service: Cardiovascular;  Laterality: N/A;   CORONARY THROMBECTOMY N/A 09/21/2020   Procedure: Coronary Thrombectomy;  Surgeon: Yates Decamp, MD;  Location: [facility] INVASIVE CV LAB;  Service: Cardiovascular;  Laterality: N/A;   CORONARY/GRAFT ACUTE MI REVASCULARIZATION N/A 09/21/2020   Procedure: Coronary/Graft Acute MI Revascularization;  Surgeon: Yates Decamp, MD;  Location: [facility] INVASIVE CV LAB;  Service: Cardiovascular;  Laterality: N/A;   CORONARY/GRAFT ACUTE MI REVASCULARIZATION N/A 09/15/2021   Procedure: Coronary/Graft Acute MI Revascularization;  Surgeon: Elder Negus, MD;  Location: [facility] INVASIVE CV LAB;  Service: Cardiovascular;  Laterality: N/A;   LEFT HEART CATH AND CORONARY ANGIOGRAPHY N/A 09/21/2020   Procedure: LEFT HEART CATH AND CORONARY ANGIOGRAPHY;  Surgeon: Yates Decamp, MD;  Location: [facility] INVASIVE CV LAB;  Service: Cardiovascular;  Laterality: N/A;    Current Outpatient Medications  Medication Sig Dispense Refill   ALPRAZolam (XANAX) 1 MG tablet Take 0.5-1 mg by mouth daily as needed for anxiety.     amiodarone (PACERONE) 200 MG tablet Take 100 mg  by mouth daily.     apixaban (ELIQUIS) 5 MG TABS tablet Take 1 tablet (5 mg total) by mouth 2 (two) times daily. 60 tablet 3   atorvastatin (LIPITOR) 80 MG tablet TAKE 1 TABLET BY MOUTH DAILY AT 8pm 30 tablet 3   Brimonidine Tartrate (LUMIFY) 0.025 % SOLN Place 1 drop into both eyes daily as needed (irritation/dry eyes).     dapagliflozin propanediol (FARXIGA) 10 MG TABS tablet Take 1 tablet (10 mg total) by mouth daily. 90 tablet 1   metFORMIN (GLUCOPHAGE) 500 MG tablet Take 500 mg by mouth daily with breakfast.     OZEMPIC, 0.25 OR 0.5 MG/DOSE, 2 MG/3ML SOPN Inject 0.5 mg into the skin once a week.     sacubitril-valsartan (ENTRESTO) 24-26 MG Take 1 tablet by mouth 2 (two) times daily. (Patient not taking: Reported on 03/10/2023) 60 tablet 3   spironolactone (ALDACTONE) 25 MG tablet TAKE 1/2 TABLET BY MOUTH EVERY DAY (Patient not taking: Reported on 03/10/2023) 45 tablet 3   No current facility-administered medications for this visit.   Allergies:  Hydrocodone, Other, Tyloxapol, Oxycodone-acetaminophen, and Prednisone   Social History: The patient  reports that she has been smoking cigarettes. She has a 7.5 pack-year smoking history. She has never used smokeless tobacco. She reports that she does not drink alcohol and does not use drugs.   Family History: The patient's family history includes Heart disease in her mother.   ROS:  Please see the history of present illness. Otherwise, complete review of systems is positive for none  All other systems are reviewed and negative.   Physical  Exam: VS:  BP 100/60   Pulse 60   Ht 5\' 4"  (1.626 m)   Wt 163 lb 12.8 oz (74.3 kg)   SpO2 94%   BMI 28.12 kg/m , BMI Body mass index is 28.12 kg/m.  Wt Readings from Last 3 Encounters:  03/10/23 163 lb 12.8 oz (74.3 kg)  02/14/23 162 lb 9.6 oz (73.8 kg)  01/03/23 166 lb (75.3 kg)    General: Patient appears comfortable at rest. HEENT: Conjunctiva and lids normal, oropharynx clear with moist  mucosa. Neck: Supple, no elevated JVP or carotid bruits, no thyromegaly. Lungs: Clear to auscultation, nonlabored breathing at rest. Cardiac: Regular rate and rhythm, no S3 or significant systolic murmur, no pericardial rub. Abdomen: Soft, nontender, no hepatomegaly, bowel sounds present, no guarding or rebound. Extremities: No pitting edema, distal pulses 2+. Skin: Warm and dry. Musculoskeletal: No kyphosis. Neuropsychiatric: Alert and oriented x3, affect grossly appropriate.  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  Currently LDL at goal.  Continue Jardiance 10 mg once daily. -Compensated, previously on Entresto and spironolactone which were discontinued due to dizziness and hypotension.  Doing well off these medications. -Underwent PVI in 10/24, NSR on EKG from 11/24, continues to follow-up with A-fib clinic and Dr. Elberta Fortis.  Currently on amiodarone 100 mg once daily and Eliquis 5 mg twice daily, no bleeding complications. -LOst around 20 pounds of weight with Ozempic, HbA1c controlled, will defer Ozempic management to PCP.  Patient prefers to be seen by Dr. Rosemary Holms. Will schedule follow-up visit in 6 months.     Medication Adjustments/Labs and Tests Ordered: Current medicines are reviewed at length with the patient today.  Concerns  regarding medicines are outlined above.    Disposition:  Follow up  6 months with Dr Rosemary Holms  Signed Janyth Riera Verne Spurr, MD, 03/10/2023 1:03 PM    [facility] [address] [place], [place], [place] [address]

## 2023-04-06 ENCOUNTER — Ambulatory Visit: Payer: Medicaid Other | Admitting: Student

## 2023-04-11 ENCOUNTER — Ambulatory Visit: Payer: Medicaid Other | Attending: Student | Admitting: Student

## 2023-04-11 ENCOUNTER — Encounter: Payer: Self-pay | Admitting: Student

## 2023-04-11 VITALS — BP 108/62 | HR 63 | Ht 64.0 in | Wt 164.6 lb

## 2023-04-11 DIAGNOSIS — D6869 Other thrombophilia: Secondary | ICD-10-CM

## 2023-04-11 DIAGNOSIS — I480 Paroxysmal atrial fibrillation: Secondary | ICD-10-CM | POA: Diagnosis not present

## 2023-04-11 DIAGNOSIS — I2510 Atherosclerotic heart disease of native coronary artery without angina pectoris: Secondary | ICD-10-CM | POA: Diagnosis not present

## 2023-04-11 NOTE — Progress Notes (Signed)
  Electrophysiology Office Note:   Date:  04/11/2023  ID:  Alexandra Ward, DOB 1961/03/24, MRN [id number]  Primary Cardiologist: Newman JINNY Lawrence, MD Electrophysiologist: Soyla Gladis Norton, MD      History of Present Illness:   Alexandra Ward is a 63 y.o. female with h/o HLD, T2DM, CVA, CAD s/p PCI to the LAD post STEMI, and persistent atrial fibrillation  seen today for routine electrophysiology followup.   Since last being seen in our clinic the patient reports doing well. No further AF. Still wants to come off amio. Overall, she denies chest pain, palpitations, dyspnea, PND, orthopnea, nausea, vomiting, dizziness, syncope, edema, weight gain, or early satiety.   Review of systems complete and found to be negative unless listed in HPI.   EP Information / Studies Reviewed:    EKG is ordered today. Personal review as below.  EKG Interpretation Date/Time:  Tuesday April 11 2023 10:38:38 EST Ventricular Rate:  63 PR Interval:  152 QRS Duration:  92 QT Interval:  402 QTC Calculation: 411 R Axis:   84  Text Interpretation: Normal sinus rhythm Septal infarct (cited on or before 15-Sep-2021) When compared with ECG of 14-Feb-2023 10:30, Nonspecific T wave abnormality, worse in Inferior leads Confirmed by Lesia Sharper [phone]) on 04/11/2023 10:45:41 AM    Arrhythmia History  S/p Ablation 01/03/2023  Echo 12/02/2022 LVEF 55%, Grade 1 DD  Physical Exam:   VS:  BP 108/62   Pulse 63   Ht 5' 4 (1.626 m)   Wt 164 lb 9.6 oz (74.7 kg)   SpO2 95%   BMI 28.25 kg/m    Wt Readings from Last 3 Encounters:  04/11/23 164 lb 9.6 oz (74.7 kg)  03/10/23 163 lb 12.8 oz (74.3 kg)  02/14/23 162 lb 9.6 oz (73.8 kg)     GEN: No acute distress NECK: No JVD; No carotid bruits CARDIAC: Regular rate and rhythm, no murmurs, rubs, gallops RESPIRATORY:  Clear to auscultation without rales, wheezing or rhonchi  ABDOMEN: Soft, non-tender, non-distended EXTREMITIES:  No edema; No deformity    ASSESSMENT AND PLAN:    Persistent Atrial Fibrillation EKG today shows NSR S/p ablation 01/03/23 by Dr. Norton Continue Eliquis  5 mg BID for CHA2DS2/VASc of at least 7 Stop amidoarone  Secondary hypercoagulable state Pt on Eliquis  as above     CAD Denies s/s ischemia  HFrecEF   Follow up with Dr. Norton in 6 months  Signed, Sharper Prentice Lesia, PA-C

## 2023-04-11 NOTE — Patient Instructions (Signed)
 Medication Instructions:  Stop amiodarone  *If you need a refill on your cardiac medications before your next appointment, please call your pharmacy*  Lab Work: None ordered If you have labs (blood work) drawn today and your tests are completely normal, you will receive your results only by: MyChart Message (if you have MyChart) OR A paper copy in the mail If you have any lab test that is abnormal or we need to change your treatment, we will call you to review the results.  Follow-Up: At [facility], you and your health needs are our priority.  As part of our continuing mission to provide you with exceptional heart care, we have created designated Provider Care Teams.  These Care Teams include your primary Cardiologist (physician) and Advanced Practice Providers (APPs -  Physician Assistants and Nurse Practitioners) who all work together to provide you with the care you need, when you need it.  Your next appointment:   6 month(s)  Provider:   Soyla Norton, MD or Ozell Jodie Passey, PA-C

## 2023-04-18 ENCOUNTER — Ambulatory Visit: Payer: Medicaid Other | Admitting: Cardiology

## 2023-06-21 ENCOUNTER — Ambulatory Visit: Payer: Medicaid Other | Attending: Cardiology | Admitting: Cardiology

## 2023-06-21 DIAGNOSIS — I5032 Chronic diastolic (congestive) heart failure: Secondary | ICD-10-CM

## 2023-06-21 DIAGNOSIS — I25118 Atherosclerotic heart disease of native coronary artery with other forms of angina pectoris: Secondary | ICD-10-CM

## 2023-06-21 DIAGNOSIS — I4819 Other persistent atrial fibrillation: Secondary | ICD-10-CM

## 2023-06-21 NOTE — Progress Notes (Unsigned)
 Rescheduled

## 2023-07-04 ENCOUNTER — Other Ambulatory Visit: Payer: Self-pay | Admitting: Cardiology

## 2023-08-10 ENCOUNTER — Ambulatory Visit: Admitting: Cardiology

## 2023-08-16 ENCOUNTER — Ambulatory Visit: Admitting: Cardiology

## 2023-08-31 ENCOUNTER — Other Ambulatory Visit: Payer: Self-pay | Admitting: Cardiology

## 2023-09-18 ENCOUNTER — Ambulatory Visit

## 2023-09-18 ENCOUNTER — Encounter: Payer: Self-pay | Admitting: Cardiology

## 2023-09-18 ENCOUNTER — Ambulatory Visit: Attending: Cardiology | Admitting: Cardiology

## 2023-09-18 VITALS — BP 116/58 | HR 65 | Ht 64.0 in | Wt 159.0 lb

## 2023-09-18 DIAGNOSIS — I4819 Other persistent atrial fibrillation: Secondary | ICD-10-CM | POA: Diagnosis not present

## 2023-09-18 DIAGNOSIS — I2510 Atherosclerotic heart disease of native coronary artery without angina pectoris: Secondary | ICD-10-CM | POA: Insufficient documentation

## 2023-09-18 DIAGNOSIS — R002 Palpitations: Secondary | ICD-10-CM | POA: Insufficient documentation

## 2023-09-18 MED ORDER — ASPIRIN 81 MG PO TBEC
81.0000 mg | DELAYED_RELEASE_TABLET | Freq: Every day | ORAL | 3 refills | Status: AC
Start: 2023-09-18 — End: ?

## 2023-09-18 NOTE — Progress Notes (Signed)
 Cardiology Office Note:  .   Date:  09/18/2023  ID:  Alexandra Ward, DOB 06/28/1960, MRN [id number] PCP: Theoplis Fix, MD  [facility] Providers Cardiologist:  Fransico Ivy, MD PCP: Theoplis Fix, MD  Chief Complaint  Patient presents with   Palpitations     Alexandra Ward is a 63 y.o. female with hyperlipidemia, type 2 DM, CAD-STEMI (2022), persistent A-fib  s/p ablation (2024), h/o stroke (vertebral artery dissection 2018), h/o HFrEF with recovered LVEF, h/o spontaneous Rt pneumothorax (2023)   History of Present Illness   Patient is staying active, enjoys swimming.  Denies any complains of chest pain or shortness of breath.  She has had at least couple of episodes of palpitations since March 2025.  This is first time she had palpitations since her ablation in 01/2023.     Vitals:   09/18/23 1032  BP: (!) 116/58  Pulse: 65  SpO2: 96%      Review of Systems  Cardiovascular:  Positive for palpitations. Negative for chest pain, dyspnea on exertion, leg swelling and syncope.        Studies Reviewed: Aaron Aas         Independently interpreted 09/2023: HbA1C 6%  12/2022: Cr 1.2   09/2021: Chol 132, TG 111, HDL 36, LDL 74 Hb 11.9  Echocardiogram 11/30/2022: Left ventricle cavity is normal in size. Normal left ventricular wall thickness. Apical anterior, apical lateral, apical inferior, apical septal and apical cap akinesis. Normal LV systolic function with EF 55%. Doppler evidence of grade I (impaired) diastolic dysfunction, normal LAP. Left atrial cavity is mildly dilated. Compared to previous study on 03/10/2021, LVEF has improved from 30-35%.    Coronary angiography 09/15/2021: LM: Normal LAD: Patent mid LAD stent (Placed 6.2022-Synergy 3.0X24 for STEMI) Lcx: No significant disease RCA: Mid 100% occlusion          Left-to-right collaterals   LVEDP 37 mmHg   No acute occlusion Presentation likely due to flash pulmonary edema    Risk  Assessment/Calculations:    CHA2DS2-VASc Score = 6   This indicates a 9.7% annual risk of stroke. The patient's score is based upon: CHF History: 0 HTN History: 1 Diabetes History: 1 Stroke History: 2 Vascular Disease History: 1 Age Score: 0 Gender Score: 1     Physical Exam Vitals and nursing note reviewed.  Constitutional:      General: She is not in acute distress. Neck:     Vascular: No JVD.   Cardiovascular:     Rate and Rhythm: Normal rate and regular rhythm.     Heart sounds: Normal heart sounds. No murmur heard. Pulmonary:     Effort: Pulmonary effort is normal.     Breath sounds: Normal breath sounds. No wheezing or rales.   Musculoskeletal:     Right lower leg: No edema.     Left lower leg: No edema.      VISIT DIAGNOSES: No diagnosis found.   Alexandra Ward is a 63 y.o. female with hyperlipidemia, type 2 DM, CAD-STEMI (2022), persistent A-fib  s/p ablation (2024), h/o stroke (vertebral artery dissection 2018), h/o HFrEF with recovered LVEF, h/o spontaneous Rt pneumothorax (2023) Assessment & Plan  CAD: No angina symptoms. Not on Aspirin  due to ongoing use of Eliquis .  I would prefer her to be on Aspirin  at least for another year or two (till 2027) given her LAD PCI in the setting of STEMI in 09/2020. If she has any bleeding issues on Aspirin  and Eliquis ,  could consider LAAAC closure.  She has been off GDMT for HFrEF, but EF recovered to 55% in 11/2022. Okay to stay off.  Persistent Afib: S/p ablation 01/2023. Recent palpitations. Recommend 30 day event monitor. Continue Eliquis  5 mg bid.  DM: Controlled with Farxia and Ozempic.       Meds ordered this encounter  Medications   aspirin  EC 81 MG tablet    Sig: Take 1 tablet (81 mg total) by mouth daily. Swallow whole.    Dispense:  90 tablet    Refill:  3     F/u in 6 months  Signed, Cody Das, MD

## 2023-09-18 NOTE — Patient Instructions (Signed)
 Medication Instructions:  START Aspirin  81 mg daily  *If you need a refill on your cardiac medications before your next appointment, please call your pharmacy*  Lab Work: Lipid Panel   If you have labs (blood work) drawn today and your tests are completely normal, you will receive your results only by: MyChart Message (if you have MyChart) OR A paper copy in the mail If you have any lab test that is abnormal or we need to change your treatment, we will call you to review the results.  Testing/Procedures: 30 Day Heart Monitor  Your physician has recommended that you wear an event monitor. Event monitors are medical devices that record the heart's electrical activity. Doctors most often us  these monitors to diagnose arrhythmias. Arrhythmias are problems with the speed or rhythm of the heartbeat. The monitor is a small, portable device. You can wear one while you do your normal daily activities. This is usually used to diagnose what is causing palpitations/syncope (passing out).   Follow-Up: At [facility], you and your health needs are our priority.  As part of our continuing mission to provide you with exceptional heart care, our providers are all part of one team.  This team includes your primary Cardiologist (physician) and Advanced Practice Providers or APPs (Physician Assistants and Nurse Practitioners) who all work together to provide you with the care you need, when you need it.  Your next appointment:   6 month(s)  Provider:   Cody Das, MD

## 2023-09-18 NOTE — Progress Notes (Unsigned)
 [employer] event serial # [id number] from office inventory applied to patient.

## 2023-09-19 ENCOUNTER — Ambulatory Visit: Payer: Self-pay | Admitting: Cardiology

## 2023-09-19 DIAGNOSIS — E782 Mixed hyperlipidemia: Secondary | ICD-10-CM

## 2023-09-19 LAB — LIPID PANEL
Chol/HDL Ratio: 4.2 ratio (ref 0.0–4.4)
Cholesterol, Total: 137 mg/dL (ref 100–199)
HDL: 33 mg/dL — ABNORMAL LOW (ref >39)
LDL Chol Calc (NIH): 78 mg/dL (ref 0–99)
Triglycerides: 151 mg/dL — ABNORMAL HIGH (ref 0–149)
VLDL Cholesterol Cal: 26 mg/dL (ref 5–40)

## 2023-09-19 NOTE — Progress Notes (Signed)
 LDL 78. With prior heart attack and h/o diabetes, recommend LDL <55. We could try adding Zetia 10 mg daily or injectable agents, whatever patient would prefer. Repeat lipid panel in 3 months.  Thanks MJP

## 2023-09-20 MED ORDER — EZETIMIBE 10 MG PO TABS: 10.0000 mg | ORAL_TABLET | Freq: Every day | ORAL | 3 refills | Status: AC

## 2023-09-20 NOTE — Telephone Encounter (Signed)
 Patient is calling requesting lab results. States she does not know how to use mychart. Please advise.

## 2023-10-05 ENCOUNTER — Telehealth: Payer: Self-pay | Admitting: Cardiology

## 2023-10-05 NOTE — Telephone Encounter (Signed)
 Spoke with the patient and reiterated discontinuation of Aspirin .  Please take aspirin  after this.  Thanks MJP

## 2023-10-05 NOTE — Telephone Encounter (Signed)
 Pt was returning nurse call and is requesting a callback. Please advise.

## 2023-10-05 NOTE — Telephone Encounter (Signed)
 Pt contacted and advised. She is going to continue her Zetia  and Lipitor . Pt will stop aspirin  and will contact office if symptoms continue or worsen.

## 2023-10-05 NOTE — Telephone Encounter (Signed)
 Pt c/o medication issue:  1. Name of Medication:   ezetimibe  (ZETIA ) 10 MG tablet    2. How are you currently taking this medication (dosage and times per day)?    3. Are you having a reaction (difficulty breathing--STAT)? no  4. What is your medication issue? Believes meds is causing her to be sick. Please advise

## 2023-10-05 NOTE — Telephone Encounter (Signed)
 Most likely, it is the Aspirin  that could be causing stomach problems.  I am okay for her to stop aspirin .   Thanks MJP

## 2023-10-05 NOTE — Telephone Encounter (Signed)
 Left message for patient to call back

## 2023-10-05 NOTE — Telephone Encounter (Signed)
 Patient stated she was started on 3 new medications. Aspirin , Zetia , and lipitor . Patient thinks one of these medications is causing her stomach upset. Encouraged patient to hold zetia  for a few days and see if her symptoms improve. Informed patient if symptoms persist she can hold her lipitor  to see if this is causing her to have these symptoms. Will forward to Dr. Elmira for further advisement.

## 2023-10-05 NOTE — Telephone Encounter (Signed)
 Called patient to let her know Dr. Gilda advisement. Patient does not want to hear from anyone except Dr. Elmira himself.

## 2023-10-10 ENCOUNTER — Telehealth: Payer: Self-pay | Admitting: Pharmacy Technician

## 2023-10-10 ENCOUNTER — Other Ambulatory Visit (HOSPITAL_COMMUNITY): Payer: Self-pay

## 2023-10-10 NOTE — Telephone Encounter (Signed)
 Pharmacy Patient Advocate Encounter  Received notification from [facility] that Prior Authorization for farxiga  10mg  has been APPROVED from 10/10/23 to 10/09/24   # [id number]   Sent mychart

## 2023-10-10 NOTE — Telephone Encounter (Signed)
 Pharmacy Patient Advocate Encounter   Received notification from Onbase that prior authorization for farxiga  is required/requested.   Insurance verification completed.   The patient is insured through [employer] .   Per test claim: PA required; PA submitted to above mentioned insurance via latent Key/confirmation #/EOC [id number] Status is pending

## 2023-10-22 DIAGNOSIS — R002 Palpitations: Secondary | ICD-10-CM | POA: Diagnosis not present

## 2024-02-22 ENCOUNTER — Ambulatory Visit: Admitting: Student

## 2024-03-26 IMAGING — DX DG CHEST 1V PORT
1 series · 1 of 1 positions shown · non-contrast
Comparison: 09/15/2021

CLINICAL DATA: Follow-up chest tube

EXAM:
PORTABLE CHEST 1 VIEW

[chest]
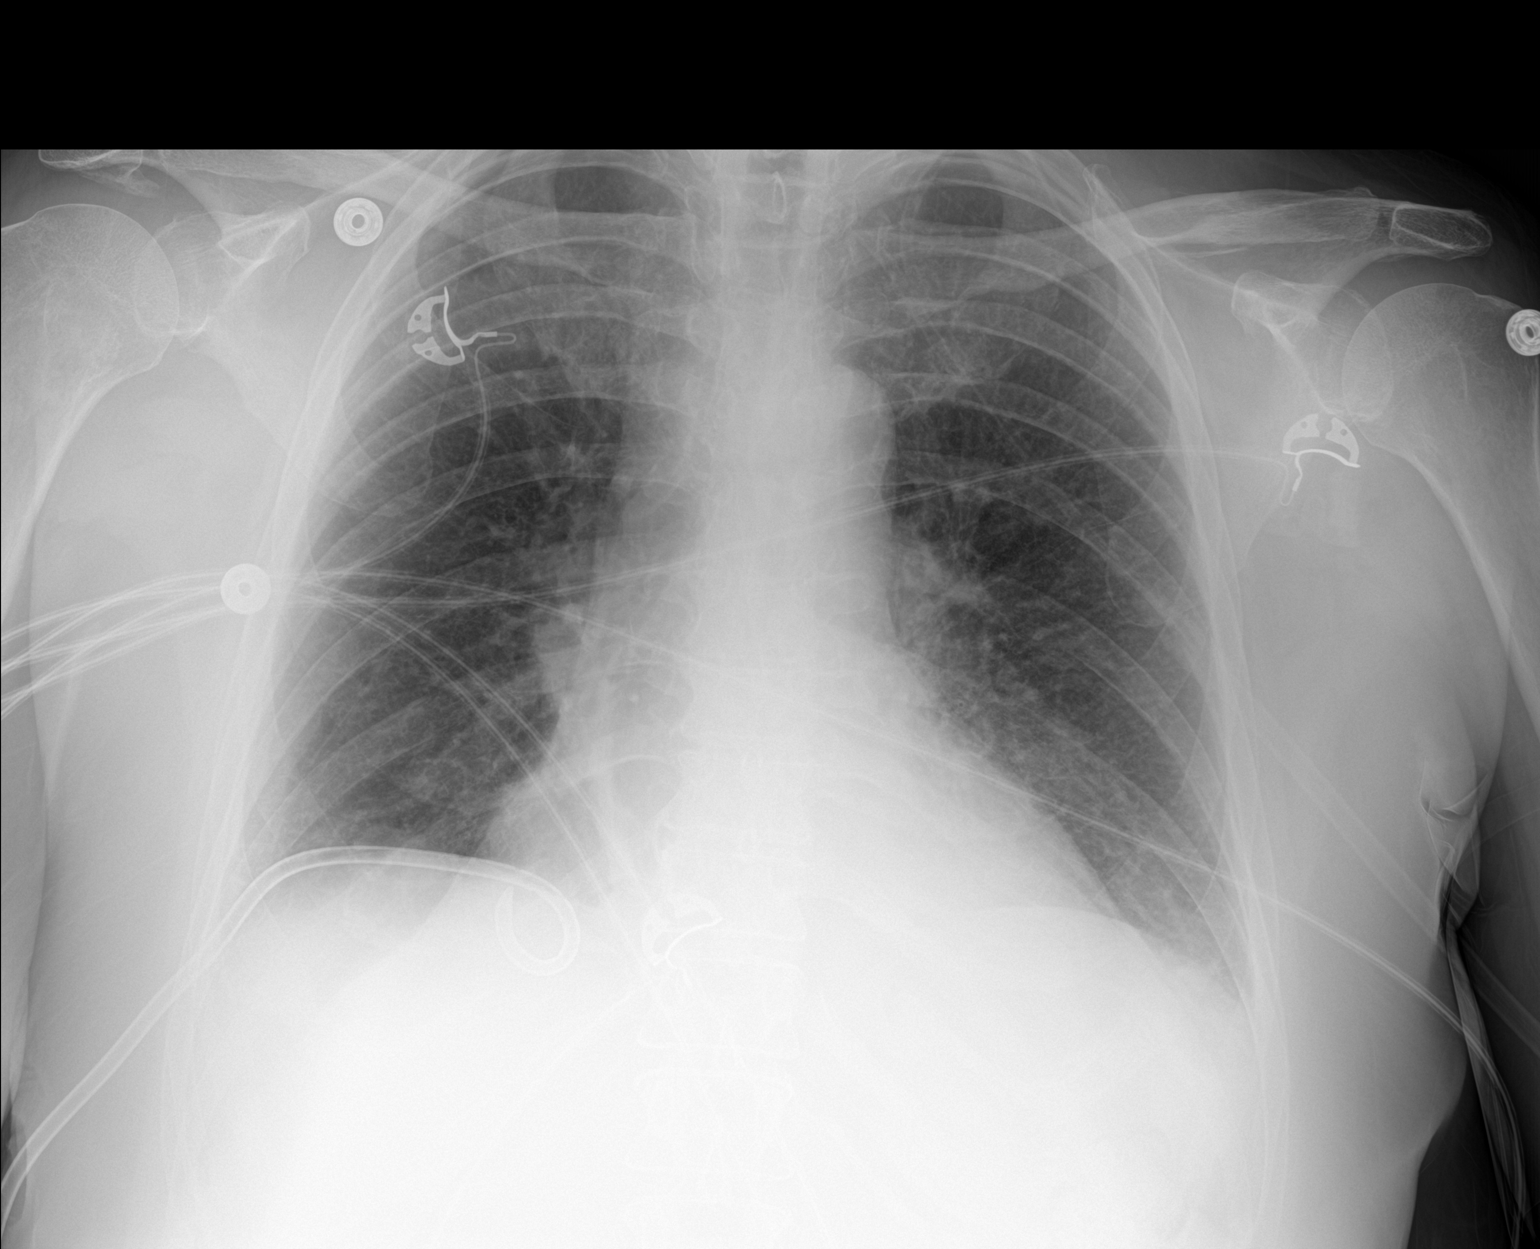

[1 of 1 positions shown; findings below may reference images not displayed]

FINDINGS: Heart and mediastinal shadows are normal. Chest tube remains in
place within the inferior pleural space on the right. Tiny amount of
pleural air at the apex is unchanged. Mild atelectasis at the lung
bases, slightly improved.
IMPRESSION: Right chest tube remains in place. Tiny amount of apical pleural air
is unchanged. Slight improvement in basilar aeration.

## 2024-03-26 IMAGING — DX DG CHEST 1V PORT
1 series · 1 of 1 positions shown · non-contrast
Comparison: Chest radiograph 09/16/2021 at [DATE] a.m.

CLINICAL DATA: Shortness of breath.

EXAM:
PORTABLE CHEST 1 VIEW

[chest ap]
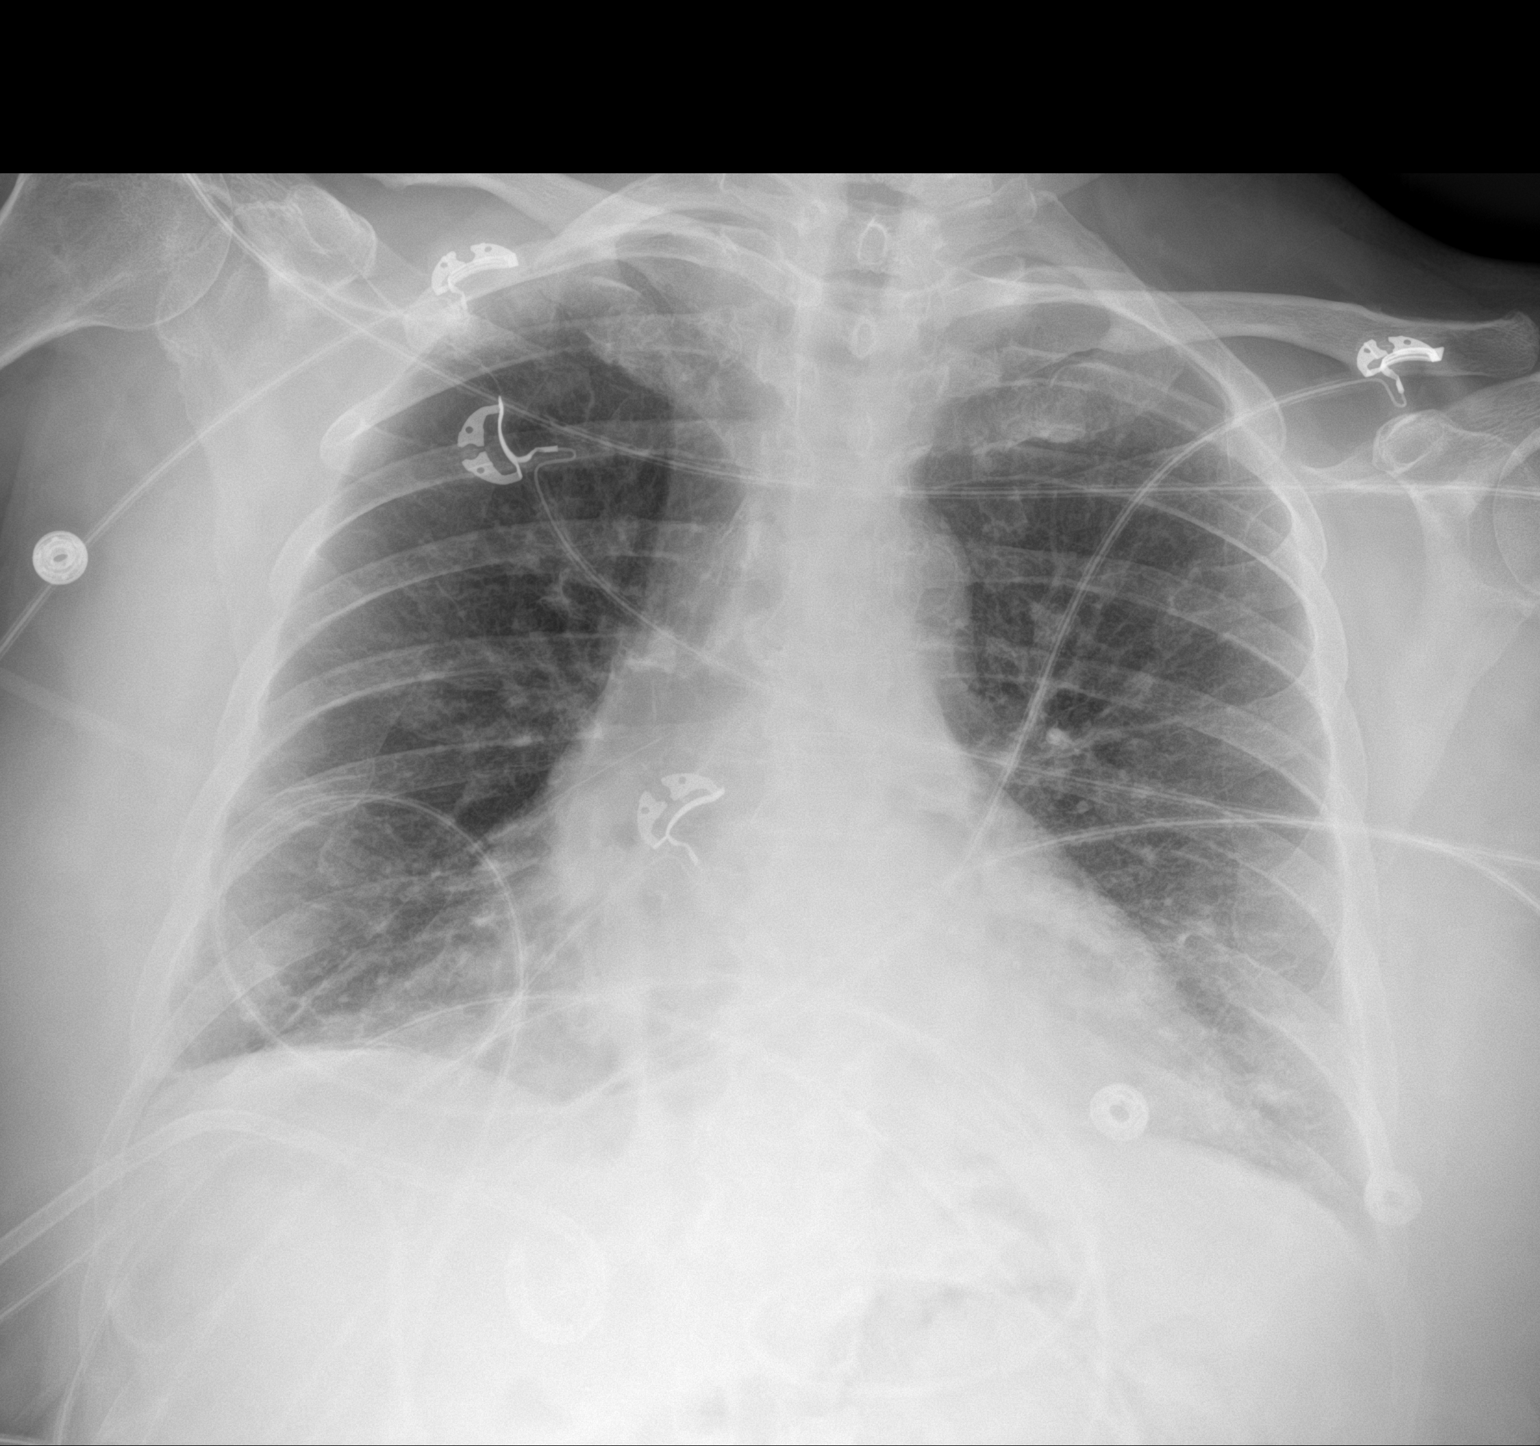

[1 of 1 positions shown; findings below may reference images not displayed]

FINDINGS: The cardiomediastinal silhouette is unchanged. A chest tube remains
in place at the right lung base. The small right apical pneumothorax
on the prior study has either decreased or resolved, with only a
questionable tiny pneumothorax being currently visible. Mild
bibasilar opacities are similar to the prior study and compatible
with atelectasis. No sizable pleural effusion is evident.
IMPRESSION: 1. Decreased or resolved tiny right apical pneumothorax.
2. Unchanged bibasilar atelectasis.

## 2024-04-11 ENCOUNTER — Ambulatory Visit: Admitting: Cardiology

## 2024-04-11 ENCOUNTER — Ambulatory Visit: Admitting: Student

## 2024-06-14 ENCOUNTER — Ambulatory Visit: Admitting: Cardiology

## 2024-06-14 ENCOUNTER — Ambulatory Visit: Admitting: Student
